# Patient Record
Sex: Male | Born: 1935
Health system: Southern US, Community
[De-identification: ages and names within clinical notes are randomized; demographics above are authoritative.]

## PROBLEM LIST (undated history)

## (undated) DIAGNOSIS — I350 Nonrheumatic aortic (valve) stenosis: Secondary | ICD-10-CM

## (undated) DIAGNOSIS — Z972 Presence of dental prosthetic device (complete) (partial): Secondary | ICD-10-CM

## (undated) DIAGNOSIS — I712 Thoracic aortic aneurysm, without rupture: Secondary | ICD-10-CM

## (undated) DIAGNOSIS — K08109 Complete loss of teeth, unspecified cause, unspecified class: Secondary | ICD-10-CM

## (undated) DIAGNOSIS — I359 Nonrheumatic aortic valve disorder, unspecified: Secondary | ICD-10-CM

## (undated) DIAGNOSIS — I38 Endocarditis, valve unspecified: Secondary | ICD-10-CM

## (undated) DIAGNOSIS — I10 Essential (primary) hypertension: Secondary | ICD-10-CM

## (undated) DIAGNOSIS — R001 Bradycardia, unspecified: Secondary | ICD-10-CM

## (undated) DIAGNOSIS — M1990 Unspecified osteoarthritis, unspecified site: Secondary | ICD-10-CM

## (undated) DIAGNOSIS — E871 Hypo-osmolality and hyponatremia: Secondary | ICD-10-CM

## (undated) DIAGNOSIS — Z8719 Personal history of other diseases of the digestive system: Secondary | ICD-10-CM

## (undated) DIAGNOSIS — D649 Anemia, unspecified: Secondary | ICD-10-CM

## (undated) DIAGNOSIS — Z862 Personal history of diseases of the blood and blood-forming organs and certain disorders involving the immune mechanism: Secondary | ICD-10-CM

## (undated) DIAGNOSIS — N280 Ischemia and infarction of kidney: Secondary | ICD-10-CM

## (undated) DIAGNOSIS — G4733 Obstructive sleep apnea (adult) (pediatric): Secondary | ICD-10-CM

## (undated) DIAGNOSIS — E785 Hyperlipidemia, unspecified: Secondary | ICD-10-CM

## (undated) DIAGNOSIS — R011 Cardiac murmur, unspecified: Secondary | ICD-10-CM

## (undated) DIAGNOSIS — T826XXA Infection and inflammatory reaction due to cardiac valve prosthesis, initial encounter: Secondary | ICD-10-CM

## (undated) HISTORY — DX: Infection and inflammatory reaction due to cardiac valve prosthesis, initial encounter: T82.6XXA

## (undated) HISTORY — DX: Thoracic aortic aneurysm, without rupture: I71.2

## (undated) HISTORY — DX: Personal history of diseases of the blood and blood-forming organs and certain disorders involving the immune mechanism: Z86.2

## (undated) HISTORY — DX: Endocarditis, valve unspecified: I38

## (undated) HISTORY — DX: Hypo-osmolality and hyponatremia: E87.1

## (undated) HISTORY — PX: JOINT REPLACEMENT: SHX530

## (undated) HISTORY — PX: TOTAL KNEE ARTHROPLASTY: SHX125

## (undated) HISTORY — PX: CARDIAC VALVE REPLACEMENT: SHX585

## (undated) HISTORY — DX: Hyperlipidemia, unspecified: E78.5

## (undated) HISTORY — PX: POSTERIOR FUSION CERVICAL SPINE: SUR628

## (undated) HISTORY — DX: Nonrheumatic aortic (valve) stenosis: I35.0

## (undated) HISTORY — DX: Unspecified osteoarthritis, unspecified site: M19.90

## (undated) HISTORY — PX: CHOLECYSTECTOMY OPEN: SUR202

## (undated) HISTORY — DX: Essential (primary) hypertension: I10

## (undated) HISTORY — DX: Personal history of other diseases of the digestive system: Z87.19

## (undated) HISTORY — DX: Bradycardia, unspecified: R00.1

## (undated) HISTORY — PX: APPENDECTOMY: SHX54

---

## 2001-12-23 ENCOUNTER — Encounter: Payer: Self-pay | Admitting: Orthopedic Surgery

## 2001-12-29 ENCOUNTER — Encounter: Payer: Self-pay | Admitting: Orthopedic Surgery

## 2001-12-29 ENCOUNTER — Inpatient Hospital Stay (HOSPITAL_COMMUNITY): Admission: RE | Admit: 2001-12-29 | Discharge: 2002-01-03 | Payer: Self-pay | Admitting: Orthopedic Surgery

## 2006-06-26 ENCOUNTER — Ambulatory Visit (HOSPITAL_COMMUNITY): Admission: RE | Admit: 2006-06-26 | Discharge: 2006-06-26 | Payer: Self-pay | Admitting: Orthopedic Surgery

## 2007-01-13 ENCOUNTER — Inpatient Hospital Stay (HOSPITAL_COMMUNITY): Admission: RE | Admit: 2007-01-13 | Discharge: 2007-01-19 | Payer: Self-pay | Admitting: Orthopedic Surgery

## 2010-08-15 ENCOUNTER — Encounter
Admission: RE | Admit: 2010-08-15 | Discharge: 2010-08-15 | Payer: Self-pay | Source: Home / Self Care | Attending: Orthopedic Surgery | Admitting: Orthopedic Surgery

## 2010-08-20 ENCOUNTER — Inpatient Hospital Stay (HOSPITAL_COMMUNITY)
Admission: RE | Admit: 2010-08-20 | Discharge: 2010-08-22 | Payer: Self-pay | Source: Home / Self Care | Attending: Orthopedic Surgery | Admitting: Orthopedic Surgery

## 2010-11-11 LAB — BASIC METABOLIC PANEL
CO2: 29 mEq/L (ref 19–32)
Calcium: 8.3 mg/dL — ABNORMAL LOW (ref 8.4–10.5)
GFR calc Af Amer: >60 mL/min (ref >60)
GFR calc non Af Amer: >60 mL/min (ref >60)
Sodium: 139 mEq/L (ref 135–145)

## 2010-11-11 LAB — CBC
Hemoglobin: 11.8 g/dL — ABNORMAL LOW (ref 13.0–17.0)
MCH: 34 pg (ref 26.0–34.0)
MCV: 92.7 fL (ref 78.0–100.0)
Platelets: 161 10*3/uL (ref 150–400)
RBC: 3.54 MIL/uL — ABNORMAL LOW (ref 4.22–5.81)
RDW: 12.6 % (ref 11.5–15.5)
WBC: 5.2 10*3/uL (ref 4.0–10.5)

## 2010-11-11 LAB — DIFFERENTIAL
Basophils Absolute: 0.1 10*3/uL (ref 0.0–0.1)
Lymphocytes Relative: 23 % (ref 12–46)
Lymphs Abs: 1.2 10*3/uL (ref 0.7–4.0)
Monocytes Absolute: 0.6 10*3/uL (ref 0.1–1.0)
Monocytes Relative: 11 % (ref 3–12)
Neutro Abs: 3 10*3/uL (ref 1.7–7.7)

## 2010-11-11 LAB — ABO/RH: ABO/RH(D): O POS

## 2010-11-11 LAB — URINALYSIS, ROUTINE W REFLEX MICROSCOPIC
Ketones, ur: NEGATIVE mg/dL
Leukocytes, UA: NEGATIVE
Nitrite: NEGATIVE
Specific Gravity, Urine: 1.015 (ref 1.005–1.030)
pH: 6.5 (ref 5.0–8.0)

## 2010-11-11 LAB — COMPREHENSIVE METABOLIC PANEL
Albumin: 3.8 g/dL (ref 3.5–5.2)
BUN: 16 mg/dL (ref 6–23)
Calcium: 9.5 mg/dL (ref 8.4–10.5)
Creatinine, Ser: 0.9 mg/dL (ref 0.4–1.5)
Total Protein: 6.8 g/dL (ref 6.0–8.3)

## 2010-11-11 LAB — URINE MICROSCOPIC-ADD ON

## 2010-11-11 LAB — SURGICAL PCR SCREEN: Staphylococcus aureus: POSITIVE — AB

## 2010-11-11 LAB — APTT: aPTT: 30 seconds (ref 24–37)

## 2011-01-17 NOTE — H&P (Signed)
NAME:  Nicholas Walls, Nicholas Walls             ACCOUNT NO.:  [id number]   MEDICAL RECORD NO.:  [id number]           PATIENT TYPE:   LOCATION:                                 FACILITY:   PHYSICIAN:  Georges Lynch. Gioffre, M.D.DATE OF BIRTH:  1936-08-19   DATE OF ADMISSION:  01/13/2007  DATE OF DISCHARGE:                              HISTORY & PHYSICAL   HISTORY OF PRESENT ILLNESS:  The patient is a 75 year old gentleman here  today for preadmission history and physical for his left knee.  The  patient has end-stage osteoarthritis.  He has a significant amount of  pain with range of motion.  He has difficulty with range of motion, he  cannot actively fully extend his knee, due to grinding and discomfort.  He is having quite a bit of pain.  He has failed cortisone injection and  conservative treatment, and would like to proceed with a left total knee  arthroplasty by Dr. Darrelyn Hillock.  X-rays reveal that he has calcific bodies  throughout the joint spaces and mostly in the suprapatellar region.  He  has bone-on-bone at the patellofemoral component with loss of medial and  lateral joint spacing.   ALLERGIES:  NO KNOWN DRUG ALLERGIES.   CURRENT MEDICATIONS:  1. Cozaar 50 mg a day.  2. Simvastatin 20 mg a day.  3. Tandem 1 tablet a day.  4. Aleve 2 tablets three times a day.  5. Buffered aspirin once a day.  6. Fish oil.   PAST MEDICAL HISTORY:  1. Hypertension.  2. Aortic valve stenosis.  3. History of hiatal hernia.  4. Reported chronic anemia.   REVIEW OF SYSTEMS:  Negative for any neurologic, pulmonary, GI, GU or  endocrine issues not noted above.   PHYSICIANS:  1. Primary care physician is Jethro Bastos, M.D.  2. Cardiologist is Rosine Abe, M.D.   FAMILY MEDICAL HISTORY:  Father is deceased from complications related  to lung cancer at 91 years of age.  Mother is deceased from  complications of throat cancer at 73 years of age.  The patient's  sisters have hypertension and  melanoma.   SOCIAL HISTORY:  The patient is married.  He is semi-retired.  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  He does not smoke.  He drinks 2-3 beers a day.  Has three grown children.  Lives with his  wife is going to take care of him afterwards, in a two-story house with  11 steps to the main entrance.   PHYSICAL EXAMINATION:  VITALS:  Height is 6 feet 3 inches, weight is  239, blood pressure is 150/72, pulse is 76 and regular, respirations are  12 and patient is afebrile.  GENERAL:  This is a healthy-appearing gentleman, conscious, alert and  appropriate, easily gets on and off the exam table.  He does walk with a  significant left-sided limp.  HEENT:  Head was normocephalic.  Pupils equal, round and reactive.  Extraocular movement is intact.  Oral buccal mucosa is pink and moist.  NECK:  Supple.  No palpable lymphadenopathy.  Good range of motion.  CHEST:  Lung sounds were clear and equal bilaterally.  No wheezes, rales  or rhonchi.  HEART:  Regular rate and rhythm.  He did have a systolic ejection  murmur.  ABDOMEN:  Soft.  Bowel sounds present.  UPPER EXTREMITIES:  Upper extremities were symmetrically sized and  shaped with good range of motion of the shoulders, elbows and wrists.  LOWER EXTREMITIES:  Right and left hip had full extension, flexion up to  130, 20 degrees internal-external rotation without any discomfort.  Right knee - he could easily fully extend and flex back to about 110  degrees.  He had no instability.  The calf was soft and nontender.  Left  knee - he had difficulty getting it up about 45 degrees short of full  extension, but passively extended to about loss of 5 degrees.  He could  flex it back to about 100 degrees.  He has significant crepitus in the  knee with range of motion.  He had no instability.  Calf was soft and  nontender.  Both ankles were symmetrical with good dorsi-plantar  flexion.  PERIPHERAL VASCULAR:  Carotid pulses  were 2+ with no bruits.  Radial  pulses were 2+.  Dorsalis pedis pulses were 2+.  He had no lower  extremity edema.  NEUROLOGIC:  The patient was conscious, alert and appropriate, a good  historian.  He had no gross neurologic defects noted.  BREAST, RECTAL AND GU:  Exams were deferred at this time.   IMPRESSION:  1. End-stage osteoarthritis, left knee.  2. Hypertension.  3. History of aortic valve stenosis.  4. History of hiatal hernia.  5. History of chronic anemia.   PLAN:  The patient has been seen by Dr. Reyes Ivan and has received cardiac  clearance; this information was forwarded to the hospital.  The patient  will undergo all other routine labs and tests prior to having a left  total knee arthroplasty by Dr. Darrelyn Hillock at [facility] on Jan 13, 2007.      Jamelle Rushing, P.A.    ______________________________  Georges Lynch Darrelyn Hillock, M.D.    RWK/MEDQ  D:  12/29/2006  T:  12/29/2006  Job:  [id number]

## 2011-01-17 NOTE — Op Note (Signed)
NAME:  Nicholas Walls, Nicholas Walls             ACCOUNT NO.:  [id number]   MEDICAL RECORD NO.:  [id number]          PATIENT TYPE:  AMB   LOCATION:  DAY                          FACILITY:  [facility]   PHYSICIAN:  Georges Lynch. Gioffre, M.D.DATE OF BIRTH:  01-Oct-1935   DATE OF PROCEDURE:  06/26/2006  DATE OF DISCHARGE:                                 OPERATIVE REPORT   SURGEON:  Georges Lynch. Gioffre, M.D.   ASSISTANT:  Nurse   PREOPERATIVE DIAGNOSIS:  1. Severe degenerative arthritis left knee.  2. Questionable loose bodies versus loose bodies affixed in the synovium      left knee.  3. Torn medial meniscus left knee.   POSTOPERATIVE DIAGNOSIS:  1. Severe degenerative arthritis left knee.  2. Loose bodies were not loose in the knee and they were affixed to the      synovium left knee.  3. Torn medial meniscus left knee.   PROCEDURE:  Under general anesthesia, routine orthopedic prepping and  draping of the left lower extremity was carried out.  She had 1 gram IV  Ancef.  At this time, a small punctate incision was made in the  suprapatellar pouch and an inflow cannula was inserted and the knee was  distended with saline.  At this time, a small punctate incision was made in  the anterolateral joint.  The arthroscope was entered from the lateral  approach and a complete diagnostic arthroscopy was carried out.  He had  severe synovitis of the suprapatellar pouch.  I introduced the reduced the  [employer] Bovie and literally up bladed the synovium.  I did look for loose  bodies, there really were no real loose bodies in the joint.  These actually  were in the synovium.  I then noted severe chondromalacia of the patella,  came down the lateral joint.  He had a torn lateral meniscus.  I had to do a  lateral meniscectomy.  I introduced a shaver suction device from the lateral  approach.  He had severe arthritic changes in the lateral joint.  I also did  a synovectomy.  The cruciates were intact.  I went over  to the medial joint.  The medial joint showed that he basically had a severe tear of the medial  meniscus posterior horn.  I introduced a shaver suction device and did a  medial meniscectomy.  I also did a synovectomy, as well.  I did an abrasion  chondroplasty of the medial femoral condyle.  I thoroughly irrigated out the  knee and closed all three punctate incisions with 3-0 nylon suture.  I  injected 30 mL 0.5% Marcaine and epinephrine in the knee joint.  A sterile  Neosporin dressing was applied.  Prior to surgery, he had 1 gram of IV  Ancef.  Postoperative, he will get another gram of IV Ancef before he is  discharged because of the total knee on the opposite side.  I will also put  him on aspirin 325 mg b.i.d. today and for two weeks as an anticoagulant.  He will be on crutches, partial weight bearing as tolerated, and I will  see  him in 10-12 days in the office.  He eventually is going to need a left  total knee.           ______________________________  [name], M.D.    RAG/MEDQ  D:  06/26/2006  T:  06/27/2006  Job:  [id number]

## 2011-01-17 NOTE — Discharge Summary (Signed)
NAME:  Nicholas Walls, Nicholas Walls             ACCOUNT NO.:  [id number]   MEDICAL RECORD NO.:  [id number]          PATIENT TYPE:  INP   LOCATION:  1617                         FACILITY:  [facility]   PHYSICIAN:  Georges Lynch. Gioffre, M.D.DATE OF BIRTH:  September 21, 1935   DATE OF ADMISSION:  01/13/2007  DATE OF DISCHARGE:  01/19/2007                               DISCHARGE SUMMARY   ADMISSION DIAGNOSES:  1. End-stage osteoarthritis left knee.  2. Hypertension.  3. History of aortic valve stenosis.  4. History of hiatal hernia.  5. History of chronic anemia.   DISCHARGE DIAGNOSES:  1. Left total knee arthroplasty.  2. Postoperative blood loss anemia allowed to self-correct with oral      supplements, no blood transfusions.  3. Mild hyponatremia, allowed to self-correct, asymptomatic.  4. Severe postsurgical hemarthrosis with superficial skin pressure      blisters, treated with intravenous antibiotics.  5. History of hypertension.  6. History of aortic valve stenosis.  7. History of hiatal hernia.  8. History of chronic anemia.   HISTORY OF PRESENT ILLNESS:  The patient is a 75 year old gentleman with  chronic issues related to his left knee due to severe arthritis.  The  patient has end-stage osteoarthritis on x-rays, near bone-on-bone  throughout.  He has painful range of motion.  The patient indicates he  would like to proceed with a total knee arthroplasty.   ALLERGIES:  No known drug allergies.   CURRENT MEDICATIONS:  1,  Cozaar 50 mg a day.  1. Simvastatin 20 mg a day.  2. Tandem one tablet a day.  3. Aleve two tablets three times a day p.r.n.  4. Buffered aspirin 81 mg a day.  5. Fish oil.   SURGICAL PROCEDURES:  On Jan 13, 2007, the patient was taken to the OR  by Dr. Worthy Rancher, assisted by Oneida Alar, PA-C.  Under general  anesthesia the patient underwent a left total knee arthroplasty.  There  were no complications.  The patient tolerated the procedure well.  He  had the following  components implanted:  A size 4 left femoral  component, a size 4 keeled tibial tray, a size 4 10-mm polyethylene  bearing, a size 41-mm three-peg patella.  All components were implanted  with polymethyl methacrylate and vancomycin impregnation.  The patient  was transferred to the recovery room for routine total knee protocol.   CONSULTS:  Routine consults requested:  Physical therapy, case  management, pharmacy for Coumadin dosing.   HOSPITAL COURSE:  On Jan 13, 2007, the patient was admitted to [facility] under the care of Dr. Worthy Rancher.  The patient was taken  to the OR where a left total knee arthroplasty was performed without any  complications.  The patient tolerated the procedure well, was  transferred to room and then to the orthopedic floor in good condition  to follow the total knee protocol with IV antibiotics, pain medications,  and Coumadin and heparin for DVT prophylaxis.  The patient spent about a  5-day postoperative course in which the patient did develop some mild  postoperative blood loss anemia  with a significant hemarthrosis and  pressure blisters about knee.  Due to this problem, the patient was  restarted on IV antibiotics and then placed on p.o. antibiotics to go  home to prevent any superficial cellulitis around a new total knee  arthroplasty.  The patient continued to work with physical therapy.  He  was allowed to have his hemoglobin correct with just p.o. supplements.  His vital signs remained stable.  He tolerated it well.  The patient  also developed some mild postoperative hyponatremia.  This was self-  corrected after discontinuing hydration with IV antibiotics.  The  patient was asymptomatic.  The patient's wound otherwise remained benign  for any signs of infection.  He did have a significant amount of  pressure blisters about the knee that were dressed on a daily basis to  prevent further infection, along with p.o. antibiotics.  The  patient  continued with physical therapy.  It was felt on postoperative day #6  that he was orthopedically and medically stable, ready for discharge  home.  He was discharged home in good condition with outpatient physical  therapy.   LABORATORIES:  CBC on admission found wbc's 4.6, hemoglobin 14.9,  hematocrit 42.7, platelets 176.  On Jan 15, 2007, wbc's were 7.8,  hemoglobin 9.9, hematocrit 28.1, platelets 112.  The patient was  tolerating it well with vital signs and activity level with p.o.  supplements to self-correct.  INR was 3.2 on May 20 and this was  corrected with adjustments of his Coumadin dosing.  This possibly could  have contributed to his significant hemarthrosis.  Routine chemistries  on May 16 found sodium 133, potassium of 3.8, glucose 115, BUN 10,  creatinine 0.89.  Estimated GFR was greater than 60 throughout his  hospitalization.  Urinalysis on admission was no signs of bacteria.  Chest x-ray on admission found stable cardiomegaly and COPD.   DISCHARGE INSTRUCTIONS:  1. Activity:  The patient can slowly increase activity with the use of      a walker, may weight-bear as tolerated.  2. Diet:  No restrictions.  3. Wound care:  The patient is to change his dressing daily with      Adaptic dressings as directed.   FOLLOWUP:  The patient needs a followup appointment Dr. Darrelyn Hillock in 2  weeks from date of discharge from hospital.  The patient is to call [phone] for this appointment.   MEDICATIONS:  1. Percocet 10/650 one tablet every 4-6 hours for pain if needed.  2. Robaxin 500 mg one tablet every 6 hours for muscle spasms if      needed.  3. Coumadin 5 mg one tablet a day as directed by the pharmacy.  4. Keflex 500 mg one tablet four times a day.  5. Cozaar 50 mg a day.  6. Simvastatin 20 mg once a day.  7. Tandem one tablet once a day.  8. Aspirin 325 mg to resume after completed Coumadin, which will be     for 30 days from surgery.  9. Aleve - he is not  to continue at this time.   CONDITION UPON DISCHARGE TO HOME:  Listed as improved and good.      Jamelle Rushing, P.A.    ______________________________  Georges Lynch Darrelyn Hillock, M.D.    RWK/MEDQ  D:  01/27/2007  T:  01/27/2007  Job:  [id number]

## 2011-01-17 NOTE — Discharge Summary (Signed)
[facility]  Patient:    DEMBA, NIGH Visit Number: [id number] MRN: [id number]          Service Type: SUR Location: 4W 0467 01 Attending Physician:  Skip Mayer Dictated by:   Alexzandrew L. Perkins, P.A.-C. Admit Date:  12/29/2001 Discharge Date: 01/03/2002                             Discharge Summary  ADMISSION DIAGNOSES: 1. Right knee osteoarthritis. 2. Mild sleep apnea.  DISCHARGE DIAGNOSES: 1. Degenerative arthritis, right knee, with genu varus deformity status post    right total knee replacement arthroplasty. 2. Right knee osteoarthritis. 3. Mild sleep apnea.  PROCEDURES:  The patient was taken to OR on December 29, 2001, and underwent a right total knee replacement arthroplasty.  The surgeon was Georges Lynch. Darrelyn Hillock, M.D., assistant was Javier Docker, M.D.  Components used were Osteonics. All were cemented.  Patella size 26, femur size 9 right, tibial tray size 9, tibial insert 10 mm thickness.  General anesthesia.  CONSULTATIONS:  None.  BRIEF HISTORY:  The patient is a 75 year old male who had progressive pain and deformity affecting his right knee for several years now.  The pain has been quite significant and progressed to the point where it has interfered with his daily activity.  He is quite active and wishes to continue with his active lifestyle.  It was felt he would benefit from a total knee replacement.  Risks and benefits have been discussed, and the patient has elected to proceed with surgery.  LABORATORY DATA:  CBC on admission: Hemoglobin 14.8, hematocrit 41.4, white blood cell count 4.2, red cell count 4.49, differential within normal limits with the exception of slight elevation in eosinophils of 8.  Postop hemoglobin 12.9, hematocrit 36.3; last noted hemoglobin 12.6, hematocrit 35.1.  PT/PTT on admission 30.7 and 31, respectively with INR 1.1.  Pro time will be followed according to Coumadin protocol.   Last noted PT and INR were therapeutic at 23.4 with an INR of 2.4.  Chemistry panel on admission all within normal limits.  Urinalysis was negative.  Blood group type O positive.  Preoperative chest x-ray showed COPD and cardiomegaly, enlargement of central pulmonary arteries noted, likely on the basis of underlying pulmonary hypertension.  No acute findings.  Right knee film shows severe osteoarthritis with near complete loss of medial joint space.  In standing position, there is also evidence of patella alta and patellofemoral disease.  Postop knee films: Good position right total knee prosthesis in AP and lateral projections.  No evidence of acute fracture.  Single large superolateral surgical drain.  EKG dated December 22, 2001: Normal sinus rhythm, left atrial enlargement, no old tracing to compare.  Confirmed by Daisey Must, M.D.  HOSPITAL COURSE:  The patient was admitted to [facility] and taken the OR where he underwent the above-stated procedure without complication. The patient tolerated the procedure well and was later transferred to the recovery room and to the floor for continued postoperative care.  Hemovac drain was placed at the time of surgery.  The patient was placed on heparin and Coumadin postop.  The patient was given 48 hours postop antibiotics, placed on weightbearing-as-tolerated status.  The Hemovac drain was pulled on postop day #1 without difficulty.  CPM was started postop, and patient underwent PT and OT evaluation for continued protocol.  The patient progressed fairly well with physical therapy, up ambulating  18 feet by postop day #1 and progressed to greater than 110 feet by postop day #4.  Dressing changes were initiated on postop day #2.  Wound was healing well.  The patient had some swelling in and about the distal incision with only some minimal erythema.  There were no signs of obvious infection. Staples were intact.  The patient  continued to progress very well up until Jan 03, 2002, postop day #5.  The patient was up ambulating well, had been weaned off PCA analgesics and over to p.o. analgesics.  IV was discontinued.  He was voiding well, and it was thought the patient could be discharged home.  DISCHARGE PLAN: 1. The patient was discharged home on Jan 03, 2002. 2. For Discharge Diagnoses, please see above.  DISCHARGE MEDICATIONS: 1. Colace 100 mg, #60, b.i.d. 2. Trinsicon, #60, b.i.d. 3. Coumadin as per pharmacy protocol. 4. Percocet 5 mg, #40, 1 or 2 every 4 to 6 hours p.r.n. pain. 5. Robaxin 500 mg 1 every 6 to 8 hours p.r.n. spasm. 6. Keflex 500 mg, #20, p.o. q.i.d. for 5 days.  DIET:   As tolerated.  ACTIVITY:  Weightbearing as tolerated to right lower extremity.  Genevieve Norlander for home health PT and home health nursing and total knee protocol.  WOUND CARE:  Daily dressing changes.  FOLLOWUP:  With Dr. Darrelyn Hillock in the office two weeks from surgery.  DISPOSITION:  Home.  CONDITION UPON DISCHARGE:  Improved. Dictated by:   Alexzandrew L. Perkins, P.A.-C. Attending Physician:  Skip Mayer DD:  01/03/02 TD:  01/05/02 Job: [id number]

## 2011-01-17 NOTE — Op Note (Signed)
[facility]  Patient:    Nicholas Walls, Nicholas Walls Visit Number: [id number] MRN: [id number]          Service Type: SUR Location: 1S X002 01 Attending Physician:  Skip Mayer Dictated by:   Georges Lynch Darrelyn Hillock, M.D. Proc. Date: 12/29/01 Admit Date:  12/29/2001                             Operative Report  SURGEON:  Windy Fast A. Darrelyn Hillock, M.D.  ASSISTANT:  Javier Docker, M.D.  PREOPERATIVE DIAGNOSIS:  Severe degenerative arthritis, right knee with a genu varus deformity.  POSTOPERATIVE DIAGNOSIS:  Severe degenerative arthritis, right knee with a genu varus deformity.  OPERATION:  Right total knee arthroplasty utilizing the Osteonics system.  I utilized the posterior cruciate-sacrificing prosthesis.  All three components were cemented.  The sizes used were as follows: 1. The patella was a size 26. 2. The femur was a size 9, right. 3. The tibial tray was a size 9. 4. The tibial insert was a size 10 mm thickness insert.  DESCRIPTION OF PROCEDURE:  Under general anesthesia, a routine orthopedic prep and draping of the right lower extremity was carried out.  The leg was exsanguinated with an Esmarch.  Tourniquet was elevated at 350 mmHg.  An incision was made over the anterior aspect of the right knee, bleeders identified and cauterized.  Two flaps were created and sutured down in place. I then carried out a right median parapatellar incision.  I reflected the patella laterally, flexed the knee, and did medial and medial and lateral meniscectomies, and removed the anterior and posterior cruciate ligaments. Following this, I did a synovectomy.  I flexed the knee, made my initial drill hole in the intercondylar notch and the guide rod was inserted and, at this time, 10 mm thickness was removed from the distal femur.  The size 9 jig was then utilized to make our anterior, posterior, and chamfering cuts for a size 9 right femur.  Once the femur was  prepared, we then prepared the tibial plateau.  We removed 6 mm thickness off the tibial plateau.  We did this my utilizing intramedullary guide rod.  Following this, we removed 26 mm thickness off the patella.  For the size 26 patella, we made a drill hole and removed approximately 10 mm thickness from the internal portion of the patella.  Three drill holes then were made.  After that, we flexed the knee, and we cut our keel cut.  Note, that prior to doing that, we went through our trials and felt that a size 9 right femur, a size 9 tibial tray, and 10 mm thickness was our most stable.  We then thoroughly irrigated out the knee, cut our keel cut.  We then dried the knee out and then inserted all three components in simultaneously.  We cemented all three components.  We went through trials again and felt that a 10 mm thickness trial was the most stable.  Following that, we then removed the trial, removed all loose pieces of cement, bone wax to raw bone ends.  Note, I did utilize Gelfoam down into the femur and up into the femoral canal and down in the tibial plateau region to prevent the cement from migrating.  We then thoroughly water-picked the knee out, and we inserted about 16 cc of 0.5% Marcaine with epinephrine into the knee joint, closed the wound in layers in the usual  fashion over a Hemovac drain.  Sterile dressings were applied.  He had 1 g of IV Ancef preop. Dictated by:   Georges Lynch Darrelyn Hillock, M.D. Attending Physician:  Skip Mayer DD:  12/29/01 TD:  12/29/01 Job: [id number]

## 2011-01-17 NOTE — H&P (Signed)
[facility]  Patient:    Nicholas Walls, Nicholas Walls Visit Number: [id number] MRN: [id number]          Service Type: Attending:  Georges Walls. Nicholas Walls, M.D. Dictated by:   Nicholas Walls, P.A.                           History and Physical  DATE OF BIRTH:  [birth year].  CHIEF COMPLAINT:  Right knee pain.  HISTORY OF PRESENT ILLNESS:  Nicholas Walls is a 75 year old male who has had progressive pain and deformity affecting his right knee for several years duration.  He is now having significant pain that affects his activities of daily living.  He is quite active and has considered a total knee and is actually ready for it at this time.  His insurance company recently changed; therefore, he has been referred down to our practice.  His daughter actually lives in this city.  At this time risks, benefits discussed with the patient at length for right total knee arthroplasty, and he wished to proceed.  PAST MEDICAL HISTORY:  He has a history of mild sleep apnea, not currently using the CPAP machine as he does not tolerate this.  PAST SURGICAL HISTORY:  Cholecystectomy.  SOCIAL HISTORY:  No tobacco, no alcohol use except for very remotely.  He lives in a one-level home.  Has a wife to assist postoperatively.  FAMILY HISTORY:  Significant for grandfather with diabetes, grandfather with heart disease, and father died in his sleep of suspected MI.  PRIMARY PHYSICIAN:  Primary physician is in [place].  REVIEW OF SYSTEMS:  GENERAL:  He denies any recent fevers, chills, night sweats.  He has had a recent cellulitis to his left ear, which has cleared. RESPIRATORY:  No shortness of breath, productive cough, or hemoptysis. CARDIOVASCULAR:  No chest pain, angina, or orthopnea.  GASTROINTESTINAL:  No nausea, vomiting, constipation, melena, or bloody stools.  GENITOURINARY:  No dysuria or hematuria.  MUSCULOSKELETAL:  As pertinent to present illness.  PHYSICAL  EXAMINATION:  VITAL SIGNS:  Pulse 72 and regular, respirations 12, and blood pressure is 164/90.  GENERAL:  A well-developed, well-nourished 75 year old male.  HEENT:  Normocephalic, atraumatic.  Extraocular movements intact.  NECK:  No lymphadenopathy, no carotid bruits appreciated on exam.  CHEST:  Clear to auscultation bilaterally.  No rales or rhonchi.  CARDIAC:  Regular rate and rhythm.  No murmurs, gallops, rubs, heaves, or thrills.  ABDOMEN:  Positive bowel sounds, soft, nontender.  EXTREMITIES:  Positive pulses.  He does have about 1+ pitting edema diffusely, and he has varicosities to his lower extremities.  IMPRESSION: 1. Right knee osteoarthritis. 2. Mild sleep apnea.  PLAN:  Right total knee arthroplasty. Dictated by:   Nicholas Walls, P.A. Attending:  Georges Walls. Nicholas Walls, M.D. DD:  12/23/01 TD:  12/24/01 Job: [id number]

## 2011-01-17 NOTE — Op Note (Signed)
NAME:  Nicholas Walls, Nicholas Walls             ACCOUNT NO.:  [id number]   MEDICAL RECORD NO.:  [id number]          PATIENT TYPE:  INP   LOCATION:  0001                         FACILITY:  [facility]   PHYSICIAN:  Georges Lynch. Gioffre, M.D.DATE OF BIRTH:  1935/12/24   DATE OF PROCEDURE:  01/13/2007  DATE OF DISCHARGE:                               OPERATIVE REPORT   PREOPERATIVE DIAGNOSIS:  Severe degenerative arthritis with multiple  loose bodies in the left knee.   POSTOPERATIVE DIAGNOSIS:  Severe degenerative arthritis with multiple  loose bodies in the left knee.   OPERATION:  1. Left total knee arthroplasty utilizing the DePuy rotating platform      system.  All three components were cemented and vancomycin was used      in the cement.  The sizes used was a size 4 left posterior cruciate      sacrificing type femoral component.  The tibial tray was a size 4.      The insert was a size 4 10 mm thickness.  The patella was a size 41      mm three pegged patella.   PROCEDURE:  Under general anesthesia, routine orthopedic prep and drape  of the left lower extremity carried out.  He had 2 grams of IV Ancef  preop.  At this time the leg was exsanguinated with the Esmarch, the  tourniquet was elevated 375 mmHg.  The knee was flexed and incision was  made over the anterior aspect of the left knee.  Bleeders identified and  cauterized.  Following that we created two flaps and then inserted self-  retaining retractors.  I then carried out a median parapatellar incision  reflecting patella laterally, flexed the knee and did medial and lateral  meniscectomies and excised the anterior and posterior cruciate  ligaments.  I did a nice release of the capsule posteriorly, also did a  extensive synovectomy.  He had very large loose bodies that literally  were adhered to the synovium with a severe chronic synovitis.  I had to  do an extensive synovectomy in the suprapatellar pouch to remove these.  Following that  we then made our initial drill hole in the intercondylar  notch, inserted a guide rod and 12 mm thickness was removed the distal  femur because of his severe flexion contracture that he had.  Following  that, #2 jig was inserted and we made the appropriate anterior posterior  and chamfer cuts for a size 4 femur, left femur.  After that we then  prepared the tibia in the usual fashion.  We made our initial drill hole  in the tibial plateau inserted our guide rod and removed 4 mm thickness  off of the affected medial side of the tibia.  Following that we cut our  keel cut out of the tibia in the usual fashion.  We then went back, cut  our notch cut out of the femur.  We then inserted our trial components  went through range of motion.  I first tried a 12.5 mm thickness insert  but the knee still would not extend, so we went  to a 10 mm thickness  insert.  We had good extension, good flexion, good stability in the  medial lateral planes.  Following that, the patella then was prepared  the usual fashion.  We did a resurfacing type procedure in the patella.  Patella measured out to be a 41-mm patella.  Three drill holes were made  in the articular surface of the patella.  We then inserted our trial  component fit anatomically.  Following that we removed all trial  components, thoroughly water picked out the knee.  We inserted some  Gelfoam up into the femoral canal and then down in the tibial canal as  cement plugs.  Following that we then cemented all three components in  simultaneously utilizing vancomycin in the cement.  We then removed all  loose pieces cement.  We checked for loose pieces cement.  All pieces  cement removed.  We thoroughly water picked out the knee as well.  We  finally selected a rotating platform size 4, 10 mm thickness, inserted  that and reduced the knee, took knee through motion, had excellent  stability.  Prior to inserting the permanent tibial insert.  We injected   20 mL of 0.25% Marcaine with Toradol in the soft tissue with epinephrine  and Toradol in the soft tissue structures for pain relief purposes as  well as in order to prevent any bleeding.  We then inserted FloSeal into  the wound site and then inserted Hemovac drain and closed the knee in  layers usual fashion.  Skin was closed metal staples.  Sterile Neosporin  dressing was applied.  The patient left the OR room in satisfactory  condition.   SURGEON:  Georges Lynch. Darrelyn Hillock, M.D.   ASSISTANT:  Arlyn Leak, PA.           ______________________________  Georges Lynch. Darrelyn Hillock, M.D.     RAG/MEDQ  D:  01/13/2007  T:  01/13/2007  Job:  [id number]

## 2011-04-18 ENCOUNTER — Other Ambulatory Visit: Payer: Self-pay | Admitting: *Deleted

## 2011-04-18 DIAGNOSIS — I350 Nonrheumatic aortic (valve) stenosis: Secondary | ICD-10-CM

## 2011-04-24 ENCOUNTER — Other Ambulatory Visit (HOSPITAL_COMMUNITY): Payer: Self-pay

## 2011-05-15 ENCOUNTER — Other Ambulatory Visit (HOSPITAL_COMMUNITY): Payer: Self-pay

## 2011-05-29 ENCOUNTER — Ambulatory Visit (HOSPITAL_COMMUNITY): Payer: Medicare Other | Attending: Cardiology

## 2011-05-29 DIAGNOSIS — I359 Nonrheumatic aortic valve disorder, unspecified: Secondary | ICD-10-CM

## 2011-05-29 DIAGNOSIS — I379 Nonrheumatic pulmonary valve disorder, unspecified: Secondary | ICD-10-CM | POA: Insufficient documentation

## 2011-05-29 DIAGNOSIS — I079 Rheumatic tricuspid valve disease, unspecified: Secondary | ICD-10-CM | POA: Insufficient documentation

## 2011-05-29 DIAGNOSIS — I350 Nonrheumatic aortic (valve) stenosis: Secondary | ICD-10-CM

## 2011-05-29 DIAGNOSIS — I080 Rheumatic disorders of both mitral and aortic valves: Secondary | ICD-10-CM | POA: Insufficient documentation

## 2011-06-02 ENCOUNTER — Telehealth: Payer: Self-pay | Admitting: *Deleted

## 2011-06-02 NOTE — Progress Notes (Signed)
Advised patient

## 2011-06-02 NOTE — Telephone Encounter (Signed)
Message copied by Burnell Blanks on Mon Jun 02, 2011  8:22 AM ------      Message from: Cassell Clement      Created: Fri May 30, 2011  2:46 PM       Please report.  The echocardiogram shows that the aortic stenosis has progressed in severity since last echo.  We want to see the patient soon for office visit and EKG to discuss further.

## 2011-06-02 NOTE — Telephone Encounter (Signed)
Advised patient and will set up an appointment

## 2011-06-25 ENCOUNTER — Encounter: Payer: Self-pay | Admitting: *Deleted

## 2011-06-25 DIAGNOSIS — I350 Nonrheumatic aortic (valve) stenosis: Secondary | ICD-10-CM | POA: Insufficient documentation

## 2011-06-25 DIAGNOSIS — M1990 Unspecified osteoarthritis, unspecified site: Secondary | ICD-10-CM | POA: Insufficient documentation

## 2011-07-01 ENCOUNTER — Encounter: Payer: Self-pay | Admitting: Cardiology

## 2011-07-01 ENCOUNTER — Ambulatory Visit (INDEPENDENT_AMBULATORY_CARE_PROVIDER_SITE_OTHER): Payer: Medicare Other | Admitting: Cardiology

## 2011-07-01 VITALS — BP 124/78 | HR 62 | Ht 74.0 in | Wt 218.0 lb

## 2011-07-01 DIAGNOSIS — G4730 Sleep apnea, unspecified: Secondary | ICD-10-CM

## 2011-07-01 DIAGNOSIS — I359 Nonrheumatic aortic valve disorder, unspecified: Secondary | ICD-10-CM

## 2011-07-01 DIAGNOSIS — I350 Nonrheumatic aortic (valve) stenosis: Secondary | ICD-10-CM | POA: Insufficient documentation

## 2011-07-01 NOTE — Assessment & Plan Note (Signed)
His sleep apnea is severe.  He snores and his wife states that he stops breathing at times.  He has been worked up at a sleep center, but refuses CPAP machine.  We talked about how the sleep apnea might provoke cardiac arrhythmias, which would possibly cause his aortic stenosis to deteriorate quickly.

## 2011-07-01 NOTE — Assessment & Plan Note (Signed)
The patient now has severe aortic stenosis.  His electrocardiogram today shows voltage for left ventricular hypertrophy, but he does not demonstrate any left ventricular strain pattern.  He is now having symptoms of exertional dyspnea.  The patient himself tends to minimize his symptoms, but his wife indicates that his exertional dyspnea is significant.  He has reached a point in the natural history of his aortic stenosis that I feel aortic valve replacement would be beneficial.  We will refer  for left heart catheterization in anticipation of valve replacement with a tissue valve.

## 2011-07-01 NOTE — Patient Instructions (Signed)
Will schedule appointment for pre cath visit with Dr Swaziland.  Will call you with appointment.  Will then proceed with arranging appointment with surgeon  Your physician recommends that you continue on your current medications as directed. Please refer to the Current Medication list given to you today.

## 2011-07-01 NOTE — Progress Notes (Signed)
Nicholas Walls Date of Birth:  [birth year] [facility] [address]   [address] [place], [place]  [address] [phone]           Fax   [phone]  History of Present Illness: This pleasant 75 year old gentleman is seen in followup office visit.  Has a history of aortic stenosis.  His heart murmur was first noticed in 2006 by his family doctor, who sent him to Dr. Reyes Ivan, who did an echocardiogram.  The echocardiogram in 2006 showed mild to moderate aortic stenosis with a peak gradient of 25 and a mean gradient of 15 and a calculated aortic valve area of 1.6 cm square.  He also had mild aortic insufficiency.  Subsequent echocardiograms have shown progression of his aortic stenosis.  On 11/06/09.  His peak gradient was 55 with a mean gradient of 33 and a calculated aortic valve area of 0.8.  5.  His most recent echocardiogram formed on 05/29/11 showed a peak gradient of 64 and a mean gradient of 38.  The patient does not have any history of known ischemic heart disease.  He had a normal treadmill Cardiolite stress test in 2007 as preop for knee surgery.  The patient has not been experiencing any exertional chest pain.  He does have exertional dyspnea.  If he has to carrying groceries from the car.  He is extremely short of breath.  He sleeps on one pillow and has not had orthopnea, or paroxysmal nocturnal dyspnea.  He is not having any peripheral edema.  He has not had any exertional dizziness, or syncope.  He's not had any history of palpitations.  He does have a history of sleep apnea, but does not use a CPAP machine, which he did not tolerate.  Current Outpatient Prescriptions  Medication Sig Dispense Refill  . aspirin 81 MG tablet Take 81 mg by mouth daily.        . ferrous fumarate-iron polysaccharide complex (TANDEM) 162-115.2 MG CAPS Take 1 capsule by mouth daily with breakfast.        . losartan (COZAAR) 50 MG tablet Take 50 mg by mouth daily.        . naproxen  sodium (ANAPROX) 220 MG tablet Take 220 mg by mouth 3 (three) times daily with meals. Takes 3 daily       . simvastatin (ZOCOR) 20 MG tablet Take 20 mg by mouth at bedtime.          No Known Allergies  Patient Active Problem List  Diagnoses  . Aortic stenosis, moderate  . Osteoarthritis    History  Smoking status  . Former Smoker  Smokeless tobacco  . Not on file    History  Alcohol Use     Family History  Problem Relation Age of Onset  . Cancer Mother     throat  . Heart failure Father     Review of Systems: Constitutional: no fever chills diaphoresis or fatigue or change in weight.  Head and neck: no hearing loss, no epistaxis, no photophobia or visual disturbance. Respiratory: No cough, shortness of breath or wheezing. Cardiovascular: No chest pain peripheral edema, palpitations. Gastrointestinal: No abdominal distention, no abdominal pain, no change in bowel habits hematochezia or melena. Genitourinary: No dysuria, no frequency, no urgency, no nocturia. Musculoskeletal:No arthralgias, no back pain, no gait disturbance or myalgias. Neurological: No dizziness, no headaches, no numbness, no seizures, no syncope, no weakness, no tremors. Hematologic: No lymphadenopathy, no easy bruising. Psychiatric: No  confusion, no hallucinations, no sleep disturbance.    Physical Exam: Filed Vitals:   07/01/11 1640  BP: 124/78  Pulse: 62   General appearance reveals a well-developed, well-nourished, gentleman in no distress.Pupils equal and reactive.   Extraocular Movements are full.  There is no scleral icterus.  The mouth and pharynx are normal.  The neck is supple.  He has a slow upstroke to his carotid pulse.  His heart murmur is audible in the carotids.  The jugular venous pressure is normal.  The thyroid is not enlarged.  There is no lymphadenopathy. The chest is clear to percussion and auscultation. There are no rales or rhonchi. Expansion of the chest is  symmetrical.  The heart reveals a grade 2/6 musical systolic ejection murmur audible at the base and apex.  I don't appreciate any diastolic murmur.  There is no gallop or rub.  He has a somewhat barrel chest and I don't feel any left ventricular heave. The abdomen is soft and nontender. Bowel sounds are normal. The liver and spleen are not enlarged. There Are no abdominal masses. There are no bruits.  The pedal pulses are good.  There is no phlebitis or edema.  There is no cyanosis or clubbing. Strength is normal and symmetrical in all extremities.  There is no lateralizing weakness.  There are no sensory deficits.  The skin is warm and dry.  There is no rash.  EKG shows normal sinus rhythm with left ventricular hypertrophy and poor R wave progression V1 through V3    Assessment / Plan:  We will refer the patient for left heart cardiac catheterization in preparation for anticipated aortic valve replacement

## 2011-09-02 HISTORY — PX: CARDIAC CATHETERIZATION: SHX172

## 2011-09-04 ENCOUNTER — Ambulatory Visit
Admission: RE | Admit: 2011-09-04 | Discharge: 2011-09-04 | Disposition: A | Discharge status: Home / Self Care | Payer: Medicare Other | Source: Ambulatory Visit | Attending: Cardiology | Admitting: Cardiology

## 2011-09-04 ENCOUNTER — Encounter: Payer: Self-pay | Admitting: Cardiology

## 2011-09-04 ENCOUNTER — Encounter: Payer: Medicare Other | Admitting: *Deleted

## 2011-09-04 ENCOUNTER — Ambulatory Visit (INDEPENDENT_AMBULATORY_CARE_PROVIDER_SITE_OTHER): Payer: Medicare Other | Admitting: Cardiology

## 2011-09-04 ENCOUNTER — Ambulatory Visit: Payer: Self-pay

## 2011-09-04 DIAGNOSIS — I714 Abdominal aortic aneurysm, without rupture: Secondary | ICD-10-CM

## 2011-09-04 DIAGNOSIS — I700 Atherosclerosis of aorta: Secondary | ICD-10-CM

## 2011-09-04 DIAGNOSIS — I359 Nonrheumatic aortic valve disorder, unspecified: Secondary | ICD-10-CM

## 2011-09-04 DIAGNOSIS — I712 Thoracic aortic aneurysm, without rupture: Secondary | ICD-10-CM

## 2011-09-04 DIAGNOSIS — R198 Other specified symptoms and signs involving the digestive system and abdomen: Secondary | ICD-10-CM

## 2011-09-04 DIAGNOSIS — R079 Chest pain, unspecified: Secondary | ICD-10-CM

## 2011-09-04 DIAGNOSIS — I350 Nonrheumatic aortic (valve) stenosis: Secondary | ICD-10-CM

## 2011-09-04 DIAGNOSIS — IMO0001 Reserved for inherently not codable concepts without codable children: Secondary | ICD-10-CM

## 2011-09-04 DIAGNOSIS — Z5181 Encounter for therapeutic drug level monitoring: Secondary | ICD-10-CM

## 2011-09-04 NOTE — Patient Instructions (Signed)
Your physician recommends that you return for lab work in: 09/16/11 can walk in between 8:30 am and 12:00  A chest x-ray takes a picture of the organs and structures inside the chest, including the heart, lungs, and blood vessels. This test can show several things, including, whether the heart is enlarges; whether fluid is building up in the lungs; and whether pacemaker / defibrillator leads are still in place.  Your physician has requested that you have an abdominal aorta duplex. During this test, an ultrasound is used to evaluate the aorta. Allow 30 minutes for this exam. Do not eat after midnight the day before and avoid carbonated beverages

## 2011-09-07 NOTE — Progress Notes (Signed)
Nicholas Walls Date of Birth:  02-04-1936 [facility] / [facility] [address]   [address] [place], [place]  [address] [phone]           Fax   [phone]  History of Present Illness: This pleasant 76 year old gentleman is seen at the request of Dr. Patty Sermons for evaluation for cardiac catheterization.  Has a history of aortic stenosis.  His heart murmur was first noticed in 2006 by his family doctor, who sent him to Dr. Reyes Ivan, who did an echocardiogram.  The echocardiogram in 2006 showed mild to moderate aortic stenosis with a peak gradient of 25 and a mean gradient of 15 and a calculated aortic valve area of 1.6 cm square.  He also had mild aortic insufficiency.  Subsequent echocardiograms have shown progression of his aortic stenosis.  On 11/06/09.  His peak gradient was 55 with a mean gradient of 33 and a calculated aortic valve area of 0.8.  5.  His most recent echocardiogram formed on 05/29/11 showed a peak gradient of 64 and a mean gradient of 38.  The patient does not have any history of known ischemic heart disease.  He had a normal treadmill Cardiolite stress test in 2007 as preop for knee surgery.  The patient has not been experiencing any exertional chest pain.  He does have exertional dyspnea.  If he has to carrying groceries from the car.  He is extremely short of breath.  He sleeps on one pillow and has not had orthopnea, or paroxysmal nocturnal dyspnea.  He is not having any peripheral edema.  He has not had any exertional dizziness, or syncope.  He's not had any history of palpitations.  He does have a history of sleep apnea, but does not use a CPAP machine, which he did not tolerate.  Current Outpatient Prescriptions  Medication Sig Dispense Refill  . aspirin 81 MG tablet Take 81 mg by mouth daily.        . ferrous fumarate-iron polysaccharide complex (TANDEM) 162-115.2 MG CAPS Take 1 capsule by mouth daily with breakfast.        . fish oil-omega-3 fatty acids  1000 MG capsule Take 2 g by mouth daily.        [name] losartan (COZAAR) 50 MG tablet Take 50 mg by mouth daily.        . naproxen sodium (ANAPROX) 220 MG tablet Take 220 mg by mouth 3 (three) times daily with meals. Takes 3 daily       . simvastatin (ZOCOR) 20 MG tablet Take 20 mg by mouth at bedtime.          No Known Allergies  Patient Active Problem List  Diagnoses  . Osteoarthritis  . Aortic stenosis, severe  . Sleep apnea    History  Smoking status  . Former Smoker  Smokeless tobacco  . Not on file    History  Alcohol Use     Family History  Problem Relation Age of Onset  . Cancer Mother     throat  . Heart failure Father   . Cancer Father     Lung Cancer  . Hypertension Sister     Review of Systems: Constitutional: no fever chills diaphoresis or fatigue or change in weight.  Head and neck: no hearing loss, no epistaxis, no photophobia or visual disturbance. Respiratory: No cough, shortness of breath or wheezing. Cardiovascular: No chest pain peripheral edema, palpitations. Gastrointestinal: No abdominal distention, no abdominal pain, no change in  bowel habits hematochezia or melena. Genitourinary: No dysuria, no frequency, no urgency, no nocturia. Musculoskeletal:No arthralgias, no back pain, no gait disturbance or myalgias. Neurological: No dizziness, no headaches, no numbness, no seizures, no syncope, no weakness, no tremors. Hematologic: No lymphadenopathy, no easy bruising. Psychiatric: No confusion, no hallucinations, no sleep disturbance.    Physical Exam: Filed Vitals:   09/04/11 1119  BP: 124/70  Pulse: 70   General appearance reveals a well-developed, well-nourished, gentleman in no distress.Pupils equal and reactive.   Extraocular Movements are full.  There is no scleral icterus.  The mouth and pharynx are normal.  The neck is supple.  He has a slow upstroke to his carotid pulse.  His heart murmur is audible in the carotids.  The jugular venous  pressure is normal.  The thyroid is not enlarged.  There is no lymphadenopathy. The chest is clear to percussion and auscultation. There are no rales or rhonchi. Expansion of the chest is symmetrical.  The heart reveals a grade 2/6 musical systolic ejection murmur audible at the base and apex.  I don't appreciate any diastolic murmur.  There is no gallop or rub.  He has a somewhat barrel chest and I don't feel any left ventricular heave. The abdomen is soft and nontender. Bowel sounds are normal. The liver and spleen are not enlarged. The abdominal aorta appears enlarged by exam at least 3 cm. There are no bruits.  The pedal pulses are good.  There is no phlebitis or edema.  There is no cyanosis or clubbing. Strength is normal and symmetrical in all extremities.  There is no lateralizing weakness.  There are no sensory deficits.  The skin is warm and dry.  There is no rash.  EKG shows normal sinus rhythm with left ventricular hypertrophy and poor R wave progression V1 through V3    Assessment / Plan:  Progressive aortic stenosis now symptomatic. I recommended a right and left heart catheterization with anticipation for need for aortic valve replacement. We will also schedule him for an abdominal aortic ultrasound to evaluate potential for aneurysm.

## 2011-09-07 NOTE — Assessment & Plan Note (Signed)
Patient has progressive aortic stenosis now severe. He has symptoms of dyspnea on exertion. We have recommended right and left heart catheterization with coronary angiography to evaluate further. Procedure and risk were explained in detail to the patient he is agreeable to proceed.

## 2011-09-11 ENCOUNTER — Other Ambulatory Visit: Payer: Self-pay

## 2011-09-11 DIAGNOSIS — R198 Other specified symptoms and signs involving the digestive system and abdomen: Secondary | ICD-10-CM

## 2011-09-16 ENCOUNTER — Other Ambulatory Visit: Payer: Medicare Other | Admitting: *Deleted

## 2011-09-17 ENCOUNTER — Other Ambulatory Visit (HOSPITAL_COMMUNITY): Payer: Medicare Other | Admitting: Radiology

## 2011-09-17 ENCOUNTER — Ambulatory Visit (INDEPENDENT_AMBULATORY_CARE_PROVIDER_SITE_OTHER): Payer: Medicare Other | Admitting: *Deleted

## 2011-09-17 ENCOUNTER — Other Ambulatory Visit: Payer: Self-pay | Admitting: *Deleted

## 2011-09-17 ENCOUNTER — Encounter (INDEPENDENT_AMBULATORY_CARE_PROVIDER_SITE_OTHER): Payer: Medicare Other | Admitting: Cardiology

## 2011-09-17 DIAGNOSIS — R198 Other specified symptoms and signs involving the digestive system and abdomen: Secondary | ICD-10-CM

## 2011-09-17 DIAGNOSIS — I119 Hypertensive heart disease without heart failure: Secondary | ICD-10-CM

## 2011-09-17 DIAGNOSIS — I719 Aortic aneurysm of unspecified site, without rupture: Secondary | ICD-10-CM

## 2011-09-17 DIAGNOSIS — I359 Nonrheumatic aortic valve disorder, unspecified: Secondary | ICD-10-CM

## 2011-09-17 DIAGNOSIS — Z01812 Encounter for preprocedural laboratory examination: Secondary | ICD-10-CM

## 2011-09-17 DIAGNOSIS — Z01818 Encounter for other preprocedural examination: Secondary | ICD-10-CM

## 2011-09-17 DIAGNOSIS — T148XXA Other injury of unspecified body region, initial encounter: Secondary | ICD-10-CM

## 2011-09-17 DIAGNOSIS — I350 Nonrheumatic aortic (valve) stenosis: Secondary | ICD-10-CM

## 2011-09-17 LAB — CBC WITH DIFFERENTIAL/PLATELET
Basophils Absolute: 0 10*3/uL (ref 0.0–0.1)
Eosinophils Absolute: 0.3 10*3/uL (ref 0.0–0.7)
Lymphocytes Relative: 24.8 % (ref 12.0–46.0)
MCHC: 34.8 g/dL (ref 30.0–36.0)
Monocytes Relative: 9.2 % (ref 3.0–12.0)
Neutrophils Relative %: 57.6 % (ref 43.0–77.0)
RBC: 3.96 Mil/uL — ABNORMAL LOW (ref 4.22–5.81)
RDW: 13.9 % (ref 11.5–14.6)

## 2011-09-17 LAB — PROTIME-INR: INR: 1 ratio (ref 0.8–1.0)

## 2011-09-17 LAB — BASIC METABOLIC PANEL
CO2: 27 mEq/L (ref 19–32)
Calcium: 8.6 mg/dL (ref 8.4–10.5)
Creatinine, Ser: 0.9 mg/dL (ref 0.4–1.5)
GFR: 87.75 mL/min (ref >60.00)
Glucose, Bld: 88 mg/dL (ref 70–99)
Sodium: 142 mEq/L (ref 135–145)

## 2011-09-19 ENCOUNTER — Telehealth: Payer: Self-pay | Admitting: *Deleted

## 2011-09-19 NOTE — Progress Notes (Signed)
Addended by: Judithe Modest D on: 09/19/2011 03:20 PM   Modules accepted: Orders

## 2011-09-19 NOTE — Telephone Encounter (Signed)
Message copied by Burnell Blanks on Fri Sep 19, 2011  5:31 PM ------      Message from: Cassell Clement      Created: Thu Sep 18, 2011  7:41 AM       Please report.  The labs are stable.  Continue same meds.  Continue careful diet.

## 2011-09-19 NOTE — Telephone Encounter (Signed)
Advised patient

## 2011-09-23 ENCOUNTER — Inpatient Hospital Stay (HOSPITAL_BASED_OUTPATIENT_CLINIC_OR_DEPARTMENT_OTHER)
Admission: RE | Admit: 2011-09-23 | Discharge: 2011-09-23 | Disposition: A | Discharge status: Home / Self Care | Payer: Medicare Other | Source: Ambulatory Visit | Attending: Cardiology | Admitting: Cardiology

## 2011-09-23 ENCOUNTER — Encounter (HOSPITAL_BASED_OUTPATIENT_CLINIC_OR_DEPARTMENT_OTHER)
Admission: RE | Disposition: A | Discharge status: Home / Self Care | Payer: Self-pay | Source: Ambulatory Visit | Attending: Cardiology

## 2011-09-23 DIAGNOSIS — G4730 Sleep apnea, unspecified: Secondary | ICD-10-CM | POA: Insufficient documentation

## 2011-09-23 DIAGNOSIS — I359 Nonrheumatic aortic valve disorder, unspecified: Secondary | ICD-10-CM

## 2011-09-23 DIAGNOSIS — M1990 Unspecified osteoarthritis, unspecified site: Secondary | ICD-10-CM | POA: Insufficient documentation

## 2011-09-23 DIAGNOSIS — I350 Nonrheumatic aortic (valve) stenosis: Secondary | ICD-10-CM

## 2011-09-23 LAB — POCT I-STAT 3, ART BLOOD GAS (G3+)
Bicarbonate: 26.1 mEq/L — ABNORMAL HIGH (ref 20.0–24.0)
pCO2 arterial: 44.5 mmHg (ref 35.0–45.0)
pO2, Arterial: 60 mmHg — ABNORMAL LOW (ref 80.0–100.0)

## 2011-09-23 LAB — POCT I-STAT 3, VENOUS BLOOD GAS (G3P V)
Acid-base deficit: 2 mmol/L (ref 0.0–2.0)
Bicarbonate: 24.6 mEq/L — ABNORMAL HIGH (ref 20.0–24.0)
O2 Saturation: 70 %
TCO2: 26 mmol/L (ref 0–100)
pCO2, Ven: 47 mmHg (ref 45.0–50.0)
pO2, Ven: 39 mmHg (ref 30.0–45.0)

## 2011-09-23 SURGERY — JV LEFT AND RIGHT HEART CATHETERIZATION WITH CORONARY ANGIOGRAM
Anesthesia: Moderate Sedation

## 2011-09-23 MED ORDER — ACETAMINOPHEN 325 MG PO TABS: 650.0000 mg | ORAL_TABLET | ORAL | Status: DC | PRN

## 2011-09-23 MED ORDER — ONDANSETRON HCL 4 MG/2ML IJ SOLN: 4.0000 mg | Freq: Four times a day (QID) | INTRAMUSCULAR | Status: DC | PRN

## 2011-09-23 MED ORDER — ASPIRIN 81 MG PO CHEW
324.0000 mg | CHEWABLE_TABLET | ORAL | Status: AC
  Administered 2011-09-23: 324 mg via ORAL

## 2011-09-23 MED ORDER — DIAZEPAM 5 MG PO TABS
5.0000 mg | ORAL_TABLET | ORAL | Status: AC
  Administered 2011-09-23: 5 mg via ORAL

## 2011-09-23 MED ORDER — SODIUM CHLORIDE 0.9 % IV SOLN
INTRAVENOUS | Status: DC
  Administered 2011-09-23: 10:00:00 via INTRAVENOUS

## 2011-09-23 MED ORDER — SODIUM CHLORIDE 0.9 % IV SOLN: 1.0000 mL/kg/h | INTRAVENOUS | Status: DC

## 2011-09-23 NOTE — Progress Notes (Signed)
Bedrest begins @ 1140.  3 hours of bedrest,  Dr. Swaziland in to discuss results with patient and family.

## 2011-09-23 NOTE — Interval H&P Note (Signed)
History and Physical Interval Note:  09/23/2011 10:37 AM  Nicholas Walls  has presented today for surgery, with the diagnosis of Aortic Stenosis  The various methods of treatment have been discussed with the patient and family. After consideration of risks, benefits and other options for treatment, the patient has consented to  Procedure(s): JV LEFT AND RIGHT HEART CATHETERIZATION WITH CORONARY ANGIOGRAM as a surgical intervention .  The patients' history has been reviewed, patient examined, no change in status, stable for surgery.  I have reviewed the patients' chart and labs.  Questions were answered to the patient's satisfaction.     Theron Arista [facility]

## 2011-09-23 NOTE — Op Note (Signed)
Cardiac Catheterization Procedure Note  Name: Nicholas Walls MRN: [id number] DOB: 1936-01-04  Procedure: Right Heart Cath, Left Heart Cath, Selective Coronary Angiography, LV angiography  Indication: 76 yo male with progressive severe aortic stenosis, now symptomatic.   Procedural Details: The right groin was prepped, draped, and anesthetized with 1% lidocaine. Using the modified Seldinger technique a 5 French sheath was placed in the right femoral artery and a 7 French sheath was placed in the right femoral vein. A Swan-Ganz catheter was used for the right heart catheterization. Standard protocol was followed for recording of right heart pressures and sampling of oxygen saturations. Fick cardiac output was calculated. Standard Judkins catheters were used for selective coronary angiography and left ventriculography. There were no immediate procedural complications. The patient was transferred to the post catheterization recovery area for further monitoring.  Procedural Findings: Hemodynamics RA 5/3 mean 2 mmHg RV 31/6 mmHg PA 24/10 mean 17 mmHg PCWP 14/14 mean 10 mmHg LV 179/15 mmHg AO 142/72 mean 102 mmHg  Oxygen saturations: PA 70% AO 90%  Cardiac Output (Fick) 7.8 L/min  Cardiac Index (Fick) 3.4 L/min/m2  Aortic valve mean gradient 40 mm Hg Aortic valve area 1.1 cm2, index .5   Coronary angiography: Coronary dominance: right  Left mainstem: Normal  Left anterior descending (LAD): normal  Left circumflex (LCx): normal  Right coronary artery (RCA): normal  Left ventriculography: Left ventricular systolic function is normal, LVEF is estimated at 60-65%, there is no significant mitral regurgitation   Final Conclusions:   1. Normal coronary anatomy 2. Normal LV function 3. Severe aortic stenosis 4. Normal right heart pressures.  Recommendations: Refer for AV replacement.   Thedora Hinders, [facility] 09/23/2011, 11:22 AM

## 2011-09-23 NOTE — OR Nursing (Signed)
Discharge instructions reviewed and signed, pt stated understanding, ambulated in hall without difficulty, site level 0, transported to wife's car via wheelchair 

## 2011-09-23 NOTE — H&P (View-Only) (Signed)
Nicholas Walls Date of Birth:  12/08/1935 [facility] [address]   [address] , [place]  [address] [phone]           Fax   [phone]  History of Present Illness: This pleasant 75-year-old gentleman is seen at the request of Dr. Brackbill for evaluation for cardiac catheterization.  Has a history of aortic stenosis.  His heart murmur was first noticed in 2006 by his family doctor, who sent him to Dr. Kersey, who did an echocardiogram.  The echocardiogram in 2006 showed mild to moderate aortic stenosis with a peak gradient of 25 and a mean gradient of 15 and a calculated aortic valve area of 1.6 cm square.  He also had mild aortic insufficiency.  Subsequent echocardiograms have shown progression of his aortic stenosis.  On 11/06/09.  His peak gradient was 55 with a mean gradient of 33 and a calculated aortic valve area of 0.8.  5.  His most recent echocardiogram formed on 05/29/11 showed a peak gradient of 64 and a mean gradient of 38.  The patient does not have any history of known ischemic heart disease.  He had a normal treadmill Cardiolite stress test in 2007 as preop for knee surgery.  The patient has not been experiencing any exertional chest pain.  He does have exertional dyspnea.  If he has to carrying groceries from the car.  He is extremely short of breath.  He sleeps on one pillow and has not had orthopnea, or paroxysmal nocturnal dyspnea.  He is not having any peripheral edema.  He has not had any exertional dizziness, or syncope.  He's not had any history of palpitations.  He does have a history of sleep apnea, but does not use a CPAP machine, which he did not tolerate.  Current Outpatient Prescriptions  Medication Sig Dispense Refill  . aspirin 81 MG tablet Take 81 mg by mouth daily.        . ferrous fumarate-iron polysaccharide complex (TANDEM) 162-115.2 MG CAPS Take 1 capsule by mouth daily with breakfast.        . fish oil-omega-3 fatty acids  1000 MG capsule Take 2 g by mouth daily.        . losartan (COZAAR) 50 MG tablet Take 50 mg by mouth daily.        . naproxen sodium (ANAPROX) 220 MG tablet Take 220 mg by mouth 3 (three) times daily with meals. Takes 3 daily       . simvastatin (ZOCOR) 20 MG tablet Take 20 mg by mouth at bedtime.          No Known Allergies  Patient Active Problem List  Diagnoses  . Osteoarthritis  . Aortic stenosis, severe  . Sleep apnea    History  Smoking status  . Former Smoker  Smokeless tobacco  . Not on file    History  Alcohol Use     Family History  Problem Relation Age of Onset  . Cancer Mother     throat  . Heart failure Father   . Cancer Father     Lung Cancer  . Hypertension Sister     Review of Systems: Constitutional: no fever chills diaphoresis or fatigue or change in weight.  Head and neck: no hearing loss, no epistaxis, no photophobia or visual disturbance. Respiratory: No cough, shortness of breath or wheezing. Cardiovascular: No chest pain peripheral edema, palpitations. Gastrointestinal: No abdominal distention, no abdominal pain, no change in   bowel habits hematochezia or melena. Genitourinary: No dysuria, no frequency, no urgency, no nocturia. Musculoskeletal:No arthralgias, no back pain, no gait disturbance or myalgias. Neurological: No dizziness, no headaches, no numbness, no seizures, no syncope, no weakness, no tremors. Hematologic: No lymphadenopathy, no easy bruising. Psychiatric: No confusion, no hallucinations, no sleep disturbance.    Physical Exam: Filed Vitals:   09/04/11 1119  BP: 124/70  Pulse: 70   General appearance reveals a well-developed, well-nourished, gentleman in no distress.Pupils equal and reactive.   Extraocular Movements are full.  There is no scleral icterus.  The mouth and pharynx are normal.  The neck is supple.  He has a slow upstroke to his carotid pulse.  His heart murmur is audible in the carotids.  The jugular venous  pressure is normal.  The thyroid is not enlarged.  There is no lymphadenopathy. The chest is clear to percussion and auscultation. There are no rales or rhonchi. Expansion of the chest is symmetrical.  The heart reveals a grade 2/6 musical systolic ejection murmur audible at the base and apex.  I don't appreciate any diastolic murmur.  There is no gallop or rub.  He has a somewhat barrel chest and I don't feel any left ventricular heave. The abdomen is soft and nontender. Bowel sounds are normal. The liver and spleen are not enlarged. The abdominal aorta appears enlarged by exam at least 3 cm. There are no bruits.  The pedal pulses are good.  There is no phlebitis or edema.  There is no cyanosis or clubbing. Strength is normal and symmetrical in all extremities.  There is no lateralizing weakness.  There are no sensory deficits.  The skin is warm and dry.  There is no rash.  EKG shows normal sinus rhythm with left ventricular hypertrophy and poor R wave progression V1 through V3    Assessment / Plan:  Progressive aortic stenosis now symptomatic. I recommended a right and left heart catheterization with anticipation for need for aortic valve replacement. We will also schedule him for an abdominal aortic ultrasound to evaluate potential for aneurysm.   

## 2011-10-02 ENCOUNTER — Encounter: Payer: Self-pay | Admitting: Cardiothoracic Surgery

## 2011-10-02 ENCOUNTER — Institutional Professional Consult (permissible substitution) (INDEPENDENT_AMBULATORY_CARE_PROVIDER_SITE_OTHER): Payer: Medicare Other | Admitting: Cardiothoracic Surgery

## 2011-10-02 ENCOUNTER — Other Ambulatory Visit: Payer: Self-pay | Admitting: Cardiothoracic Surgery

## 2011-10-02 VITALS — BP 140/73 | HR 83 | Resp 20 | Ht 74.5 in | Wt 226.0 lb

## 2011-10-02 DIAGNOSIS — I059 Rheumatic mitral valve disease, unspecified: Secondary | ICD-10-CM

## 2011-10-02 DIAGNOSIS — I350 Nonrheumatic aortic (valve) stenosis: Secondary | ICD-10-CM

## 2011-10-02 DIAGNOSIS — I359 Nonrheumatic aortic valve disorder, unspecified: Secondary | ICD-10-CM

## 2011-10-02 NOTE — Patient Instructions (Addendum)
Stop naproxen Stop fish oil  Aortic Valve Replacement You have a disease of one of the valves of your heart. In you or your child's case, it is the aortic valve which needs replacing. Aortic valve replacement is open heart surgery done by a heart surgeon. This operation treats problems with the aortic valve. The aortic valve is the "outflow valve" for the left side of the heart. The left side of your heart (left ventricle) is the large muscular part of the heart that pumps blood to the rest of the body. It separates the left ventricle from the aorta. When the heart squeezes down (contracts), the aortic valve is what keeps the blood from flowing back into the ventricle from the aorta. This allows the blood to keep moving through the body.  Surgery may be necessary when the valve does not open or close completely. A stenotic (narrow) valve does not let the blood leave the heart normally. This causes blood to back up in the left ventricle. This makes it hard for the heart to increase the amount of blood that it pumps. The heart has to work harder. This may produce shortness of breath and fatigue. Problems are worse with activity.  If the valve leaflets do not meet correctly when closing, blood may leak backward into the ventricle each time the heart pumps. This is called aortic insufficiency. When some of the blood leaks backwards, the heart has to work even harder. The heart can allow for this over-work for a long time if the leakage came on slowly. Eventually, the heart fails.  Aortic valve problems may be caused by a birth defect. This is called congenital. Wear and tear can cause valves to fail. More commonly, rheumatic fever may damage the aortic valve. Occasionally, the valve may be damaged by infection. This also causes the aortic valve to leak.  DESCRIPTION OF SURGERY Aortic valves can be repaired. When the valve is too damaged to repair, the valve must be replaced. A prosthetic (artificial) valve is  used to do this. Valves damaged by rheumatic disease often must be replaced.  Two types of artificial valves are available:  Mechanical valves made entirely from man-made materials.   Biological valves which are made from animal tissues or taken from a cadaver.  Each has advantages and disadvantages. The choice of which type to use should be made by you and your surgeon. Your risks, age, lifestyle, other medical problems including the decision on whether to be on blood thinners the rest of your life all will help you decide on which type of valve to use. There are a number of good MECHANICAL PROSTHESES available. All work well. The main advantage of mechanical valves is that they do not wear out. Their main disadvantage is that blood clots easier on mechanical valves. If this happens the valve will not work normally. Because of this, patients with mechanical valves must take anticoagulants (blood thinners) for life. There is also a small but definite risk of blood clots causing stroke, even when taking anticoagulants.  There are a number of BIOLOGICAL CHOICES for aortic valve replacement. Most are made from pig aortic valves. Some are taken from cadavers. The main advantage is that they have a reduced risk of blood clots forming on the valve. This lessens the chance of the valve not working or causing a stroke. A large disadvantage of biological or tissue valves is that they wear out sooner than mechanical valves. The rate at which they wear out depends  on the patient's age. A young boy might wear out such a valve in only a few years. The same valve might last 10 years in a middle aged person, and even longer in a patient over the age of 82. A tissue valve used in a person over 39 years old may never need replacement. RISKS AND COMPLICATIONS Your cardiologist and cardiothoracic surgeon can best determine your individual risk. It will depend on your age, general condition, medical conditions, and your heart  function. In general, the risks include:  Problems from the operation itself are low risk. Some common risks are:   Risks from the anesthesia.   Bleeding and infection.   Lifelong treatment with medications to prevent blood clots is needed for mechanical valve replacements.   Infection is more common with valve replacement than with valve repair.   Valve failure is more common with valve replacement than with valve repair. Pig valves tend to fail after about 8 to 10 years.  PROCEDURE  Valve repair or replacement is open-heart surgery. You are given general anesthesia (medications to help you sleep). You are then placed on a heart-lung machine. This machine provides oxygen to your blood while the heart is not working. The surgery generally lasts from 3 to 5 hours. During surgery, the surgeon makes a large incision (cut) in the chest. Sometimes the heart is cooled to slow or stop the heartbeat. The damaged aortic valve is either repaired or removed and replaced with an artificial heart valve. AFTER THE PROCEDURE  Recovery from heart valve surgery usually involves a few days in an intensive care unit (ICU) of a hospital. Full recovery from heart valve surgery can take several months.   Anticoagulation (blood thinning) treatment with warfarin is often prescribed for 6 weeks to 3 months after surgery for those with biological valves. It is prescribed for life for those with mechanical valves.   Recovery includes healing of the surgical incision. There is a gradual building of stamina and exercise abilities. An exercise program under the direction of a physical therapist may be recommended.   Once you have an artificial valve, your heart function and your life will return to normal. You usually feel better after surgery. Shortness of breath and fatigue should lessen. If your heart was already severely damaged before your surgery, you may continue to have problems.   You can usually resume most of  your normal activities. You will have to continue to monitor your condition. You need to watch out for blood clots and infections.   Artificial valves need to be replaced after a period of time. It is important that you see your caregiver regularly.   Some individuals with an aortic valve replacement need to take antibiotics before having dental work or other surgical procedures. This is called prophylactic antibiotic treatment. These drugs help to prevent infective endocarditis. Antibiotics are only recommended for individuals with the highest risk for developing infective endocarditis. Let your dentist and your caregiver know if you have a history of any of the following so that the necessary precautions can be taken:   A VSD.   A repaired VSD.   Endocarditis in the past.   An artificial (prosthetic) heart valve.  HOME CARE INSTRUCTIONS   Use all medications as prescribed.   Take your temperature every morning for the first week after surgery. Record these.   Weigh yourself every morning for at least the first week after surgery and record.   Do not lift more than  10 pounds (4.5 kg) until your sternum (breastbone) has healed. Avoid all activities which would place strain on your incision.   You may shower but do not take baths until instructed by your caregivers.   Avoid driving for 4 to 6 weeks following surgery or as instructed.   Use your elastic stockings during the day. You should wear the stockings for at least 2 weeks after discharge or longer if your ankles are swollen. The stockings help blood flow and help reduce swelling in the legs. It is easiest to put the stockings on before you get out of bed in the morning. They should fit snugly.  SEEK IMMEDIATE MEDICAL CARE IF:  You develop chest pain which is not coming from your incision (surgical cut) .   You develop shortness of breath.   You develop a temperature over 101 F (38.3 C).   You have a sudden weight gain. Let  your caregiver know what the weight gain is.  Document Released: 01/07/2005 Document Revised: 04/30/2011 Document Reviewed: 08/14/2008 [facility] Patient Information 2012 [place], [place].Aortic Stenosis Aortic stenosis, or aortic valve stenosis, is a narrowing of the aortic valve. When the aortic valve is narrowed, the valve does not open and close very well. This restricts blood flow between the left side of the heart and the aorta (the large artery which takes blood to the rest of the body). This restriction makes it hard for your heart to pump blood. This extra work can weaken your heart and can lead to heart failure. CAUSES  Causes of aortic valve stenosis can vary. Some of these can include:  Calcium deposits on the aortic valve. Calcium can buildup on the aortic valve and make it stiff. This cause of aortic stenosis is most common in people over the age of 37.   Congenital heart defect. This can occur during the development of the fetus and can result in an aortic valve defect.   Rheumatic fever. Rheumatic fever is a bacterial infection that can develop from a strep throat infection. The bacteria from rheumatic fever can attach themselves to the valve. This can cause scarring on the aortic valve, causing it to become narrow.  SYMPTOMS  Symptoms of aortic valve stenosis develop when the valve disease is severe. Symptoms can include:  Shortness of breath, especially with physical activity.   Feeling tired (fatigue).   Chest pain (angina) or tightness.   Feeing your heart race or beat funny (heart palpitations).   Dizziness or fainting.  DIAGNOSIS  Aortic stenosis is diagnosed through:  A physical exam and symptoms.   A heart murmur.   Echocardiography. This test uses sound waves to produce images of your heart.  TREATMENT   Surgery is the treatment for aortic valve stenosis.   Surgery may not be needed right away. Surgery is necessary when narrowing of the aortic valve becomes  severe, and symptoms develop or become worse.   Medications cannot reverse aortic valve stenosis.  HOME CARE INSTRUCTIONS   If you have aortic stenosis, you many need to avoid strenuous physical activity. Talk with your caregiver about what types of activities you should avoid.   If you are a woman with aortic valve stenosis and are of child-bearing age, talk to your caregiver before you become pregnant.   If you become pregnant, you will need to be monitored by your obstetrician and cardiologist throughout your pregnancy, labor and delivery, and after delivery.  SEEK IMMEDIATE MEDICAL CARE IF:  You develop chest pain or  tightness.   You develop shortness of breath or difficulty breathing.   You develop lightheadedness or fainting.   You have heart palpitations or skipped heartbeats.  Document Released: 05/17/2003 Document Revised: 04/30/2011 Document Reviewed: 12/25/2009 [facility] Patient Information 2012 [place], [place].

## 2011-10-02 NOTE — Progress Notes (Signed)
[address]            [place] [address]          [phone]      Nicholas Walls [facility] Medical Record #[id number] Date of Birth: 1936-03-14  Referring: Dr Patty Sermons,  MD Primary Care: Lorelei Pont, DO, DO  Chief Complaint:    Chief Complaint  Patient presents with  . Aortic Stenosis    Referral from Dr Swazil[place] for surgical eval on Aortic Stenosis, Cardiac Cath 09/23/11    History of Present Illness:     Patient is a 76 year old male who said known aortic stenosis. He first had a heart murmur noticed in 2006. At that time an echocardiogram showed mild to moderate aortic stenosis with peak gradient of 25 and a mean gradient of 15 with a calculated aortic valve area of 1.6 cm. He also had mild aortic insufficiency. Serial echocardiograms have confirmed progressive aortic stenosis.   The patient denies angina syncope, pedal edema, nocturnal dyspnea. he does have 1 per low orthopnea. He does note fatigue especially while working. He does have exertional dyspnea which seems to be getting worse. His wife notes that she has a diagnosis of COPD. In the past she would become short of breath long before he did while walking. Now he becomes more dyspneic before she does. He does have a history of sleep apnea.  Because of worsening aortic valvular stenosis and increasing symptoms of dyspnea on exertion the patient is referred for consideration of aortic valve replacement  Current Activity/ Functional Status: Patient is independent with mobility/ambulation, transfers, ADL's, IADL's.   Past Medical History  Diagnosis Date  . Hypertension   . Hyperlipidemia   . Aortic stenosis, severe   . Osteoarthritis      End-stage osteoarthritis--left knee  . History of hiatal hernia   . History of anemia of chronic disease   . Hyponatremia     Mild hyponatremia, allowed to self-correct, asymptomatic  . Sleep apnea     Mild sleep apnea    Past Surgical  History  Procedure Date  . Cholecystectomy   . Total knee arthroplasty     bilateral    Family History  Problem Relation Age of Onset  . Cancer Mother  age 8    throat  . Heart failure Father   . Cancer Father Age 44    Lung Cancer  . Hypertension Sister     History   Social History  . Marital Status: Married    Spouse Name: N/A    Number of Children: N/A  . Years of Education: N/A   Occupational History  . Not on file.   Social History Main Topics  . Smoking status: Former Smoker    Types: Cigarettes    Quit date: 09/01/1981  . Smokeless tobacco: Never Used  . Alcohol Use: Yes  . Drug Use: No  .                   History  Smoking status  . Former Smoker  . Types: Cigarettes  . Quit date: 09/01/1981  Smokeless tobacco  . Never Used    History  Alcohol Use  . Yes     No Known Allergies  Current Outpatient Prescriptions  Medication Sig Dispense Refill  . aspirin 81 MG tablet Take 81 mg by mouth daily.        [name]  ferrous fumarate-iron polysaccharide complex (TANDEM) 162-115.2 MG CAPS Take 1 capsule by mouth daily with breakfast.        . fish oil-omega-3 fatty acids 1000 MG capsule Take 2 g by mouth daily.        [name] losartan (COZAAR) 50 MG tablet Take 50 mg by mouth daily.        . naproxen sodium (ANAPROX) 220 MG tablet Take 220 mg by mouth 3 (three) times daily with meals. Takes 3 daily       . simvastatin (ZOCOR) 20 MG tablet Take 20 mg by mouth at bedtime.             Review of Systems:     Cardiac Review of Systems: Y or N  Chest Pain [  n  ]  Resting SOB [ n  ] Exertional SOB  [ y ]  Orthopnea [ n ]   Pedal Edema [  n ]    Palpitations [ n ] Syncope  [ n ]   Presyncope [n   ]  General Review of Systems: [Y] = yes [  ]=no Constitional: recent weight change [ n ]; anorexia [ n]; fatigue [place]  ]; nausea [ n ]; night sweats [n  ]; fever [ n ]; or chills [ n ];                                                                                                                                           Dental: poor dentition[  ]; Last Dentist visit: no teeth  Eye : blurred vision [  ]; diplopia [   ]; vision changes [  ];  Amaurosis fugax[  ]; Resp: cough [ n ];  wheezing[n  ];  hemoptysis[  ]; shortness of breath[  ]; paroxysmal nocturnal dyspnea[  ]; dyspnea on exertion[  ]; or orthopnea[  ];  GI:  gallstones[  ], vomiting[  ];  dysphagia[  ]; melena[  ];  hematochezia [  ]; heartburn[  ];   Hx of  Colonoscopy[ y ]; GU: kidney stones [  ]; hematuria[n  ];   dysuria [ n ];  nocturia[  ];  history of     obstruction [  ];             Skin: rash, swelling[  ];, hair loss[n  ];  peripheral edema[  ];  or itching[  ]; Musculosketetal: myalgias[ y ];  joint swelling[y  ];  joint erythema[y  ];  joint pain[ y ];  back pain[ y ];  Heme/Lymph: bruising[ n ];  bleeding[ n ];  anemia[y  ];  Neuro: TIA[  ];  headaches[  ];  stroke[  ];  vertigo[ y ];  seizures[ n ];   paresthesias[n  ];  difficulty walking[ n ];  Psych:depression[  ]; anxiety[  ];  Endocrine: diabetes[  ];  thyroid dysfunction[  ];  Immunizations: Flu [ y ]; Pneumococcal[ y ];  Other:  Physical Exam: BP 140/73  Pulse 83  Resp 20  Ht 6' 2.5" (1.892 m)  Wt 226 lb (102.513 kg)  BMI 28.63 kg/m2  SpO2 97%  General appearance: alert, cooperative, appears stated age and no distress Neurologic: intact Heart: regular rate and rhythm and systolic murmur: holosystolic 4/6, blowing throughout the precordium Lungs: clear to auscultation bilaterally Abdomen: soft, non-tender; bowel sounds normal; no masses,  no organomegaly Extremities: extremities normal, atraumatic, no cyanosis or edema, Homans sign is negative, no sign of DVT and no edema, redness or tenderness in the calves or thighs   Diagnostic Studies & Laboratory data:     Recent Radiology Findings:  Dg Chest 2 View  09/04/2011  *RADIOLOGY REPORT*  Clinical Data: Chest pain, preop for cardiac catheterization  CHEST - 2 VIEW   Comparison: Chest x-ray of 08/20/2010  Findings: The lungs are clear but hyperaerated consistent with COPD.  There is peribronchial thickening indicative of bronchitis. Mediastinal contours are stable.  The heart is mildly enlarged and stable.  No acute bony abnormality is seen.  IMPRESSION: Hyperaeration consistent with COPD.  No active lung disease.  Original Report Authenticated By: Juline Patch, M.D.   US Abdomen Complete: done to evauate   ECHO 05/2011 Study Conclusions  - Left ventricle: The cavity size was normal. Wall thickness was increased in a pattern of mild LVH. Systolic function was normal. The estimated ejection fraction was in the range of 55% to 65%. Wall motion was normal; there were no regional wall motion abnormalities. Features are consistent with a pseudonormal left ventricular filling pattern, with concomitant abnormal relaxation and increased filling pressure (grade 2 diastolic dysfunction). - Aortic valve: There was severe stenosis. Mild regurgitation. - Mitral valve: Calcified annulus. Mildly thickened leaflets . Mild regurgitation. - Left atrium: The atrium was mildly dilated. - Right ventricle: The cavity size was mildly dilated. - Atrial septum: No defect or patent foramen ovale was identified. Transthoracic echocardiography. M-mode, complete 2D, spectral Doppler, and color Doppler. Height: Height: 188cm. Height: 74in. Weight: Weight: 95.3kg. Weight: 209.6lb. Body mass index: BMI: 27kg/m^2. Body surface area: BSA: 2.37m^2. Blood pressure: 130/70. Patient status: Outpatient. Location: [facility]   aortic vale velocity 405 cm/sec Peak grad by echo 64 mean 38 AVA o.8   Recent Lab Findings: Lab Results  Component Value Date   WBC 3.4* 09/17/2011   HGB 13.6 09/17/2011   HCT 39.1 09/17/2011   PLT 144.0* 09/17/2011   GLUCOSE 88 09/17/2011   ALT 26 08/20/2010   AST 26 08/20/2010   NA 142 09/17/2011   K 4.6 09/17/2011   CL 106 09/17/2011    CREATININE 0.9 09/17/2011   BUN 18 09/17/2011   CO2 27 09/17/2011   INR 1.0 09/17/2011   Procedural Findings:  Hemodynamics  RA 5/3 mean 2 mmHg  RV 31/6 mmHg  PA 24/10 mean 17 mmHg  PCWP 14/14 mean 10 mmHg  LV 179/15 mmHg  AO 142/72 mean 102 mmHg  Oxygen saturations:  PA 70%  AO 90%  Cardiac Output (Fick) 7.8 L/min  Cardiac Index (Fick) 3.4 L/min/m2  Aortic valve mean gradient 40 mm Hg  Aortic valve area 1.1 cm2, index .5  Coronary angiography:  Coronary dominance: right  Left mainstem: Normal  Left anterior descending (LAD): normal  Left circumflex (LCx): normal  Right coronary artery (RCA): normal  Left ventriculography: Left ventricular systolic function is normal, LVEF  is estimated at 60-65%, there is no significant mitral regurgitation  Final Conclusions:  1. Normal coronary anatomy  2. Normal LV function  3. Severe aortic stenosis  4. Normal right heart pressures.  Recommendations: Refer for AV replacement.  Thedora Hinders, [facility]  09/23/2011, 11:22 AM     Assessment / Plan:     Symptomatic aortic stenosis in 76 year old male without significant coronary artery disease Agree with Dr. Sw[place] to proceed with aortic valve replacement. The risks and options and the nature of the procedure were discussed with the patient and his wife in detail. The options of mechanical versus tissue valve were also discussed. With the patient's age tissue valve seems most appropriate and the patient is agreeable with this.  The goals risks and alternatives of the planned surgical procedure AVR have been discussed with the patient in detail. The risks of the procedure including death, infection, stroke, myocardial infarction,heart block,  bleeding, blood transfusion have all been discussed specifically.  I have quoted Nicholas Walls a 4 % of perioperative mortality and a complication rate as high as 20%. The patient's questions have been answered.Nicholas Walls is willing  to proceed with  the planned procedure.  The planned surgery for February 11  [name] MD  Beeper [phone] Office [phone] 10/02/2011 5:29 PM

## 2011-10-03 ENCOUNTER — Other Ambulatory Visit: Payer: Self-pay

## 2011-10-03 DIAGNOSIS — I359 Nonrheumatic aortic valve disorder, unspecified: Secondary | ICD-10-CM

## 2011-10-06 ENCOUNTER — Encounter (HOSPITAL_COMMUNITY): Payer: Self-pay | Admitting: Pharmacy Technician

## 2011-10-09 ENCOUNTER — Encounter (HOSPITAL_COMMUNITY)
Admission: RE | Admit: 2011-10-09 | Discharge: 2011-10-09 | Disposition: A | Discharge status: Home / Self Care | Payer: Medicare Other | Source: Ambulatory Visit | Attending: Cardiothoracic Surgery | Admitting: Cardiothoracic Surgery

## 2011-10-09 ENCOUNTER — Inpatient Hospital Stay (HOSPITAL_COMMUNITY)
Admission: RE | Admit: 2011-10-09 | Discharge: 2011-10-09 | Disposition: A | Discharge status: Home / Self Care | Payer: Medicare Other | Source: Ambulatory Visit | Attending: Cardiothoracic Surgery | Admitting: Cardiothoracic Surgery

## 2011-10-09 ENCOUNTER — Encounter (HOSPITAL_COMMUNITY): Payer: Self-pay

## 2011-10-09 ENCOUNTER — Ambulatory Visit (HOSPITAL_COMMUNITY)
Admission: RE | Admit: 2011-10-09 | Discharge: 2011-10-09 | Disposition: A | Discharge status: Home / Self Care | Payer: Medicare Other | Source: Ambulatory Visit | Attending: Cardiothoracic Surgery | Admitting: Cardiothoracic Surgery

## 2011-10-09 ENCOUNTER — Other Ambulatory Visit: Payer: Self-pay

## 2011-10-09 DIAGNOSIS — I359 Nonrheumatic aortic valve disorder, unspecified: Secondary | ICD-10-CM

## 2011-10-09 DIAGNOSIS — Z01810 Encounter for preprocedural cardiovascular examination: Secondary | ICD-10-CM

## 2011-10-09 DIAGNOSIS — Z01811 Encounter for preprocedural respiratory examination: Secondary | ICD-10-CM | POA: Insufficient documentation

## 2011-10-09 DIAGNOSIS — Z01812 Encounter for preprocedural laboratory examination: Secondary | ICD-10-CM | POA: Insufficient documentation

## 2011-10-09 HISTORY — DX: Nonrheumatic aortic valve disorder, unspecified: I35.9

## 2011-10-09 LAB — URINALYSIS, ROUTINE W REFLEX MICROSCOPIC
Bilirubin Urine: NEGATIVE
Glucose, UA: NEGATIVE mg/dL
Ketones, ur: NEGATIVE mg/dL
Leukocytes, UA: NEGATIVE
Nitrite: NEGATIVE
Protein, ur: NEGATIVE mg/dL
Specific Gravity, Urine: 1.011 (ref 1.005–1.030)
Urobilinogen, UA: 2 mg/dL — ABNORMAL HIGH (ref 0.0–1.0)
pH: 7 (ref 5.0–8.0)

## 2011-10-09 LAB — BLOOD GAS, ARTERIAL
Acid-Base Excess: 2.3 mmol/L — ABNORMAL HIGH (ref 0.0–2.0)
Bicarbonate: 26 mEq/L — ABNORMAL HIGH (ref 20.0–24.0)
Drawn by: 181601
FIO2: 0.21 %
O2 Saturation: 97.6 %
Patient temperature: 98.6
TCO2: 27.1 mmol/L (ref 0–100)
pCO2 arterial: 37.8 mmHg (ref 35.0–45.0)
pH, Arterial: 7.451 — ABNORMAL HIGH (ref 7.350–7.450)
pO2, Arterial: 86.5 mmHg (ref 80.0–100.0)

## 2011-10-09 LAB — COMPREHENSIVE METABOLIC PANEL
ALT: 20 U/L (ref 0–53)
AST: 20 U/L (ref 0–37)
Albumin: 3.8 g/dL (ref 3.5–5.2)
Alkaline Phosphatase: 68 U/L (ref 39–117)
BUN: 15 mg/dL (ref 6–23)
CO2: 24 mEq/L (ref 19–32)
Calcium: 9.1 mg/dL (ref 8.4–10.5)
Chloride: 103 mEq/L (ref 96–112)
Creatinine, Ser: 0.82 mg/dL (ref 0.50–1.35)
GFR calc Af Amer: >90 mL/min (ref >90)
GFR calc non Af Amer: 86 mL/min — ABNORMAL LOW (ref >90)
Glucose, Bld: 98 mg/dL (ref 70–99)
Potassium: 4.4 mEq/L (ref 3.5–5.1)
Sodium: 138 mEq/L (ref 135–145)
Total Bilirubin: 0.7 mg/dL (ref 0.3–1.2)
Total Protein: 6.9 g/dL (ref 6.0–8.3)

## 2011-10-09 LAB — CBC
HCT: 40 % (ref 39.0–52.0)
Hemoglobin: 14.3 g/dL (ref 13.0–17.0)
MCH: 32.7 pg (ref 26.0–34.0)
MCHC: 35.8 g/dL (ref 30.0–36.0)
MCV: 91.5 fL (ref 78.0–100.0)
Platelets: 155 10*3/uL (ref 150–400)
RBC: 4.37 MIL/uL (ref 4.22–5.81)
RDW: 12.9 % (ref 11.5–15.5)
WBC: 4.7 10*3/uL (ref 4.0–10.5)

## 2011-10-09 LAB — APTT: aPTT: 32 seconds (ref 24–37)

## 2011-10-09 LAB — URINE MICROSCOPIC-ADD ON

## 2011-10-09 LAB — HEMOGLOBIN A1C
Hgb A1c MFr Bld: 4.9 % (ref <5.7)
Mean Plasma Glucose: 94 mg/dL (ref <117)

## 2011-10-09 LAB — SURGICAL PCR SCREEN
MRSA, PCR: NEGATIVE
Staphylococcus aureus: NEGATIVE

## 2011-10-09 LAB — TYPE AND SCREEN
ABO/RH(D): O POS
Antibody Screen: NEGATIVE

## 2011-10-09 LAB — PROTIME-INR
INR: 1.04 (ref 0.00–1.49)
Prothrombin Time: 13.8 seconds (ref 11.6–15.2)

## 2011-10-09 MED ORDER — CHLORHEXIDINE GLUCONATE 4 % EX LIQD: 30.0000 mL | CUTANEOUS | Status: DC

## 2011-10-09 NOTE — Progress Notes (Signed)
Cath in epic Dopplers and PFT s done before pat visit. Office notes in epic

## 2011-10-09 NOTE — Pre-Procedure Instructions (Signed)
20 Nicholas Walls  10/09/2011   Your procedure is scheduled on:  10/13/11  Report to [facility] at 530 AM.  Call this number if you have problems the morning of surgery: [phone]   Remember:   Do not eat food:After Midnight.  May have clear liquids: up to 4 Hours before arrival.  Clear liquids include soda, tea, black coffee, apple or grape juice, broth.  Take these medicines the morning of surgery with A SIP OF WATER: *none**   Do not wear jewelry, make-up or nail polish.  Do not wear lotions, powders, or perfumes. You may wear deodorant.  Do not shave 48 hours prior to surgery.  Do not bring valuables to the hospital.  Contacts, dentures or bridgework may not be worn into surgery.  Leave suitcase in the car. After surgery it may be brought to your room.  For patients admitted to the hospital, checkout time is 11:00 AM the day of discharge.   Patients discharged the day of surgery will not be allowed to drive home.  Name and phone number of your driver: family  Special Instructions: CHG Shower Use Special Wash: 1/2 bottle night before surgery and 1/2 bottle morning of surgery.   Please read over the following fact sheets that you were given: Pain Booklet, Coughing and Deep Breathing, Blood Transfusion Information, Open Heart Packet, Total Joint Packet, MRSA Information and Surgical Site Infection Prevention

## 2011-10-10 NOTE — Progress Notes (Signed)
Called for Doppler studies ,vascular lab states they have not been read yet.  Will fax a preliminary report.

## 2011-10-12 MED ORDER — EPINEPHRINE HCL 1 MG/ML IJ SOLN
0.5000 ug/min | INTRAVENOUS | Status: DC
  Filled 2011-10-12: qty 4

## 2011-10-12 MED ORDER — NITROGLYCERIN IN D5W 200-5 MCG/ML-% IV SOLN
2.0000 ug/min | INTRAVENOUS | Status: DC
  Filled 2011-10-12: qty 250

## 2011-10-12 MED ORDER — MAGNESIUM SULFATE 50 % IJ SOLN
40.0000 meq | INTRAMUSCULAR | Status: DC
Start: 2011-10-13 — End: 2011-10-13
  Filled 2011-10-12: qty 10

## 2011-10-12 MED ORDER — VANCOMYCIN HCL 1000 MG IV SOLR
1500.0000 mg | INTRAVENOUS | Status: AC
  Administered 2011-10-13: 1500 mg via INTRAVENOUS
  Filled 2011-10-12: qty 1500

## 2011-10-12 MED ORDER — SODIUM CHLORIDE 0.9 % IV SOLN
INTRAVENOUS | Status: DC
  Filled 2011-10-12: qty 1

## 2011-10-12 MED ORDER — DOPAMINE-DEXTROSE 3.2-5 MG/ML-% IV SOLN
2.0000 ug/kg/min | INTRAVENOUS | Status: DC
  Filled 2011-10-12: qty 250

## 2011-10-12 MED ORDER — PHENYLEPHRINE HCL 10 MG/ML IJ SOLN
30.0000 ug/min | INTRAVENOUS | Status: DC
  Filled 2011-10-12: qty 2

## 2011-10-12 MED ORDER — AMINOCAPROIC ACID 250 MG/ML IV SOLN
INTRAVENOUS | Status: DC
  Filled 2011-10-12: qty 40

## 2011-10-12 MED ORDER — SODIUM CHLORIDE 0.9 % IV SOLN
0.1000 ug/kg/h | INTRAVENOUS | Status: DC
  Filled 2011-10-12: qty 4

## 2011-10-12 MED ORDER — DEXTROSE 5 % IV SOLN
1.5000 g | INTRAVENOUS | Status: AC
  Administered 2011-10-13: 1.5 g via INTRAVENOUS
  Administered 2011-10-13: .75 g via INTRAVENOUS
  Filled 2011-10-12: qty 1.5

## 2011-10-12 MED ORDER — VERAPAMIL HCL 2.5 MG/ML IV SOLN
INTRAVENOUS | Status: AC
  Administered 2011-10-13: 10:00:00
  Filled 2011-10-12: qty 2.5

## 2011-10-12 MED ORDER — DEXTROSE 5 % IV SOLN
750.0000 mg | INTRAVENOUS | Status: DC
  Filled 2011-10-12: qty 750

## 2011-10-12 MED ORDER — POTASSIUM CHLORIDE 2 MEQ/ML IV SOLN
80.0000 meq | INTRAVENOUS | Status: DC
  Filled 2011-10-12: qty 40

## 2011-10-12 MED ORDER — METOPROLOL TARTRATE 12.5 MG HALF TABLET
12.5000 mg | ORAL_TABLET | Freq: Once | ORAL | Status: AC
  Administered 2011-10-13: 12.5 mg via ORAL
  Filled 2011-10-12: qty 1

## 2011-10-13 ENCOUNTER — Other Ambulatory Visit: Payer: Self-pay | Admitting: Cardiothoracic Surgery

## 2011-10-13 ENCOUNTER — Encounter (HOSPITAL_COMMUNITY)
Admission: RE | Disposition: A | Discharge status: Home Health | Payer: Self-pay | Source: Ambulatory Visit | Attending: Cardiothoracic Surgery

## 2011-10-13 ENCOUNTER — Inpatient Hospital Stay (HOSPITAL_COMMUNITY): Payer: Medicare Other

## 2011-10-13 ENCOUNTER — Encounter (HOSPITAL_COMMUNITY): Payer: Self-pay | Admitting: *Deleted

## 2011-10-13 ENCOUNTER — Encounter (HOSPITAL_COMMUNITY): Payer: Self-pay | Admitting: Anesthesiology

## 2011-10-13 ENCOUNTER — Other Ambulatory Visit: Payer: Self-pay

## 2011-10-13 ENCOUNTER — Inpatient Hospital Stay (HOSPITAL_COMMUNITY)
Admission: RE | Admit: 2011-10-13 | Discharge: 2011-10-18 | DRG: 220 | Disposition: A | Discharge status: Home Health | Payer: Medicare Other | Source: Ambulatory Visit | Attending: Cardiothoracic Surgery | Admitting: Cardiothoracic Surgery

## 2011-10-13 ENCOUNTER — Ambulatory Visit (HOSPITAL_COMMUNITY): Payer: Medicare Other | Admitting: Anesthesiology

## 2011-10-13 DIAGNOSIS — I359 Nonrheumatic aortic valve disorder, unspecified: Principal | ICD-10-CM | POA: Diagnosis present

## 2011-10-13 DIAGNOSIS — Z79899 Other long term (current) drug therapy: Secondary | ICD-10-CM

## 2011-10-13 DIAGNOSIS — Z7982 Long term (current) use of aspirin: Secondary | ICD-10-CM

## 2011-10-13 DIAGNOSIS — D696 Thrombocytopenia, unspecified: Secondary | ICD-10-CM | POA: Diagnosis not present

## 2011-10-13 DIAGNOSIS — G4730 Sleep apnea, unspecified: Secondary | ICD-10-CM | POA: Diagnosis present

## 2011-10-13 DIAGNOSIS — Z6828 Body mass index (BMI) 28.0-28.9, adult: Secondary | ICD-10-CM

## 2011-10-13 DIAGNOSIS — Z87891 Personal history of nicotine dependence: Secondary | ICD-10-CM

## 2011-10-13 DIAGNOSIS — M1990 Unspecified osteoarthritis, unspecified site: Secondary | ICD-10-CM | POA: Diagnosis present

## 2011-10-13 DIAGNOSIS — D62 Acute posthemorrhagic anemia: Secondary | ICD-10-CM | POA: Diagnosis not present

## 2011-10-13 DIAGNOSIS — I4891 Unspecified atrial fibrillation: Secondary | ICD-10-CM | POA: Diagnosis not present

## 2011-10-13 DIAGNOSIS — I10 Essential (primary) hypertension: Secondary | ICD-10-CM | POA: Diagnosis present

## 2011-10-13 DIAGNOSIS — E785 Hyperlipidemia, unspecified: Secondary | ICD-10-CM | POA: Diagnosis present

## 2011-10-13 DIAGNOSIS — I350 Nonrheumatic aortic (valve) stenosis: Secondary | ICD-10-CM | POA: Diagnosis present

## 2011-10-13 DIAGNOSIS — Z96659 Presence of unspecified artificial knee joint: Secondary | ICD-10-CM

## 2011-10-13 DIAGNOSIS — E871 Hypo-osmolality and hyponatremia: Secondary | ICD-10-CM | POA: Diagnosis not present

## 2011-10-13 DIAGNOSIS — E8779 Other fluid overload: Secondary | ICD-10-CM | POA: Diagnosis not present

## 2011-10-13 HISTORY — PX: AORTIC VALVE REPLACEMENT: SHX41

## 2011-10-13 LAB — POCT I-STAT 4, (NA,K, GLUC, HGB,HCT)
Glucose, Bld: 101 mg/dL — ABNORMAL HIGH (ref 70–99)
Glucose, Bld: 105 mg/dL — ABNORMAL HIGH (ref 70–99)
Glucose, Bld: 112 mg/dL — ABNORMAL HIGH (ref 70–99)
Glucose, Bld: 148 mg/dL — ABNORMAL HIGH (ref 70–99)
Glucose, Bld: 141 mg/dL — ABNORMAL HIGH (ref 70–99)
Glucose, Bld: 151 mg/dL — ABNORMAL HIGH (ref 70–99)
Glucose, Bld: 105 mg/dL — ABNORMAL HIGH (ref 70–99)
HCT: 35 % — ABNORMAL LOW (ref 39.0–52.0)
HCT: 25 % — ABNORMAL LOW (ref 39.0–52.0)
HCT: 31 % — ABNORMAL LOW (ref 39.0–52.0)
HCT: 27 % — ABNORMAL LOW (ref 39.0–52.0)
HCT: 26 % — ABNORMAL LOW (ref 39.0–52.0)
HCT: 27 % — ABNORMAL LOW (ref 39.0–52.0)
HCT: 29 % — ABNORMAL LOW (ref 39.0–52.0)
Hemoglobin: 11.9 g/dL — ABNORMAL LOW (ref 13.0–17.0)
Hemoglobin: 10.5 g/dL — ABNORMAL LOW (ref 13.0–17.0)
Hemoglobin: 8.5 g/dL — ABNORMAL LOW (ref 13.0–17.0)
Hemoglobin: 9.2 g/dL — ABNORMAL LOW (ref 13.0–17.0)
Hemoglobin: 8.8 g/dL — ABNORMAL LOW (ref 13.0–17.0)
Hemoglobin: 9.2 g/dL — ABNORMAL LOW (ref 13.0–17.0)
Hemoglobin: 9.9 g/dL — ABNORMAL LOW (ref 13.0–17.0)
Potassium: 3.9 mEq/L (ref 3.5–5.1)
Potassium: 4 mEq/L (ref 3.5–5.1)
Potassium: 4.1 mEq/L (ref 3.5–5.1)
Potassium: 4.6 mEq/L (ref 3.5–5.1)
Potassium: 4 mEq/L (ref 3.5–5.1)
Potassium: 4.6 mEq/L (ref 3.5–5.1)
Potassium: 3.9 mEq/L (ref 3.5–5.1)
Sodium: 139 mEq/L (ref 135–145)
Sodium: 134 mEq/L — ABNORMAL LOW (ref 135–145)
Sodium: 139 mEq/L (ref 135–145)
Sodium: 136 mEq/L (ref 135–145)
Sodium: 138 mEq/L (ref 135–145)
Sodium: 139 mEq/L (ref 135–145)
Sodium: 139 mEq/L (ref 135–145)

## 2011-10-13 LAB — POCT I-STAT 3, ART BLOOD GAS (G3+)
Acid-Base Excess: 2 mmol/L (ref 0.0–2.0)
Acid-base deficit: 1 mmol/L (ref 0.0–2.0)
Acid-base deficit: 3 mmol/L — ABNORMAL HIGH (ref 0.0–2.0)
Acid-base deficit: 2 mmol/L (ref 0.0–2.0)
Bicarbonate: 27 mEq/L — ABNORMAL HIGH (ref 20.0–24.0)
Bicarbonate: 23 mEq/L (ref 20.0–24.0)
Bicarbonate: 22.5 mEq/L (ref 20.0–24.0)
Bicarbonate: 21.9 mEq/L (ref 20.0–24.0)
O2 Saturation: 100 %
O2 Saturation: 97 %
O2 Saturation: 98 %
O2 Saturation: 99 %
Patient temperature: 35.9
Patient temperature: 37.5
Patient temperature: 37.9
TCO2: 28 mmol/L (ref 0–100)
TCO2: 24 mmol/L (ref 0–100)
TCO2: 24 mmol/L (ref 0–100)
TCO2: 23 mmol/L (ref 0–100)
pCO2 arterial: 41.8 mmHg (ref 35.0–45.0)
pCO2 arterial: 34.9 mmHg — ABNORMAL LOW (ref 35.0–45.0)
pCO2 arterial: 40.5 mmHg (ref 35.0–45.0)
pCO2 arterial: 35.1 mmHg (ref 35.0–45.0)
pH, Arterial: 7.419 (ref 7.350–7.450)
pH, Arterial: 7.422 (ref 7.350–7.450)
pH, Arterial: 7.355 (ref 7.350–7.450)
pH, Arterial: 7.406 (ref 7.350–7.450)
pO2, Arterial: 374 mmHg — ABNORMAL HIGH (ref 80.0–100.0)
pO2, Arterial: 82 mmHg (ref 80.0–100.0)
pO2, Arterial: 106 mmHg — ABNORMAL HIGH (ref 80.0–100.0)
pO2, Arterial: 120 mmHg — ABNORMAL HIGH (ref 80.0–100.0)

## 2011-10-13 LAB — CBC
HCT: 31 % — ABNORMAL LOW (ref 39.0–52.0)
HCT: 32 % — ABNORMAL LOW (ref 39.0–52.0)
Hemoglobin: 11.2 g/dL — ABNORMAL LOW (ref 13.0–17.0)
Hemoglobin: 11.5 g/dL — ABNORMAL LOW (ref 13.0–17.0)
MCH: 32.9 pg (ref 26.0–34.0)
MCHC: 35.9 g/dL (ref 30.0–36.0)
MCV: 91.7 fL (ref 78.0–100.0)
MCV: 91.4 fL (ref 78.0–100.0)
Platelets: 122 10*3/uL — ABNORMAL LOW (ref 150–400)
RBC: 3.38 MIL/uL — ABNORMAL LOW (ref 4.22–5.81)
RBC: 3.5 MIL/uL — ABNORMAL LOW (ref 4.22–5.81)
RDW: 12.9 % (ref 11.5–15.5)
RDW: 12.9 % (ref 11.5–15.5)
WBC: 8.1 10*3/uL (ref 4.0–10.5)
WBC: 12.2 10*3/uL — ABNORMAL HIGH (ref 4.0–10.5)

## 2011-10-13 LAB — PROTIME-INR: INR: 1.64 — ABNORMAL HIGH (ref 0.00–1.49)

## 2011-10-13 LAB — CREATININE, SERUM
Creatinine, Ser: 0.78 mg/dL (ref 0.50–1.35)
GFR calc Af Amer: >90 mL/min (ref >90)
GFR calc non Af Amer: 87 mL/min — ABNORMAL LOW (ref >90)

## 2011-10-13 LAB — POCT I-STAT, CHEM 8
BUN: 15 mg/dL (ref 6–23)
Calcium, Ion: 1.16 mmol/L (ref 1.12–1.32)
Chloride: 106 mEq/L (ref 96–112)
Creatinine, Ser: 0.9 mg/dL (ref 0.50–1.35)
Glucose, Bld: 119 mg/dL — ABNORMAL HIGH (ref 70–99)
HCT: 30 % — ABNORMAL LOW (ref 39.0–52.0)
Hemoglobin: 10.2 g/dL — ABNORMAL LOW (ref 13.0–17.0)
Potassium: 4.4 mEq/L (ref 3.5–5.1)
Sodium: 138 mEq/L (ref 135–145)
TCO2: 24 mmol/L (ref 0–100)

## 2011-10-13 LAB — GLUCOSE, CAPILLARY
Glucose-Capillary: 91 mg/dL (ref 70–99)
Glucose-Capillary: 96 mg/dL (ref 70–99)
Glucose-Capillary: 109 mg/dL — ABNORMAL HIGH (ref 70–99)
Glucose-Capillary: 104 mg/dL — ABNORMAL HIGH (ref 70–99)

## 2011-10-13 LAB — PLATELET COUNT: Platelets: 129 10*3/uL — ABNORMAL LOW (ref 150–400)

## 2011-10-13 LAB — APTT: aPTT: 35 seconds (ref 24–37)

## 2011-10-13 LAB — HEMOGLOBIN AND HEMATOCRIT, BLOOD
HCT: 26.8 % — ABNORMAL LOW (ref 39.0–52.0)
Hemoglobin: 9.7 g/dL — ABNORMAL LOW (ref 13.0–17.0)

## 2011-10-13 LAB — MAGNESIUM: Magnesium: 2.9 mg/dL — ABNORMAL HIGH (ref 1.5–2.5)

## 2011-10-13 SURGERY — REPLACEMENT, AORTIC VALVE, OPEN
Anesthesia: General | Site: Chest | Wound class: Clean

## 2011-10-13 MED ORDER — LIDOCAINE HCL (CARDIAC) 20 MG/ML IV SOLN
INTRAVENOUS | Status: DC | PRN
  Administered 2011-10-13: 80 mg via INTRAVENOUS

## 2011-10-13 MED ORDER — DEXTROSE 5 % IV SOLN
1.5000 g | Freq: Two times a day (BID) | INTRAVENOUS | Status: AC
  Administered 2011-10-13 – 2011-10-15 (×4): 1.5 g via INTRAVENOUS
  Filled 2011-10-13 (×5): qty 1.5

## 2011-10-13 MED ORDER — ACETAMINOPHEN 160 MG/5ML PO SOLN: 975.0000 mg | Freq: Four times a day (QID) | ORAL | Status: DC

## 2011-10-13 MED ORDER — PANTOPRAZOLE SODIUM 40 MG PO TBEC: 40.0000 mg | DELAYED_RELEASE_TABLET | Freq: Every day | ORAL | Status: DC

## 2011-10-13 MED ORDER — SODIUM CHLORIDE 0.9 % IV SOLN
200.0000 ug | INTRAVENOUS | Status: DC | PRN
  Administered 2011-10-13: 0.2 ug/kg/h via INTRAVENOUS

## 2011-10-13 MED ORDER — FENTANYL CITRATE 0.05 MG/ML IJ SOLN
INTRAMUSCULAR | Status: DC | PRN
  Administered 2011-10-13 (×2): 100 ug via INTRAVENOUS
  Administered 2011-10-13: 150 ug via INTRAVENOUS
  Administered 2011-10-13: 100 ug via INTRAVENOUS
  Administered 2011-10-13: 200 ug via INTRAVENOUS
  Administered 2011-10-13: 100 ug via INTRAVENOUS
  Administered 2011-10-13: 350 ug via INTRAVENOUS

## 2011-10-13 MED ORDER — METOPROLOL TARTRATE 12.5 MG HALF TABLET
12.5000 mg | ORAL_TABLET | Freq: Two times a day (BID) | ORAL | Status: DC
  Filled 2011-10-13 (×5): qty 1

## 2011-10-13 MED ORDER — ALBUMIN HUMAN 5 % IV SOLN
INTRAVENOUS | Status: DC | PRN
  Administered 2011-10-13: 12:00:00 via INTRAVENOUS

## 2011-10-13 MED ORDER — SODIUM CHLORIDE 0.45 % IV SOLN
INTRAVENOUS | Status: DC
  Administered 2011-10-13: 20 mL/h via INTRAVENOUS

## 2011-10-13 MED ORDER — ACETAMINOPHEN 500 MG PO TABS: 1000.0000 mg | ORAL_TABLET | Freq: Four times a day (QID) | ORAL | Status: DC

## 2011-10-13 MED ORDER — SODIUM CHLORIDE 0.9 % IV SOLN
100.0000 [IU] | INTRAVENOUS | Status: DC | PRN
  Administered 2011-10-13: 1 [IU]/h via INTRAVENOUS

## 2011-10-13 MED ORDER — MIDAZOLAM HCL 5 MG/5ML IJ SOLN
INTRAMUSCULAR | Status: DC | PRN
  Administered 2011-10-13: 2 mg via INTRAVENOUS
  Administered 2011-10-13: 4 mg via INTRAVENOUS
  Administered 2011-10-13: 2 mg via INTRAVENOUS
  Administered 2011-10-13 (×2): 1 mg via INTRAVENOUS

## 2011-10-13 MED ORDER — PHENYLEPHRINE HCL 10 MG/ML IJ SOLN
10.0000 mg | INTRAVENOUS | Status: DC | PRN
  Administered 2011-10-13: 1 ug/min via INTRAVENOUS

## 2011-10-13 MED ORDER — NITROGLYCERIN IN D5W 200-5 MCG/ML-% IV SOLN: 0.0000 ug/min | INTRAVENOUS | Status: DC

## 2011-10-13 MED ORDER — ASPIRIN EC 325 MG PO TBEC
325.0000 mg | DELAYED_RELEASE_TABLET | Freq: Every day | ORAL | Status: DC
  Administered 2011-10-14 – 2011-10-15 (×2): 325 mg via ORAL
  Filled 2011-10-13 (×2): qty 1

## 2011-10-13 MED ORDER — LACTATED RINGERS IV SOLN
INTRAVENOUS | Status: DC
  Administered 2011-10-13: 20 mL/h via INTRAVENOUS
  Administered 2011-10-13 (×2): via INTRAVENOUS

## 2011-10-13 MED ORDER — DOCUSATE SODIUM 100 MG PO CAPS
200.0000 mg | ORAL_CAPSULE | Freq: Every day | ORAL | Status: DC
  Administered 2011-10-14 – 2011-10-15 (×2): 200 mg via ORAL
  Filled 2011-10-13 (×2): qty 2

## 2011-10-13 MED ORDER — BISACODYL 10 MG RE SUPP: 10.0000 mg | Freq: Every day | RECTAL | Status: DC

## 2011-10-13 MED ORDER — MORPHINE SULFATE 2 MG/ML IJ SOLN
1.0000 mg | INTRAMUSCULAR | Status: AC | PRN
  Administered 2011-10-13: 2 mg via INTRAVENOUS
  Filled 2011-10-13: qty 1

## 2011-10-13 MED ORDER — SIMVASTATIN 20 MG PO TABS
20.0000 mg | ORAL_TABLET | Freq: Every day | ORAL | Status: DC
  Administered 2011-10-13 – 2011-10-17 (×5): 20 mg via ORAL
  Filled 2011-10-13 (×6): qty 1

## 2011-10-13 MED ORDER — PROPOFOL 10 MG/ML IV EMUL
INTRAVENOUS | Status: DC | PRN
  Administered 2011-10-13: 70 mg via INTRAVENOUS

## 2011-10-13 MED ORDER — SODIUM CHLORIDE 0.9 % IV SOLN: 250.0000 mL | INTRAVENOUS | Status: DC

## 2011-10-13 MED ORDER — SODIUM CHLORIDE 0.9 % IV SOLN
INTRAVENOUS | Status: DC
  Administered 2011-10-13: 10 mL/h via INTRAVENOUS

## 2011-10-13 MED ORDER — BISACODYL 5 MG PO TBEC
10.0000 mg | DELAYED_RELEASE_TABLET | Freq: Every day | ORAL | Status: DC
  Administered 2011-10-14 – 2011-10-15 (×2): 10 mg via ORAL
  Filled 2011-10-13 (×2): qty 2

## 2011-10-13 MED ORDER — FLUOCINONIDE 0.05 % EX CREA: 1.0000 "application " | TOPICAL_CREAM | Freq: Two times a day (BID) | CUTANEOUS | Status: DC | PRN

## 2011-10-13 MED ORDER — ONDANSETRON HCL 4 MG/2ML IJ SOLN: 4.0000 mg | Freq: Four times a day (QID) | INTRAMUSCULAR | Status: DC | PRN

## 2011-10-13 MED ORDER — ALBUMIN HUMAN 5 % IV SOLN
250.0000 mL | INTRAVENOUS | Status: AC | PRN
  Administered 2011-10-13 (×3): 250 mL via INTRAVENOUS
  Filled 2011-10-13: qty 250

## 2011-10-13 MED ORDER — OMEGA-3-ACID ETHYL ESTERS 1 G PO CAPS
3.0000 g | ORAL_CAPSULE | Freq: Every day | ORAL | Status: DC
  Administered 2011-10-14 – 2011-10-15 (×2): 3 g via ORAL
  Filled 2011-10-13 (×3): qty 3

## 2011-10-13 MED ORDER — ACETAMINOPHEN 650 MG RE SUPP
650.0000 mg | RECTAL | Status: AC
  Administered 2011-10-13: 650 mg via RECTAL

## 2011-10-13 MED ORDER — FAMOTIDINE IN NACL 20-0.9 MG/50ML-% IV SOLN
20.0000 mg | Freq: Two times a day (BID) | INTRAVENOUS | Status: AC
  Administered 2011-10-13 (×2): 20 mg via INTRAVENOUS
  Filled 2011-10-13: qty 50

## 2011-10-13 MED ORDER — DEXMEDETOMIDINE HCL 100 MCG/ML IV SOLN
0.1000 ug/kg/h | INTRAVENOUS | Status: DC
  Administered 2011-10-13: 0.7 ug/kg/h via INTRAVENOUS
  Filled 2011-10-13: qty 2

## 2011-10-13 MED ORDER — ASPIRIN 81 MG PO CHEW: 324.0000 mg | CHEWABLE_TABLET | Freq: Every day | ORAL | Status: DC

## 2011-10-13 MED ORDER — OXYCODONE HCL 5 MG PO TABS
5.0000 mg | ORAL_TABLET | ORAL | Status: DC | PRN
  Administered 2011-10-13 – 2011-10-15 (×10): 10 mg via ORAL
  Filled 2011-10-13 (×10): qty 2

## 2011-10-13 MED ORDER — INSULIN ASPART 100 UNIT/ML ~~LOC~~ SOLN
0.0000 [IU] | SUBCUTANEOUS | Status: AC
  Administered 2011-10-13 – 2011-10-14 (×2): 2 [IU] via SUBCUTANEOUS
  Filled 2011-10-13: qty 3

## 2011-10-13 MED ORDER — ACETAMINOPHEN 160 MG/5ML PO SOLN
975.0000 mg | Freq: Four times a day (QID) | ORAL | Status: DC
  Filled 2011-10-13: qty 40.6

## 2011-10-13 MED ORDER — LACTATED RINGERS IV SOLN: 500.0000 mL | Freq: Once | INTRAVENOUS | Status: AC | PRN

## 2011-10-13 MED ORDER — SODIUM CHLORIDE 0.9 % IJ SOLN: 3.0000 mL | INTRAMUSCULAR | Status: DC | PRN

## 2011-10-13 MED ORDER — LACTATED RINGERS IV SOLN
INTRAVENOUS | Status: DC | PRN
  Administered 2011-10-13 (×4): via INTRAVENOUS

## 2011-10-13 MED ORDER — HEMOSTATIC AGENTS (NO CHARGE) OPTIME
TOPICAL | Status: DC | PRN
  Administered 2011-10-13: 1 via TOPICAL

## 2011-10-13 MED ORDER — METOPROLOL TARTRATE 1 MG/ML IV SOLN: 2.5000 mg | INTRAVENOUS | Status: DC | PRN

## 2011-10-13 MED ORDER — FERROUS FUM-IRON POLYSACCH 162-115.2 MG PO CAPS: 1.0000 | ORAL_CAPSULE | Freq: Every day | ORAL | Status: DC

## 2011-10-13 MED ORDER — MAGNESIUM SULFATE 40 MG/ML IJ SOLN
4.0000 g | Freq: Once | INTRAMUSCULAR | Status: AC
  Administered 2011-10-13: 4 g via INTRAVENOUS
  Filled 2011-10-13: qty 100

## 2011-10-13 MED ORDER — ACETAMINOPHEN 500 MG PO TABS
1000.0000 mg | ORAL_TABLET | Freq: Four times a day (QID) | ORAL | Status: DC
  Administered 2011-10-13 – 2011-10-14 (×4): 1000 mg via ORAL
  Filled 2011-10-13 (×10): qty 2

## 2011-10-13 MED ORDER — SODIUM CHLORIDE 0.9 % IJ SOLN
OROMUCOSAL | Status: DC | PRN
  Administered 2011-10-13: 10:00:00 via TOPICAL

## 2011-10-13 MED ORDER — SODIUM CHLORIDE 0.9 % IV SOLN
INTRAVENOUS | Status: DC
  Filled 2011-10-13: qty 1

## 2011-10-13 MED ORDER — FAMOTIDINE IN NACL 20-0.9 MG/50ML-% IV SOLN: 20.0000 mg | Freq: Two times a day (BID) | INTRAVENOUS | Status: DC

## 2011-10-13 MED ORDER — INSULIN REGULAR BOLUS VIA INFUSION
0.0000 [IU] | Freq: Three times a day (TID) | INTRAVENOUS | Status: DC
  Filled 2011-10-13: qty 10

## 2011-10-13 MED ORDER — SODIUM CHLORIDE 0.9 % IV SOLN
10.0000 g | INTRAVENOUS | Status: DC | PRN
  Administered 2011-10-13: 5 g/h via INTRAVENOUS

## 2011-10-13 MED ORDER — SODIUM CHLORIDE 0.9 % IV SOLN
INTRAVENOUS | Status: DC | PRN
  Administered 2011-10-13: 12:00:00 via INTRAVENOUS

## 2011-10-13 MED ORDER — PHENYLEPHRINE HCL 10 MG/ML IJ SOLN
0.0000 ug/min | INTRAVENOUS | Status: DC
  Administered 2011-10-13: 30 ug/min via INTRAVENOUS
  Filled 2011-10-13 (×2): qty 2

## 2011-10-13 MED ORDER — POTASSIUM CHLORIDE 10 MEQ/50ML IV SOLN: 10.0000 meq | INTRAVENOUS | Status: AC

## 2011-10-13 MED ORDER — HEPARIN SODIUM (PORCINE) 1000 UNIT/ML IJ SOLN
INTRAMUSCULAR | Status: DC | PRN
  Administered 2011-10-13: 27000 [IU] via INTRAVENOUS

## 2011-10-13 MED ORDER — THROMBIN 5000 UNITS EX SOLR
CUTANEOUS | Status: DC | PRN
  Administered 2011-10-13: 5000 [IU] via TOPICAL

## 2011-10-13 MED ORDER — INSULIN ASPART 100 UNIT/ML ~~LOC~~ SOLN: 0.0000 [IU] | SUBCUTANEOUS | Status: DC

## 2011-10-13 MED ORDER — ROCURONIUM BROMIDE 100 MG/10ML IV SOLN
INTRAVENOUS | Status: DC | PRN
  Administered 2011-10-13: 50 mg via INTRAVENOUS
  Administered 2011-10-13: 20 mg via INTRAVENOUS
  Administered 2011-10-13: 30 mg via INTRAVENOUS
  Administered 2011-10-13: 20 mg via INTRAVENOUS
  Administered 2011-10-13: 10 mg via INTRAVENOUS
  Administered 2011-10-13: 20 mg via INTRAVENOUS

## 2011-10-13 MED ORDER — OMEGA-3 FATTY ACIDS 1000 MG PO CAPS: 3.0000 g | ORAL_CAPSULE | Freq: Every day | ORAL | Status: DC

## 2011-10-13 MED ORDER — 0.9 % SODIUM CHLORIDE (POUR BTL) OPTIME
TOPICAL | Status: DC | PRN
  Administered 2011-10-13: 6000 mL

## 2011-10-13 MED ORDER — VANCOMYCIN HCL 1000 MG IV SOLR
1000.0000 mg | Freq: Once | INTRAVENOUS | Status: AC
  Administered 2011-10-14: 1000 mg via INTRAVENOUS
  Filled 2011-10-13: qty 1000

## 2011-10-13 MED ORDER — 0.9 % SODIUM CHLORIDE (POUR BTL) OPTIME
TOPICAL | Status: DC | PRN
  Administered 2011-10-13: 1000 mL

## 2011-10-13 MED ORDER — METOPROLOL TARTRATE 25 MG/10 ML ORAL SUSPENSION
12.5000 mg | Freq: Two times a day (BID) | ORAL | Status: DC
  Filled 2011-10-13 (×5): qty 5

## 2011-10-13 MED ORDER — SODIUM CHLORIDE 0.9 % IJ SOLN
3.0000 mL | Freq: Two times a day (BID) | INTRAMUSCULAR | Status: DC
  Administered 2011-10-14: 3 mL via INTRAVENOUS

## 2011-10-13 MED ORDER — MORPHINE SULFATE 4 MG/ML IJ SOLN
2.0000 mg | INTRAMUSCULAR | Status: DC | PRN
  Administered 2011-10-14 (×3): 4 mg via INTRAVENOUS
  Administered 2011-10-14 – 2011-10-15 (×2): 2 mg via INTRAVENOUS
  Administered 2011-10-15: 4 mg via INTRAVENOUS
  Administered 2011-10-15: 2 mg via INTRAVENOUS
  Filled 2011-10-13 (×6): qty 1

## 2011-10-13 MED ORDER — NITROGLYCERIN IN D5W 200-5 MCG/ML-% IV SOLN
INTRAVENOUS | Status: DC | PRN
  Administered 2011-10-13: 5 ug/min via INTRAVENOUS

## 2011-10-13 MED ORDER — POLYSACCHARIDE IRON 150 MG PO CAPS
150.0000 mg | ORAL_CAPSULE | Freq: Every day | ORAL | Status: DC
  Administered 2011-10-14 – 2011-10-18 (×5): 150 mg via ORAL
  Filled 2011-10-13 (×7): qty 1

## 2011-10-13 MED ORDER — PROTAMINE SULFATE 10 MG/ML IV SOLN
INTRAVENOUS | Status: DC | PRN
  Administered 2011-10-13: 270 mg via INTRAVENOUS
  Administered 2011-10-13: 25 mg via INTRAVENOUS

## 2011-10-13 MED ORDER — VECURONIUM BROMIDE 10 MG IV SOLR
INTRAVENOUS | Status: DC | PRN
  Administered 2011-10-13: 2 mg via INTRAVENOUS
  Administered 2011-10-13: 3 mg via INTRAVENOUS
  Administered 2011-10-13: 5 mg via INTRAVENOUS

## 2011-10-13 MED ORDER — ACETAMINOPHEN 160 MG/5ML PO SOLN: 650.0000 mg | ORAL | Status: AC

## 2011-10-13 MED ORDER — MIDAZOLAM HCL 2 MG/2ML IJ SOLN: 2.0000 mg | INTRAMUSCULAR | Status: DC | PRN

## 2011-10-13 SURGICAL SUPPLY — 97 items
ADAPTER CARDIO PERF ANTE/RETRO (ADAPTER) ×2 IMPLANT
ADPR PRFSN 84XANTGRD RTRGD (ADAPTER) ×1
APPLICATOR COTTON TIP 6IN STRL (MISCELLANEOUS) ×1 IMPLANT
ATTRACTOMAT 16X20 MAGNETIC DRP (DRAPES) ×2 IMPLANT
BAG DECANTER FOR FLEXI CONT (MISCELLANEOUS) ×2 IMPLANT
BLADE SAW STERNAL (BLADE) ×2 IMPLANT
BLADE SURG 11 STRL SS (BLADE) IMPLANT
BLADE SURG 15 STRL LF DISP TIS (BLADE) ×1 IMPLANT
BLADE SURG 15 STRL SS (BLADE) ×2
BOOT SUTURE AID YELLOW STND (SUTURE) ×2 IMPLANT
CANISTER SUCTION 2500CC (MISCELLANEOUS) ×2 IMPLANT
CANNULA GUNDRY RCSP 15FR (MISCELLANEOUS) IMPLANT
CANNULA VENOUS DUAL 32/40 (CANNULA) IMPLANT
CATH HEART VENT LEFT (CATHETERS) ×1 IMPLANT
CATH RETROPLEGIA CORONARY 14FR (CATHETERS) ×2 IMPLANT
CATH/SQUID NICHOLS JEHLE COR (CATHETERS) ×1 IMPLANT
CLIP FOGARTY SPRING 6M (CLIP) IMPLANT
CLOTH BEACON ORANGE TIMEOUT ST (SAFETY) ×2 IMPLANT
CONT SPEC 4OZ CLIKSEAL STRL BL (MISCELLANEOUS) ×1 IMPLANT
COVER MAYO STAND STRL (DRAPES) ×1 IMPLANT
COVER SURGICAL LIGHT HANDLE (MISCELLANEOUS) ×4 IMPLANT
CRADLE DONUT ADULT HEAD (MISCELLANEOUS) ×2 IMPLANT
DRAIN CHANNEL 28F RND 3/8 FF (WOUND CARE) ×1 IMPLANT
DRAIN CHANNEL 32F RND 10.7 FF (WOUND CARE) ×1 IMPLANT
DRAPE CARDIOVASCULAR INCISE (DRAPES) ×2
DRAPE SLUSH MACHINE 52X66 (DRAPES) ×1 IMPLANT
DRAPE SLUSH/WARMER DISC (DRAPES) ×1 IMPLANT
DRAPE SRG 135X102X78XABS (DRAPES) ×1 IMPLANT
DRSG COVADERM 4X14 (GAUZE/BANDAGES/DRESSINGS) ×2 IMPLANT
ELECT BLADE 4.0 EZ CLEAN MEGAD (MISCELLANEOUS) ×2
ELECT CAUTERY BLADE 6.4 (BLADE) IMPLANT
ELECT REM PT RETURN 9FT ADLT (ELECTROSURGICAL) ×4
ELECTRODE BLDE 4.0 EZ CLN MEGD (MISCELLANEOUS) ×1 IMPLANT
ELECTRODE REM PT RTRN 9FT ADLT (ELECTROSURGICAL) ×2 IMPLANT
GLOVE BIO SURGEON STRL SZ 6 (GLOVE) ×3 IMPLANT
GLOVE BIO SURGEON STRL SZ 6.5 (GLOVE) ×7 IMPLANT
GLOVE BIOGEL PI IND STRL 7.0 (GLOVE) IMPLANT
GLOVE BIOGEL PI INDICATOR 7.0 (GLOVE) ×5
GOWN PREVENTION PLUS XLARGE (GOWN DISPOSABLE) ×3 IMPLANT
GOWN STRL NON-REIN LRG LVL3 (GOWN DISPOSABLE) ×8 IMPLANT
HEART VENT LT CURVED (MISCELLANEOUS) ×1 IMPLANT
HEMOSTAT POWDER SURGIFOAM 1G (HEMOSTASIS) ×8 IMPLANT
HEMOSTAT SURGICEL 2X14 (HEMOSTASIS) ×1 IMPLANT
INSERT FOGARTY XLG (MISCELLANEOUS) IMPLANT
KIT BASIN OR (CUSTOM PROCEDURE TRAY) ×2 IMPLANT
KIT PAIN CUSTOM (MISCELLANEOUS) IMPLANT
KIT ROOM TURNOVER OR (KITS) ×2 IMPLANT
KIT SUCTION CATH 14FR (SUCTIONS) IMPLANT
LEAD PACING MYOCARDI (MISCELLANEOUS) ×1 IMPLANT
LINE VENT (MISCELLANEOUS) ×1 IMPLANT
NDL 18GX1X1/2 (RX/OR ONLY) (NEEDLE) ×1 IMPLANT
NEEDLE 18GX1X1/2 (RX/OR ONLY) (NEEDLE) ×2 IMPLANT
NS IRRIG 1000ML POUR BTL (IV SOLUTION) ×13 IMPLANT
PACK OPEN HEART (CUSTOM PROCEDURE TRAY) ×2 IMPLANT
PAD ARMBOARD 7.5X6 YLW CONV (MISCELLANEOUS) ×4 IMPLANT
SET CARDIOPLEGIA MPS 5001102 (MISCELLANEOUS) ×1 IMPLANT
SPONGE GAUZE 4X4 12PLY (GAUZE/BANDAGES/DRESSINGS) ×3 IMPLANT
SPONGE LAP 18X18 X RAY DECT (DISPOSABLE) ×4 IMPLANT
STOPCOCK 4 WAY LG BORE MALE ST (IV SETS) IMPLANT
SUT ETHIBON 2 0 V 52N 30 (SUTURE) ×4 IMPLANT
SUT ETHIBON EXCEL 2-0 V-5 (SUTURE) IMPLANT
SUT ETHIBOND 2 0 SH (SUTURE) ×2
SUT ETHIBOND 2 0 SH 36X2 (SUTURE) IMPLANT
SUT ETHIBOND 2 0 V4 (SUTURE) IMPLANT
SUT ETHIBOND 2 0V4 GREEN (SUTURE) IMPLANT
SUT ETHIBOND V-5 VALVE (SUTURE) IMPLANT
SUT PROLENE 3 0 RB 1 (SUTURE) ×1 IMPLANT
SUT PROLENE 3 0 SH 1 (SUTURE) IMPLANT
SUT PROLENE 3 0 SH1 36 (SUTURE) ×2 IMPLANT
SUT PROLENE 4 0 RB 1 (SUTURE)
SUT PROLENE 4-0 RB1 .5 CRCL 36 (SUTURE) IMPLANT
SUT PROLENE 5 0 C 1 36 (SUTURE) ×4 IMPLANT
SUT SILK  1 MH (SUTURE) ×2
SUT SILK 1 MH (SUTURE) ×2 IMPLANT
SUT SILK 1 TIES 10X30 (SUTURE) ×2 IMPLANT
SUT SILK 2 0 (SUTURE) ×2
SUT SILK 2 0 SH CR/8 (SUTURE) ×4 IMPLANT
SUT SILK 2-0 18XBRD TIE 12 (SUTURE) ×1 IMPLANT
SUT SILK 3 0 SH CR/8 (SUTURE) ×2 IMPLANT
SUT SILK 4 0 (SUTURE) ×2
SUT SILK 4-0 18XBRD TIE 12 (SUTURE) ×1 IMPLANT
SUT STEEL 6MS V (SUTURE) IMPLANT
SUT TEM PAC WIRE 2 0 SH (SUTURE) ×4 IMPLANT
SUT VIC AB 1 CTX 18 (SUTURE) IMPLANT
SUT VIC AB 2-0 CTX 27 (SUTURE) ×2 IMPLANT
SUT VIC AB 3-0 X1 27 (SUTURE) ×2 IMPLANT
SYSTEM SAHARA CHEST DRAIN ATS (WOUND CARE) ×2 IMPLANT
TAPE CLOTH SURG 4X10 WHT LF (GAUZE/BANDAGES/DRESSINGS) ×1 IMPLANT
TAPES RETRACTO (MISCELLANEOUS) ×2 IMPLANT
TOWEL OR 17X24 6PK STRL BLUE (TOWEL DISPOSABLE) ×2 IMPLANT
TOWEL OR 17X26 10 PK STRL BLUE (TOWEL DISPOSABLE) ×2 IMPLANT
TRAY FOLEY IC TEMP SENS 16FR (CATHETERS) ×2 IMPLANT
TUBE FEEDING 8FR 16IN STR KANG (MISCELLANEOUS) ×2 IMPLANT
UNDERPAD 30X30 INCONTINENT (UNDERPADS AND DIAPERS) ×2 IMPLANT
VALVE AORTIC MAGNA 25MM (Prosthesis & Implant Heart) ×1 IMPLANT
VENT LEFT HEART 12002 (CATHETERS) ×2
WATER STERILE IRR 1000ML POUR (IV SOLUTION) ×4 IMPLANT

## 2011-10-13 NOTE — Brief Op Note (Addendum)
10/13/2011  10:51 AM  PATIENT:  Nicholas Walls  76 y.o. male  PRE-OPERATIVE DIAGNOSIS:  Aortic stenosis  POST-OPERATIVE DIAGNOSIS:  Aortic stenosis, with calcified ascending aorta  PROCEDURE:  Procedure(s): AORTIC VALVE REPLACEMENT (AVR) using a Magna Ease pericardial tissue valve (size 25 mm)  SURGEON:  Dr. Tyrone Sage  PHYSICIAN ASSISTANT: Doree Fudge PA-C   ANESTHESIA:   general  EBL:  Total I/O In: -  Out: 730 [Urine:730]   DRAINS: Nasogastric Tube, Urinary Catheter (Foley) and (2) Blake drain(s) in the mediastinum    COUNTS CORRESCT:  YES   DICTATION: .Dragon Dictation  PLAN OF CARE: Admit to inpatient   PATIENT DISPOSITION:  ICU - intubated and hemodynamically stable.   Delay start of Pharmacological VTE agent (>24hrs) due to surgical blood loss or risk of bleeding: yes  PRE OP WEIGHT 101 kg

## 2011-10-13 NOTE — Anesthesia Preprocedure Evaluation (Signed)
Anesthesia Evaluation  Patient identified by MRN, date of birth, ID band Patient awake    Airway Mallampati: II      Dental   Pulmonary sleep apnea ,          Cardiovascular hypertension,     Neuro/Psych    GI/Hepatic negative GI ROS, Neg liver ROS,   Endo/Other  Negative Endocrine ROS  Renal/GU negative Renal ROS     Musculoskeletal   Abdominal   Peds  Hematology negative hematology ROS (+)   Anesthesia Other Findings   Reproductive/Obstetrics                           Anesthesia Physical Anesthesia Plan  ASA: IV  Anesthesia Plan: General   Post-op Pain Management:    Induction: Intravenous  Airway Management Planned: Oral ETT  Additional Equipment: PA Cath and Arterial line  Intra-op Plan:   Post-operative Plan:   Informed Consent: I have reviewed the patients History and Physical, chart, labs and discussed the procedure including the risks, benefits and alternatives for the proposed anesthesia with the patient or authorized representative who has indicated his/her understanding and acceptance.     Plan Discussed with: CRNA  Anesthesia Plan Comments:         Anesthesia Quick Evaluation

## 2011-10-13 NOTE — Preoperative (Signed)
Beta Blockers   Reason not to administer Beta Blockers:Not Applicable 

## 2011-10-13 NOTE — Progress Notes (Signed)
*  PRELIMINARY RESULTS* Echocardiogram Echocardiogram Transesophageal has been performed.  Clide Deutscher 10/13/2011, 9:36 AM

## 2011-10-13 NOTE — Anesthesia Postprocedure Evaluation (Signed)
  Anesthesia Post-op Note  Patient: Nicholas Walls  Procedure(s) Performed: Procedure(s) (LRB): AORTIC VALVE REPLACEMENT (AVR) (N/A)  Patient Location: PACU and SICU  Anesthesia Type: General  Level of Consciousness: awake  Airway and Oxygen Therapy: Patient Spontanous Breathing  Post-op Pain: mild  Post-op Assessment: Post-op Vital signs reviewed  Post-op Vital Signs: stable  Complications: No apparent anesthesia complications

## 2011-10-13 NOTE — Progress Notes (Signed)
TCTS BRIEF SICU PROGRESS NOTE  Day of Surgery  S/P Procedure(s) (LRB): AORTIC VALVE REPLACEMENT (AVR) (N/A)   Extubated uneventfully Stable hemodynamics Chest tube output low UOP adquate Labs okay  Plan: Continue routine early postop  Nicholas Walls H 10/13/2011 9:10 PM

## 2011-10-13 NOTE — Anesthesia Procedure Notes (Signed)
Procedure Name: Intubation Date/Time: 10/13/2011 7:35 AM Performed by: Caryn Bee Pre-anesthesia Checklist: Patient identified, Emergency Drugs available, Suction available, Patient being monitored and Timeout performed Patient Re-evaluated:Patient Re-evaluated prior to inductionOxygen Delivery Method: Circle System Utilized Preoxygenation: Pre-oxygenation with 100% oxygen Intubation Type: IV induction Ventilation: Mask ventilation without difficulty and Oral airway inserted - appropriate to patient size Laryngoscope Size: Hyacinth Meeker and 2 Grade View: Grade I Tube type: Oral Tube size: 7.5 mm Number of attempts: 1 Airway Equipment and Method: stylet Placement Confirmation: ETT inserted through vocal cords under direct vision,  positive ETCO2,  CO2 detector and breath sounds checked- equal and bilateral Secured at: 21 cm Tube secured with: Tape Dental Injury: Teeth and Oropharynx as per pre-operative assessment

## 2011-10-13 NOTE — Procedures (Signed)
Extubation Procedure Note  Patient Details:   Name: Nicholas Walls DOB: 10-22-1935 MRN: [id number]   Airway Documentation:  Airway 7.5 mm (Active)  Secured at (cm) 21 cm 10/13/2011  1:24 PM  Measured From Lips 10/13/2011  1:24 PM    Evaluation  O2 sats: stable throughout Complications: No apparent complications Patient did tolerate procedure well. Bilateral Breath Sounds: Diminished   Yes  NIF -20, FVC 1.2L. Pt extubated to 3L [place] with no complications. No stridor.  Fredrich Birks 10/13/2011, 7:22 PM

## 2011-10-13 NOTE — H&P (Signed)
[address]            [place] [address]          [phone]      Nicholas Walls [facility] Medical Record #[id number] Date of Birth: 04-Sep-1935  Referring: Dr Patty Sermons,  MD Primary Care: Lorelei Pont, DO, DO  Chief Complaint:    Aortic Stenosis   History of Present Illness:     Patient is a 76 year old male who said known aortic stenosis. He first had a heart murmur noticed in 2006. At that time an echocardiogram showed mild to moderate aortic stenosis with peak gradient of 25 and a mean gradient of 15 with a calculated aortic valve area of 1.6 cm. He also had mild aortic insufficiency. Serial echocardiograms have confirmed progressive aortic stenosis.   The patient denies angina syncope, pedal edema, nocturnal dyspnea. He does have  orthopnea. He does note fatigue especially while working. He does have exertional dyspnea which seems to be getting worse. His wife notes that she has a diagnosis of COPD. In the past she would become short of breath long before he did while walking. Now he becomes more dyspneic before she does. He does have a history of sleep apnea.  Because of worsening aortic valvular stenosis and increasing symptoms of dyspnea on exertion the patient is referred for consideration of aortic valve replacement  Current Activity/ Functional Status: Patient is independent with mobility/ambulation, transfers, ADL's, IADL's.   Past Medical History  Diagnosis Date  . Hyperlipidemia   . Aortic stenosis, severe   . Osteoarthritis      End-stage osteoarthritis--left knee  . History of hiatal hernia   . History of anemia of chronic disease   . Hyponatremia     Mild hyponatremia, allowed to self-correct, asymptomatic  . Sleep apnea      4 yrs ago     no cpap  . Aortic valve defect   . Hypertension     dr Patty Sermons     Past Surgical History  Procedure Date  . Cholecystectomy   . Total knee arthroplasty       bilateral  . Joint replacement     bil knees  . Cervical disc surgery   . Cardiac catheterization     Family History  Problem Relation Age of Onset  . Cancer Mother  age 57    throat  . Heart failure Father   . Cancer Father Age 13    Lung Cancer  . Hypertension Sister     History   Social History  . Marital Status: Married    Spouse Name: N/A    Number of Children: N/A  . Years of Education: N/A   Occupational History  . Not on file.   Social History Main Topics  . Smoking status: Former Smoker    Types: Cigarettes    Quit date: 09/01/1981  . Smokeless tobacco: Never Used  . Alcohol Use: Yes  . Drug Use: No  .        History  Smoking status  . Former Smoker  . Types: Cigarettes  . Quit date: 09/01/1981  Smokeless tobacco  . Never Used    History  Alcohol Use  . 1.2 oz/week  . 2 Cans of beer per week  weekly     No Known Allergies  Prescriptions prior to admission  Medication Sig Dispense Refill  . aspirin 81 MG tablet Take 81 mg by mouth daily.        . ferrous fumarate-iron polysaccharide complex (TANDEM) 162-115.2 MG CAPS Take 1 capsule by mouth daily with breakfast.        . fish oil-omega-3 fatty acids 1000 MG capsule Take 3 g by mouth daily.       . fluocinonide cream (LIDEX) 0.05 % Apply 1 application topically 2 (two) times daily as needed. Apply to hand rash as needed.      [name] losartan (COZAAR) 50 MG tablet Take 50 mg by mouth daily.        . naproxen sodium (ANAPROX) 220 MG tablet Take 220 mg by mouth 3 (three) times daily with meals.       . simvastatin (ZOCOR) 20 MG tablet Take 20 mg by mouth at bedtime.             Review of Systems:     Cardiac Review of Systems: Y or N  Chest Pain [  n  ]  Resting SOB [ n  ] Exertional SOB  [ y ]  Orthopnea [ y]   Pedal Edema [  n ]    Palpitations [ n ] Syncope  [ n ]   Presyncope [n   ]  General Review of Systems: [Y] = yes [  ]=no Constitional: recent weight change [ n ]; anorexia [  n]; fatigue [place]  ]; nausea [ n ]; night sweats [n  ]; fever [ n ]; or chills [ n ];                                                                                                                                          Dental: poor dentition[ n]; Last Dentist visit: no teeth  Eye : blurred vision [  ]; diplopia [   ]; vision changes [  ];  Amaurosis fugax[  ]; Resp: cough [ n ];  wheezing[n  ];  hemoptysis[  ]; shortness of breath[  ]; paroxysmal nocturnal dyspnea[  ]; dyspnea on exertion[  ]; or orthopnea[  ];  GI:  gallstones[  ], vomiting[  ];  dysphagia[  ]; melena[  ];  hematochezia [  ]; heartburn[  ];   Hx of  Colonoscopy[ y ]; GU: kidney stones [  ]; hematuria[n  ];   dysuria [ n ];  nocturia[  ];  history of     obstruction [  ];             Skin: rash, swelling[  ];, hair loss[n  ];  peripheral edema[  ];  or itching[  ]; Musculosketetal: myalgias[ y ];  joint swelling[y  ];  joint erythema[y  ];  joint pain[ y ];  back pain[ y ];  Heme/Lymph: bruising[ n ];  bleeding[ n ];  anemia[y  ];  Neuro: TIA[  ];  headaches[  ];  stroke[  ];  vertigo[ y ];  seizures[ n ];   paresthesias[n  ];  difficulty walking[ n ];  Psych:depression[  ]; anxiety[  ];  Endocrine: diabetes[  ];  thyroid dysfunction[  ];  Immunizations: Flu [ y ]; Pneumococcal[ y ];  Other:  Physical Exam: BP 123/81  Pulse 83  Temp(Src) 98 F (36.7 C) (Oral)  Resp 18  Ht 6' 2.02" (1.88 m)  Wt 222 lb 10.6 oz (101 kg)  BMI 28.58 kg/m2  SpO2 98%  General appearance: alert, cooperative, appears stated age and no distress Neurologic: intact Heart: regular rate and rhythm and systolic murmur: holosystolic 4/6, blowing throughout the precordium Lungs: clear to auscultation bilaterally Abdomen: soft, non-tender; bowel sounds normal; no masses,  no organomegaly Extremities: extremities normal, atraumatic, no cyanosis or edema, Homans sign is negative, no sign of DVT and no edema, redness or tenderness in the calves or  thighs   Diagnostic Studies & Laboratory data:     Recent Radiology Findings:  Dg Chest 2 View  09/04/2011  *RADIOLOGY REPORT*  Clinical Data: Chest pain, preop for cardiac catheterization  CHEST - 2 VIEW  Comparison: Chest x-ray of 08/20/2010  Findings: The lungs are clear but hyperaerated consistent with COPD.  There is peribronchial thickening indicative of bronchitis. Mediastinal contours are stable.  The heart is mildly enlarged and stable.  No acute bony abnormality is seen.  IMPRESSION: Hyperaeration consistent with COPD.  No active lung disease.  Original Report Authenticated By: Juline Patch, M.D.   US Abdomen Complete: done to evauate   ECHO 05/2011 Study Conclusions  - Left ventricle: The cavity size was normal. Wall thickness was increased in a pattern of mild LVH. Systolic function was normal. The estimated ejection fraction was in the range of 55% to 65%. Wall motion was normal; there were no regional wall motion abnormalities. Features are consistent with a pseudonormal left ventricular filling pattern, with concomitant abnormal relaxation and increased filling pressure (grade 2 diastolic dysfunction). - Aortic valve: There was severe stenosis. Mild regurgitation. - Mitral valve: Calcified annulus. Mildly thickened leaflets . Mild regurgitation. - Left atrium: The atrium was mildly dilated. - Right ventricle: The cavity size was mildly dilated. - Atrial septum: No defect or patent foramen ovale was identified. Transthoracic echocardiography. M-mode, complete 2D, spectral Doppler, and color Doppler. Height: Height: 188cm. Height: 74in. Weight: Weight: 95.3kg. Weight: 209.6lb. Body mass index: BMI: 27kg/m^2. Body surface area: BSA: 2.66m^2. Blood pressure: 130/70. Patient status: Outpatient. Location: [facility]   aortic vale velocity 405 cm/sec Peak grad by echo 64 mean 38 AVA o.8   Recent Lab Findings: Lab Results  Component Value Date   WBC 4.7  10/09/2011   HGB 14.3 10/09/2011   HCT 40.0 10/09/2011   PLT 155 10/09/2011   GLUCOSE 98 10/09/2011   ALT 20 10/09/2011   AST 20 10/09/2011   NA 138 10/09/2011   K 4.4 10/09/2011   CL 103 10/09/2011   CREATININE 0.82 10/09/2011   BUN 15 10/09/2011   CO2 24 10/09/2011   INR 1.04 10/09/2011   HGBA1C 4.9 10/09/2011   Cardiac Cath: Procedural Findings:  Hemodynamics  RA 5/3 mean 2 mmHg  RV 31/6 mmHg  PA 24/10 mean 17 mmHg  PCWP 14/14 mean 10 mmHg  LV 179/15 mmHg  AO 142/72 mean  102 mmHg  Oxygen saturations:  PA 70%  AO 90%  Cardiac Output (Fick) 7.8 L/min  Cardiac Index (Fick) 3.4 L/min/m2  Aortic valve mean gradient 40 mm Hg  Aortic valve area 1.1 cm2, index .5  Coronary angiography:  Coronary dominance: right  Left mainstem: Normal  Left anterior descending (LAD): normal  Left circumflex (LCx): normal  Right coronary artery (RCA): normal  Left ventriculography: Left ventricular systolic function is normal, LVEF is estimated at 60-65%, there is no significant mitral regurgitation  Final Conclusions:  1. Normal coronary anatomy  2. Normal LV function  3. Severe aortic stenosis  4. Normal right heart pressures.  Recommendations: Refer for AV replacement.  Thedora Hinders, [facility]  09/23/2011, 11:22 AM     Assessment / Plan:     Symptomatic aortic stenosis in 76 year old male without significant coronary artery disease Agree with Dr. [place] to proceed with aortic valve replacement. The risks and options and the nature of the procedure were discussed with the patient and his wife in detail. The options of mechanical versus tissue valve were also discussed. With the patient's age tissue valve seems most appropriate and the patient is agreeable with this.  The goals risks and alternatives of the planned surgical procedure AVR have been discussed with the patient in detail. The risks of the procedure including death, infection, stroke, myocardial infarction,heart block,  bleeding, blood transfusion have  all been discussed specifically.  I have quoted [name] a 4 % of perioperative mortality and a complication rate as high as 20%. The patient's questions have been answered.Jude Hermenia Bers is willing  to proceed with the planned procedure.  Admitted today  February 11 for AVR  [name] MD  Beeper [phone] Office [phone] 10/13/2011 7:11 AM

## 2011-10-13 NOTE — Transfer of Care (Signed)
Immediate Anesthesia Transfer of Care Note  Patient: Nicholas Walls  Procedure(s) Performed:  AORTIC VALVE REPLACEMENT (AVR)  Patient Location: SICU  Anesthesia Type: General  Level of Consciousness: sedated  Airway & Oxygen Therapy: Patient remains intubated per anesthesia plan  Post-op Assessment: Report given to PACU RN and Post -op Vital signs reviewed and stable  Post vital signs: Reviewed and stable  Complications: No apparent anesthesia complications

## 2011-10-14 ENCOUNTER — Inpatient Hospital Stay (HOSPITAL_COMMUNITY): Payer: Medicare Other

## 2011-10-14 ENCOUNTER — Encounter (HOSPITAL_COMMUNITY): Payer: Self-pay | Admitting: Cardiothoracic Surgery

## 2011-10-14 LAB — CBC
HCT: 27.3 % — ABNORMAL LOW (ref 39.0–52.0)
HCT: 26.8 % — ABNORMAL LOW (ref 39.0–52.0)
Hemoglobin: 9.8 g/dL — ABNORMAL LOW (ref 13.0–17.0)
Hemoglobin: 9.7 g/dL — ABNORMAL LOW (ref 13.0–17.0)
MCH: 33 pg (ref 26.0–34.0)
MCH: 33.6 pg (ref 26.0–34.0)
MCHC: 35.9 g/dL (ref 30.0–36.0)
MCHC: 36.2 g/dL — ABNORMAL HIGH (ref 30.0–36.0)
MCV: 91.9 fL (ref 78.0–100.0)
MCV: 92.7 fL (ref 78.0–100.0)
Platelets: 91 10*3/uL — ABNORMAL LOW (ref 150–400)
Platelets: 78 10*3/uL — ABNORMAL LOW (ref 150–400)
RBC: 2.97 MIL/uL — ABNORMAL LOW (ref 4.22–5.81)
RBC: 2.89 MIL/uL — ABNORMAL LOW (ref 4.22–5.81)
RDW: 13 % (ref 11.5–15.5)
RDW: 13 % (ref 11.5–15.5)
WBC: 6.4 10*3/uL (ref 4.0–10.5)
WBC: 6.1 10*3/uL (ref 4.0–10.5)

## 2011-10-14 LAB — POCT I-STAT, CHEM 8
BUN: 19 mg/dL (ref 6–23)
Calcium, Ion: 1.14 mmol/L (ref 1.12–1.32)
Chloride: 100 mEq/L (ref 96–112)
Creatinine, Ser: 0.9 mg/dL (ref 0.50–1.35)
Glucose, Bld: 110 mg/dL — ABNORMAL HIGH (ref 70–99)
HCT: 27 % — ABNORMAL LOW (ref 39.0–52.0)
Hemoglobin: 9.2 g/dL — ABNORMAL LOW (ref 13.0–17.0)
Potassium: 4.1 mEq/L (ref 3.5–5.1)
Sodium: 135 mEq/L (ref 135–145)
TCO2: 25 mmol/L (ref 0–100)

## 2011-10-14 LAB — GLUCOSE, CAPILLARY
Glucose-Capillary: 106 mg/dL — ABNORMAL HIGH (ref 70–99)
Glucose-Capillary: 108 mg/dL — ABNORMAL HIGH (ref 70–99)
Glucose-Capillary: 121 mg/dL — ABNORMAL HIGH (ref 70–99)
Glucose-Capillary: 122 mg/dL — ABNORMAL HIGH (ref 70–99)
Glucose-Capillary: 122 mg/dL — ABNORMAL HIGH (ref 70–99)
Glucose-Capillary: 123 mg/dL — ABNORMAL HIGH (ref 70–99)
Glucose-Capillary: 128 mg/dL — ABNORMAL HIGH (ref 70–99)
Glucose-Capillary: 130 mg/dL — ABNORMAL HIGH (ref 70–99)
Glucose-Capillary: 96 mg/dL (ref 70–99)

## 2011-10-14 LAB — BASIC METABOLIC PANEL
Calcium: 7.8 mg/dL — ABNORMAL LOW (ref 8.4–10.5)
GFR calc Af Amer: >90 mL/min (ref >90)
GFR calc non Af Amer: 87 mL/min — ABNORMAL LOW (ref >90)
Potassium: 4.1 mEq/L (ref 3.5–5.1)
Sodium: 135 mEq/L (ref 135–145)

## 2011-10-14 LAB — MAGNESIUM
Magnesium: 2.3 mg/dL (ref 1.5–2.5)
Magnesium: 2.3 mg/dL (ref 1.5–2.5)

## 2011-10-14 MED ORDER — INSULIN ASPART 100 UNIT/ML ~~LOC~~ SOLN
0.0000 [IU] | SUBCUTANEOUS | Status: DC
  Administered 2011-10-14 – 2011-10-15 (×4): 2 [IU] via SUBCUTANEOUS

## 2011-10-14 MED FILL — Heparin Sodium (Porcine) Inj 1000 Unit/ML: INTRAMUSCULAR | Qty: 10 | Status: AC

## 2011-10-14 MED FILL — Nitroglycerin IV Soln 5 MG/ML: INTRAVENOUS | Qty: 10 | Status: AC

## 2011-10-14 MED FILL — Lactated Ringer's Solution: INTRAVENOUS | Qty: 500 | Status: AC

## 2011-10-14 MED FILL — Magnesium Sulfate Inj 50%: INTRAMUSCULAR | Qty: 2 | Status: AC

## 2011-10-14 MED FILL — Dexmedetomidine HCl IV Soln 200 MCG/2ML: INTRAVENOUS | Qty: 2 | Status: AC

## 2011-10-14 MED FILL — Verapamil HCl IV Soln 2.5 MG/ML: INTRAVENOUS | Qty: 4 | Status: AC

## 2011-10-14 MED FILL — Potassium Chloride Inj 2 mEq/ML: INTRAVENOUS | Qty: 40 | Status: AC

## 2011-10-14 NOTE — Clinical Documentation Improvement (Signed)
Anemia Blood Loss Clarification  THIS DOCUMENT IS NOT A PERMANENT PART OF THE MEDICAL RECORD  RESPOND TO THE THIS QUERY, FOLLOW THE INSTRUCTIONS BELOW:  1. If needed, update documentation for the patient's encounter via the notes activity.  2. Access this query again and click edit on the In [employer].  3. After updating, or not, click F2 to complete all highlighted (required) fields concerning your review. Select "additional documentation in the medical record" OR "no additional documentation provided".  4. Click Sign note button.  5. The deficiency will fall out of your In Basket *Please let us know if you are not able to complete this workflow by phone or e-mail (listed below).        10/14/11  Dear Dr.Cyndy Braver Marton Redwood  In an effort to better capture your patient's severity of illness, reflect appropriate length of stay and utilization of resources, a review of the patient medical record has revealed the following indicators.    Based on your clinical judgment, please clarify and document in a progress note and/or discharge summary the clinical condition associated with the following supporting information:   Possible Clinical Conditions?   " Expected Acute Blood Loss Anemia  " Acute Blood Loss Anemia  " Precipitous drop in Hematocrit  " Other Condition________________  " Cannot Clinically Determine    Supporting Information:  Diagnostics: H&H: 2/12:   9.8/27.3 2/11:   11.5/32.0  IV fluids / plasma expanders: 2/11:  Albumin human 5% solution, x2  Reviewed: additional documentation in the medical record  Thank You,  Marciano Sequin,  Clinical Documentation Specialist:  Pager: [phone]  Health Information Management [place]

## 2011-10-14 NOTE — Progress Notes (Signed)
UR Completed.  Manda Holstad Jane [phone] 10/14/2011  

## 2011-10-14 NOTE — Progress Notes (Signed)
Patient ID: Nicholas Walls, male   DOB: 10/08/1935, 76 y.o.   MRN: [id number] TCTS DAILY PROGRESS NOTE                   [address]            [name] [address]          [phone]      1 Day Post-Op Procedure(s) (LRB): AORTIC VALVE REPLACEMENT (AVR) (N/A)  Total Length of Stay:  LOS: 1 day   Subjective: Awake and alert, neuro intact  Objective: Vital signs in last 24 hours: Temp:  [96.4 F (35.8 C)-100.9 F (38.3 C)] 99.5 F (37.5 C) (02/12 0700) Pulse Rate:  [90-104] 104  (02/12 0700) Cardiac Rhythm:  [-] Normal sinus rhythm (02/12 0400) Resp:  [11-20] 14  (02/12 0700) BP: (74-108)/(35-64) 108/46 mmHg (02/12 0700) SpO2:  [90 %-100 %] 95 % (02/12 0700) Arterial Line BP: (85-141)/(39-74) 141/46 mmHg (02/12 0700) FiO2 (%):  [40 %-50.4 %] 40.3 % (02/11 1915) Weight:  [229 lb 8 oz (104.1 kg)] 229 lb 8 oz (104.1 kg) (02/12 0400)  Filed Weights   10/12/11 1500 10/14/11 0400  Weight: 222 lb 10.6 oz (101 kg) 229 lb 8 oz (104.1 kg)    Weight change: 6 lb 13.4 oz (3.1 kg)   Hemodynamic parameters for last 24 hours: PAP: (19-32)/(4-21) 30/16 mmHg CO:  [3.5 L/min-9.2 L/min] 8 L/min CI:  [1.5 L/min/m2-4.1 L/min/m2] 3.5 L/min/m2  Intake/Output from previous day: 02/11 0701 - 02/12 0700 In: 8897.6 [P.O.:420; I.V.:5862.6; Blood:965; IV Piggyback:1600] Out: 4030 [Urine:2450; Blood:1125; Chest Tube:455]      Current Meds: Scheduled Meds:   . acetaminophen (TYLENOL) oral liquid 160 mg/5 mL  650 mg Per Tube NOW   Or  . acetaminophen  650 mg Rectal NOW  . acetaminophen  1,000 mg Oral Q6H   Or  . acetaminophen (TYLENOL) oral liquid 160 mg/5 mL  975 mg Per Tube Q6H  . aspirin EC  325 mg Oral Daily   Or  . aspirin  324 mg Per Tube Daily  . bisacodyl  10 mg Oral Daily   Or  . bisacodyl  10 mg Rectal Daily  . cefUROXime (ZINACEF)  IV  1.5 g Intravenous To OR  . cefUROXime (ZINACEF)  IV  1.5 g Intravenous Q12H  . docusate sodium  200 mg Oral Daily  .  famotidine (PEPCID) IV  20 mg Intravenous Q12H  . insulin aspart  0-24 Units Subcutaneous Q2H   Followed by  . insulin aspart  0-24 Units Subcutaneous Q4H  . insulin regular  0-10 Units Intravenous TID WC  . magnesium sulfate  4 g Intravenous Once  . metoprolol tartrate  12.5 mg Oral BID   Or  . metoprolol tartrate  12.5 mg Per Tube BID  . nitroglycerin/verapamil/heparin/sodium bicarbonate solution irrigation for artery spasm   Irrigation To OR  . omega-3 acid ethyl esters  3 g Oral Daily  . pantoprazole  40 mg Oral Q1200  . polysaccharide iron  150 mg Oral Q breakfast  . potassium chloride  10 mEq Intravenous Q1 Hr x 3  . simvastatin  20 mg Oral QHS  . sodium chloride  3 mL Intravenous Q12H  . vancomycin (VANCOCIN) IVPB 1000 mg/100 mL central line  1,000 mg Intravenous Once  . vancomycin  1,500 mg Intravenous To OR   Continuous Infusions:   . sodium chloride 20 mL/hr at 10/13/11 1900  . sodium chloride 10  mL/hr (10/13/11 1330)  . sodium chloride    . dexmedetomidine (PRECEDEX) IV infusion Stopped (10/13/11 1600)  . insulin (NOVOLIN-R) infusion Stopped (10/13/11 2030)  . lactated ringers 30 mL/hr at 10/13/11 2300  . nitroGLYCERIN Stopped (10/13/11 1353)  . phenylephrine (NEO-SYNEPHRINE) Adult infusion Stopped (10/14/11 0345)   PRN Meds:.albumin human, fluocinonide cream, lactated ringers, metoprolol, midazolam, morphine injection, morphine, ondansetron (ZOFRAN) IV, oxyCODONE, sodium chloride, DISCONTD: 0.9 % irrigation (POUR BTL), DISCONTD: 0.9 % irrigation (POUR BTL), DISCONTD: hemostatic agents, DISCONTD: hemostatic agents, DISCONTD: Surgifoam 1 Gm with 0.9% sodium chloride (4 ml) topical solution, DISCONTD: thrombin  General appearance: alert, cooperative and no distress Neurologic: intact Heart: regular rate and rhythm, S1, S2 normal, no murmur, click, rub or gallop Lungs: clear to auscultation bilaterally Abdomen: soft, non-tender; bowel sounds normal; no masses,  no  organomegaly Extremities: extremities normal, atraumatic, no cyanosis or edema and Homans sign is negative, no sign of DVT Wound: stable sternum no murmur of AI  Lab Results: CBC: Basename 10/14/11 0420 10/13/11 1926  WBC 6.4 12.2*  HGB 9.8* 11.5*  HCT 27.3* 32.0*  PLT 91* 122*   BMET:  Basename 10/14/11 0420 10/13/11 1926 10/13/11 1922  NA 135 -- 138  K 4.1 -- 4.4  CL 105 -- 106  CO2 24 -- --  GLUCOSE 115* -- 119*  BUN 15 -- 15  CREATININE 0.80 0.78 --  CALCIUM 7.8* -- --    PT/INR:  Basename 10/13/11 1330  LABPROT 19.7*  INR 1.64*   Radiology: Dg Chest Portable 1 View  10/13/2011  *RADIOLOGY REPORT*  Clinical Data: Status post open heart surgery.  PORTABLE CHEST - 1 VIEW  Comparison: 10/09/2011  Findings: Endotracheal tube is in place with tip 7.1 cm above carina.  Swan-Ganz catheter tip overlies the level of the right pulmonary artery.  Nasogastric tube is in place with tip off the film but beyond the gastroesophageal junction.  The patient has had median sternotomy and valve replacement.  Heart is enlarged.  There are perihilar changes of pulmonary edema and bibasilar atelectasis.  There are small bilateral pleural effusions.  No evidence for pneumothorax.  IMPRESSION:  1.  Postoperative appearance. 2.  Cardiomegaly, mild edema. 3.  Bilateral atelectasis and small effusions.  Original Report Authenticated By: Patterson Hammersmith, M.D.     Assessment/Plan: S/P Procedure(s) (LRB): AORTIC VALVE REPLACEMENT (AVR) (N/A) Mobilize Diuresis d/c tubes/lines See progression orders  [name] MD  Beeper [phone] Office [phone] 10/14/2011 7:30 AM

## 2011-10-14 NOTE — Progress Notes (Signed)
Patient ID: Nicholas Walls, male   DOB: [birth year], 76 y.o.   MRN: [id number] BP 132/70  Pulse 132  Temp(Src) 98.2 F (36.8 C) (Oral)  Resp 22  Ht 6' 2.02" (1.88 m)  Wt 229 lb 8 oz (104.1 kg)  BMI 29.45 kg/m2  SpO2 92%  Stable day Walked in unit Holding sinus [name] MD  Beeper [phone] Office [phone] 10/14/2011 7:29 PM

## 2011-10-15 ENCOUNTER — Inpatient Hospital Stay (HOSPITAL_COMMUNITY): Payer: Medicare Other

## 2011-10-15 LAB — GLUCOSE, CAPILLARY
Glucose-Capillary: 114 mg/dL — ABNORMAL HIGH (ref 70–99)
Glucose-Capillary: 116 mg/dL — ABNORMAL HIGH (ref 70–99)
Glucose-Capillary: 117 mg/dL — ABNORMAL HIGH (ref 70–99)
Glucose-Capillary: 122 mg/dL — ABNORMAL HIGH (ref 70–99)
Glucose-Capillary: 122 mg/dL — ABNORMAL HIGH (ref 70–99)
Glucose-Capillary: 122 mg/dL — ABNORMAL HIGH (ref 70–99)

## 2011-10-15 LAB — CBC
HCT: 25.8 % — ABNORMAL LOW (ref 39.0–52.0)
Hemoglobin: 9.3 g/dL — ABNORMAL LOW (ref 13.0–17.0)
MCH: 33.6 pg (ref 26.0–34.0)
MCHC: 36 g/dL (ref 30.0–36.0)
MCV: 93.1 fL (ref 78.0–100.0)
Platelets: 69 10*3/uL — ABNORMAL LOW (ref 150–400)
RBC: 2.77 MIL/uL — ABNORMAL LOW (ref 4.22–5.81)
RDW: 12.9 % (ref 11.5–15.5)
WBC: 6.3 10*3/uL (ref 4.0–10.5)

## 2011-10-15 LAB — BASIC METABOLIC PANEL
BUN: 17 mg/dL (ref 6–23)
CO2: 27 mEq/L (ref 19–32)
Calcium: 8.1 mg/dL — ABNORMAL LOW (ref 8.4–10.5)
Chloride: 99 mEq/L (ref 96–112)
Creatinine, Ser: 0.76 mg/dL (ref 0.50–1.35)
GFR calc Af Amer: >90 mL/min (ref >90)
GFR calc non Af Amer: 88 mL/min — ABNORMAL LOW (ref >90)
Glucose, Bld: 110 mg/dL — ABNORMAL HIGH (ref 70–99)
Potassium: 4.1 mEq/L (ref 3.5–5.1)
Sodium: 131 mEq/L — ABNORMAL LOW (ref 135–145)

## 2011-10-15 MED ORDER — DOCUSATE SODIUM 100 MG PO CAPS
200.0000 mg | ORAL_CAPSULE | Freq: Every day | ORAL | Status: DC
  Administered 2011-10-16 – 2011-10-18 (×3): 200 mg via ORAL
  Filled 2011-10-15 (×3): qty 2

## 2011-10-15 MED ORDER — ASPIRIN EC 325 MG PO TBEC
325.0000 mg | DELAYED_RELEASE_TABLET | Freq: Every day | ORAL | Status: DC
  Administered 2011-10-16 – 2011-10-18 (×3): 325 mg via ORAL
  Filled 2011-10-15 (×4): qty 1

## 2011-10-15 MED ORDER — BISACODYL 10 MG RE SUPP: 10.0000 mg | Freq: Every day | RECTAL | Status: DC | PRN

## 2011-10-15 MED ORDER — PANTOPRAZOLE SODIUM 40 MG PO TBEC
40.0000 mg | DELAYED_RELEASE_TABLET | Freq: Every day | ORAL | Status: DC
  Administered 2011-10-16 – 2011-10-18 (×3): 40 mg via ORAL
  Filled 2011-10-15 (×2): qty 1

## 2011-10-15 MED ORDER — INSULIN ASPART 100 UNIT/ML ~~LOC~~ SOLN
0.0000 [IU] | Freq: Three times a day (TID) | SUBCUTANEOUS | Status: DC
  Administered 2011-10-15 (×2): 2 [IU] via SUBCUTANEOUS

## 2011-10-15 MED ORDER — MOVING RIGHT ALONG BOOK
Freq: Once | Status: AC
  Administered 2011-10-15: 13:00:00
  Filled 2011-10-15: qty 1

## 2011-10-15 MED ORDER — ONDANSETRON HCL 4 MG PO TABS: 4.0000 mg | ORAL_TABLET | Freq: Four times a day (QID) | ORAL | Status: DC | PRN

## 2011-10-15 MED ORDER — METOPROLOL TARTRATE 12.5 MG HALF TABLET
12.5000 mg | ORAL_TABLET | Freq: Two times a day (BID) | ORAL | Status: DC
  Administered 2011-10-15 – 2011-10-16 (×4): 12.5 mg via ORAL
  Filled 2011-10-15 (×6): qty 1

## 2011-10-15 MED ORDER — BISACODYL 5 MG PO TBEC: 10.0000 mg | DELAYED_RELEASE_TABLET | Freq: Every day | ORAL | Status: DC | PRN

## 2011-10-15 MED ORDER — OXYCODONE HCL 5 MG PO TABS
5.0000 mg | ORAL_TABLET | ORAL | Status: DC | PRN
  Administered 2011-10-16 – 2011-10-17 (×3): 10 mg via ORAL
  Filled 2011-10-15 (×3): qty 2

## 2011-10-15 MED ORDER — SODIUM CHLORIDE 0.9 % IV SOLN: 250.0000 mL | INTRAVENOUS | Status: DC | PRN

## 2011-10-15 MED ORDER — SODIUM CHLORIDE 0.9 % IJ SOLN: 3.0000 mL | INTRAMUSCULAR | Status: DC | PRN

## 2011-10-15 MED ORDER — ONDANSETRON HCL 4 MG/2ML IJ SOLN: 4.0000 mg | Freq: Four times a day (QID) | INTRAMUSCULAR | Status: DC | PRN

## 2011-10-15 MED ORDER — TRAMADOL HCL 50 MG PO TABS
50.0000 mg | ORAL_TABLET | ORAL | Status: DC | PRN
  Administered 2011-10-15 – 2011-10-17 (×7): 100 mg via ORAL
  Administered 2011-10-17 (×2): 50 mg via ORAL
  Administered 2011-10-18: 100 mg via ORAL
  Administered 2011-10-18: 50 mg via ORAL
  Filled 2011-10-15: qty 1
  Filled 2011-10-15 (×6): qty 2
  Filled 2011-10-15: qty 1
  Filled 2011-10-15 (×2): qty 2
  Filled 2011-10-15: qty 1

## 2011-10-15 MED ORDER — SODIUM CHLORIDE 0.9 % IJ SOLN
3.0000 mL | Freq: Two times a day (BID) | INTRAMUSCULAR | Status: DC
  Administered 2011-10-15 – 2011-10-17 (×6): 3 mL via INTRAVENOUS

## 2011-10-15 MED FILL — Lidocaine HCl IV Inj 20 MG/ML: INTRAVENOUS | Qty: 5 | Status: AC

## 2011-10-15 MED FILL — Sodium Chloride Irrigation Soln 0.9%: Qty: 3000 | Status: AC

## 2011-10-15 MED FILL — Sodium Bicarbonate IV Soln 8.4%: INTRAVENOUS | Qty: 50 | Status: AC

## 2011-10-15 MED FILL — Mannitol IV Soln 20%: INTRAVENOUS | Qty: 500 | Status: AC

## 2011-10-15 MED FILL — Sodium Chloride IV Soln 0.9%: INTRAVENOUS | Qty: 1000 | Status: AC

## 2011-10-15 MED FILL — Electrolyte-R (PH 7.4) Solution: INTRAVENOUS | Qty: 3000 | Status: AC

## 2011-10-15 MED FILL — Heparin Sodium (Porcine) Inj 1000 Unit/ML: INTRAMUSCULAR | Qty: 10 | Status: AC

## 2011-10-15 MED FILL — Heparin Sodium (Porcine) Inj 1000 Unit/ML: INTRAMUSCULAR | Qty: 30 | Status: AC

## 2011-10-15 NOTE — Progress Notes (Addendum)
Patient ID: Nicholas Walls, male   DOB: 12/08/1935, 76 y.o.   MRN: [id number] TCTS DAILY PROGRESS NOTE                   [address]            [name] [address]          [phone]      2 Days Post-Op Procedure(s) (LRB): AORTIC VALVE REPLACEMENT (AVR) (N/A)  Total Length of Stay:  LOS: 2 days   Subjective: Stable , getting out of bed, walked 300 feet this am  Objective: Vital signs in last 24 hours: Temp:  [97.9 F (36.6 C)-98.8 F (37.1 C)] 97.9 F (36.6 C) (02/13 1301) Pulse Rate:  [91-132] 104  (02/13 1301) Cardiac Rhythm:  [-] Normal sinus rhythm (02/13 1331) Resp:  [9-22] 18  (02/13 1301) BP: (82-132)/(45-70) 118/54 mmHg (02/13 1301) SpO2:  [90 %-100 %] 100 % (02/13 1301) Weight:  [229 lb 15 oz (104.3 kg)] 229 lb 15 oz (104.3 kg) (02/13 0400)  Filed Weights   10/12/11 1500 10/14/11 0400 10/15/11 0400  Weight: 222 lb 10.6 oz (101 kg) 229 lb 8 oz (104.1 kg) 229 lb 15 oz (104.3 kg)    Weight change: 7.1 oz (0.2 kg)       Intake/Output from previous day: 02/12 0701 - 02/13 0700 In: 870 [P.O.:300; I.V.:470; IV Piggyback:100] Out: 1250 [Urine:1100; Chest Tube:150]  Intake/Output this shift: Total I/O In: 40 [I.V.:40] Out: 90 [Urine:90]  Current Meds: Scheduled Meds:   . aspirin EC  325 mg Oral Daily  . cefUROXime (ZINACEF)  IV  1.5 g Intravenous Q12H  . docusate sodium  200 mg Oral Daily  . insulin aspart  0-24 Units Subcutaneous TID AC & HS  . metoprolol tartrate  12.5 mg Oral BID  . moving right along book   Does not apply Once  . pantoprazole  40 mg Oral QAC breakfast  . polysaccharide iron  150 mg Oral Q breakfast  . simvastatin  20 mg Oral QHS  . sodium chloride  3 mL Intravenous Q12H    PRN Meds:.sodium chloride, bisacodyl, bisacodyl, fluocinonide cream, ondansetron (ZOFRAN) IV, ondansetron, oxyCODONE, sodium chloride, traMADol, DISCONTD: metoprolol, DISCONTD: midazolam, DISCONTD: morphine, DISCONTD: ondansetron (ZOFRAN) IV,  DISCONTD: oxyCODONE, DISCONTD: sodium chloride  General appearance: alert, cooperative and no distress Neurologic: intact Heart: regular rate and rhythm, S1, S2 normal, no murmur, click, rub or gallop, no m of AI Lungs: clear to auscultation bilaterally Abdomen: soft, non-tender; bowel sounds normal; no masses,  no organomegaly Extremities: extremities normal, atraumatic, no cyanosis or edema Wound: sternum stable  Lab Results: CBC: Basename 10/15/11 0419 10/14/11 1723 10/14/11 1715  WBC 6.3 -- 6.1  HGB 9.3* 9.2* --  HCT 25.8* 27.0* --  PLT 69* -- 78*   BMET:  Basename 10/15/11 0419 10/14/11 1723 10/14/11 0420  NA 131* 135 --  K 4.1 4.1 --  CL 99 100 --  CO2 27 -- 24  GLUCOSE 110* 110* --  BUN 17 19 --  CREATININE 0.76 0.90 --  CALCIUM 8.1* -- 7.8*    PT/INR:  Basename 10/13/11 1330  LABPROT 19.7*  INR 1.64*   Radiology: Dg Chest Portable 1 View In Am  10/15/2011  *RADIOLOGY REPORT*  Clinical Data: Aortic valve replacement.  PORTABLE CHEST - 1 VIEW  Comparison: 10/14/2011  Findings: The Swan-Ganz catheter was removed.  Right jugular central venous catheter is still in place.  Chest drains have  been removed and there is no evidence for a large pneumothorax. However, there is a small area of lucency at the left costophrenic angle and difficult to exclude a tiny amount of pleural air in this area.  There are increased densities at both lung bases suggestive for atelectasis and cannot exclude pleural fluid.  Heart size is grossly stable in size.  IMPRESSION: Increased densities at the lung bases.  Findings are most compatible with atelectasis and possible pleural fluid.  Removal of chest drains.  No evidence for a large pneumothorax but there is a small area of lucency at the left costophrenic angle. A small amount of basilar pleural air cannot be completely excluded.  Original Report Authenticated By: Richarda Overlie, M.D.   Dg Chest Portable 1 View In Am  10/14/2011  *RADIOLOGY REPORT*   Clinical Data: Postop valve replacement  PORTABLE CHEST - 1 VIEW  Comparison: Chest radiograph 10/13/2011  Findings: Interval removal of NG tube.  Swan-Ganz catheter and mediastinal drain remain.  There is mild basilar atelectasis which is not worsened.  No pulmonary edema.  No pneumothorax.  IMPRESSION: Extubation without complication.  Original Report Authenticated By: Genevive Bi, M.D.     Assessment/Plan: S/P Procedure(s) (LRB): AORTIC VALVE REPLACEMENT (AVR) (N/A) Mobilize Diuresis d/c tubes/lines Plan for transfer to step-down: see transfer orders Acute Expected blood lost anemias post op , no transfusions given  [name] MD  Beeper [phone] Office [phone] 10/15/2011 2:59 PM

## 2011-10-15 NOTE — Op Note (Signed)
[name], [name] NO.:  [id number]  MEDICAL RECORD NO.:  [id number]  LOCATION:  2312                         FACILITY:  [facility]  PHYSICIAN:  Sheliah Plane, MD    DATE OF BIRTH:  May 28, 1936  DATE OF PROCEDURE:  10/14/2011 DATE OF DISCHARGE:                              OPERATIVE REPORT   PREOPERATIVE DIAGNOSIS:  [QAMARKER].  POSTOPERATIVE DIAGNOSIS:  Nicholas Walls.  SURGICAL PROCEDURE:  Aortic valve replacement with a #25 pericardial tissue valve, [employer], model 3300TFX, serial [id number].  SURGEON:  Sheliah Plane, MD  FIRST ASSISTANT:  Doree Fudge, PA.  BRIEF HISTORY:  The patient is a 76 year old male who has had a known murmur noted on exam since 2006.  Serial echocardiograms have showed progressive aortic stenosis.  The patient recently began having increasing symptoms of primarily dyspnea on exertion, was seen by Dr. Patty Sermons who arranged cardiac catheterization.  This showed the patient had no evidence of coronary occlusive disease, with his asymptomatic critical aortic stenosis, aortic valve replacement was recommended. By echo aortic valve velocity 405cm/sec, peak 64mm mean gradient 38mm echo AVR 0.8. Risks and options were discussed with the patient, including the risks of long-term Coumadin versus a tissue valve.  With the patient's age, tissue valve was selected as optimal.  The patient signed an informed consent.  DESCRIPTION OF PROCEDURE:  With Swan-Ganz and arterial line monitors in place, the patient underwent general endotracheal anesthesia without incident.  Skin of the chest and leg was prepped with Betadine and draped in usual sterile manner.  A TEE was placed and confirmed diagnosis of severe aortic stenosis, with very trace mitral regurgitation and trace aortic regurgitation.  The skin of the chest and legs were prepped with Betadine and draped in the usual sterile manner. Median sternotomy was performed.   Pericardium was opened.  On examination of the ascending aorta in the anterior and left lateral wall, the aorta was significant hard calcific plaque.  There was an area relatively free of this high on the ascending aorta that allowed aortic cannulation.  The patient was systemically heparinized.  Ascending aorta was cannulated.  The right atrium was cannulated.  A retrograde cardioplegia catheter was placed.  The patient was placed on cardiopulmonary bypass 2.4 L/min/m2.  Aortic root vent cardioplegia needle was introduced into the ascending aorta, more on the right lateral side as to avoid the known hard calcific plaque.  The patient's body temperature was cooled to 32 degrees.  Aortic cross-clamp was applied, 800 mL of cold blood potassium cardioplegia was administered antegrade.  Additional retrograde cardioplegia was also administered, and continued to be intermittently administered during the procedure. Myocardial septal temperature was monitored.  A transverse aortotomy, low into the right side of the aorta was opened in an area to avoid the area of plaque.  This gave good exposure of a trileaflet calcified aortic valve.  The valve was excised taking care to remove all loose calcific debris.  The anulus was debrided.  The anulus was then sized for a 25 pericardial tissue valve.  Seventeen #2 Tycron pledgeted sutures with the pledgets on the ventricular surface were placed circumferentially in the anulus and used to secure the valve  in place. The valve seated well.  The aorta was inspected for any further loose calcific debris.  Aortotomy was then closed with a horizontal mattress 3- 0 Prolene suture over felt strips.  A second layer of over-and-over suture was used also.  Prior to complete closure of aortotomy, the heart was allowed to passively fill and de-air.  With the aortotomy closed, warm retrograde cardioplegia was administered transiently, and the crossclamp was removed with  total cross-clamp time of 96 minutes.  The patient spontaneously converted to a sinus rhythm, 18-gauge needle was introduced into the left ventricular apex to further de-air the heart. Retrograde cardioplegia catheter was removed.  The atrial and ventricular pacing wires were applied.  The patient's body temperature was rewarmed to 37 degrees.  He was then ventilated and weaned from cardiopulmonary bypass without difficulty.  TEE showed good function of the valve without any perivalvular leak.  He was decannulated in usual fashion.  Protamine sulfate was administered with the operative field hemostatic.  Two Blake mediastinal drains were left in place. Pericardium was reapproximated.  Sternum closed with #6 stainless steel wire, fascia closed with interrupted 0 Vicryl, running 3-0 Vicryl in subcutaneous tissue, 4-0 subcuticular stitch in skin edges.  Dry dressings were applied.  Sponge and needle count was reported as correct at the completion of procedure.  The patient tolerated the procedure without obvious complication and was transferred to surgical intensive care unit for further postoperative care.  Total pump time was 138 minutes.  The patient did not require any blood bank blood products during the operative procedure.     Sheliah Plane, MD     EG/MEDQ  D:  10/14/2011  T:  10/14/2011  Job:  [id number]  cc:   Cassell Clement, M.D.

## 2011-10-15 NOTE — Progress Notes (Signed)
CARDIAC REHAB PHASE I   PRE:  Rate/Rhythm: 93 SR  BP:  Supine:   Sitting: 112/50  Standing:    SaO2: 9721/2 L 96 RA  MODE:  Ambulation: 150 ft   POST:  Rate/Rhythem: 119 ST  BP:  Supine:   Sitting: 118/63  Standing:    SaO2: 95 RA 1455-1535 Assisted X 2 and used walker to ambulate. Gait unsteady he takes small short steps. Able to get him 150 ft. Tired by end of walk. RA sat 95% after walk O2 left off. Recommend Physical Therapy consult. Pt back to recliner after walk with call light in reach and family present.  Beatrix Fetters

## 2011-10-15 NOTE — Progress Notes (Signed)
   CARE MANAGEMENT NOTE 10/15/2011  Patient:  Nicholas Walls, Nicholas Walls   Account Number:  [id number]  Date Initiated:  10/14/2011  Documentation initiated by:  [facility]  Subjective/Objective Assessment:   Post op AVR on 10-13-11.  Has spouse.     Action/Plan:   Anticipated DC Date:  10/17/2011   Anticipated DC Plan:  HOME W HOME HEALTH SERVICES      DC Planning Services  CM consult      Choice offered to / List presented to:             Status of service:  In process, will continue to follow Medicare Important Message given?   (If response is "NO", the following Medicare IM given date fields will be blank) Date Medicare IM given:   Date Additional Medicare IM given:    Discharge Disposition:    Per UR Regulation:  Reviewed for med. necessity/level of care/duration of stay  Comments:  10/15/11 Sabrie Moritz,RN,BSN 1542 PER CARDIAC REHAB, PT NOT AMBULATING WELL; NEEDS PT CONSULT.  WILL OBTAIN ORDER. Phone #[phone]   10-14-11 11:15am [name], RNBSN - [phone] UR Completed.

## 2011-10-15 NOTE — Progress Notes (Signed)
RT assessment done at this time. Patient stated he had never had any breathing problems, never needed any treatment.  BBS diminished, SAT 100% on 4 LNC. RT to encourage IS use due to exsisting atelectasis, but other than that and feel patient is doing well. Will Continue to monitor. A

## 2011-10-16 ENCOUNTER — Inpatient Hospital Stay (HOSPITAL_COMMUNITY): Payer: Medicare Other

## 2011-10-16 LAB — BASIC METABOLIC PANEL
BUN: 22 mg/dL (ref 6–23)
CO2: 26 mEq/L (ref 19–32)
Calcium: 8.2 mg/dL — ABNORMAL LOW (ref 8.4–10.5)
Chloride: 95 mEq/L — ABNORMAL LOW (ref 96–112)
Creatinine, Ser: 0.76 mg/dL (ref 0.50–1.35)
GFR calc Af Amer: >90 mL/min (ref >90)
GFR calc non Af Amer: 88 mL/min — ABNORMAL LOW (ref >90)
Glucose, Bld: 106 mg/dL — ABNORMAL HIGH (ref 70–99)
Potassium: 4.2 mEq/L (ref 3.5–5.1)
Sodium: 128 mEq/L — ABNORMAL LOW (ref 135–145)

## 2011-10-16 LAB — CBC
HCT: 23.2 % — ABNORMAL LOW (ref 39.0–52.0)
Hemoglobin: 8.4 g/dL — ABNORMAL LOW (ref 13.0–17.0)
MCH: 33.3 pg (ref 26.0–34.0)
MCHC: 36.2 g/dL — ABNORMAL HIGH (ref 30.0–36.0)
MCV: 92.1 fL (ref 78.0–100.0)
Platelets: 70 10*3/uL — ABNORMAL LOW (ref 150–400)
RBC: 2.52 MIL/uL — ABNORMAL LOW (ref 4.22–5.81)
RDW: 12.7 % (ref 11.5–15.5)
WBC: 5.5 10*3/uL (ref 4.0–10.5)

## 2011-10-16 LAB — GLUCOSE, CAPILLARY: Glucose-Capillary: 98 mg/dL (ref 70–99)

## 2011-10-16 MED ORDER — FUROSEMIDE 10 MG/ML IJ SOLN
20.0000 mg | Freq: Once | INTRAMUSCULAR | Status: AC
  Administered 2011-10-16: 20 mg via INTRAVENOUS
  Filled 2011-10-16: qty 2

## 2011-10-16 MED ORDER — POTASSIUM CHLORIDE CRYS ER 10 MEQ PO TBCR
10.0000 meq | EXTENDED_RELEASE_TABLET | Freq: Once | ORAL | Status: AC
  Administered 2011-10-16: 10 meq via ORAL
  Filled 2011-10-16: qty 1

## 2011-10-16 MED ORDER — FOLIC ACID 1 MG PO TABS
1.0000 mg | ORAL_TABLET | Freq: Every day | ORAL | Status: DC
  Administered 2011-10-16 – 2011-10-18 (×3): 1 mg via ORAL
  Filled 2011-10-16 (×3): qty 1

## 2011-10-16 NOTE — Progress Notes (Signed)
Physical Therapy Evaluation  Past Medical History  Diagnosis Date  . Hyperlipidemia   . Aortic stenosis, severe   . Osteoarthritis      End-stage osteoarthritis--left knee  . History of hiatal hernia   . History of anemia of chronic disease   . Hyponatremia     Mild hyponatremia, allowed to self-correct, asymptomatic  . Sleep apnea      4 yrs ago     no cpap  . Aortic valve defect   . Hypertension     dr Patty Sermons     Past Surgical History  Procedure Date  . Cholecystectomy   . Total knee arthroplasty     bilateral  . Joint replacement     bil knees  . Cervical disc surgery   . Cardiac catheterization   . Aortic valve replacement 10/13/2011    Procedure: AORTIC VALVE REPLACEMENT (AVR);  Surgeon: [name], MD;  [facility];  Service: Open Heart Surgery;  Laterality: N/A;      10/16/11 1600  PT Visit Information  Last PT Received On 10/16/11  Patient Stated Goals  Goal #1 Home  Precautions  Precautions Sternal  Restrictions  Weight Bearing Restrictions No  Home Living  Lives With Spouse  Receives Help From Family  Type of Home House  Home Layout One level  Home Access (hill at [employer])  Home Adaptive Equipment Bedside commode/3-in-1  Prior Function  Level of Independence Independent with basic ADLs;Independent with gait;Independent with transfers;Needs assistance with homemaking  Able to Take Stairs? Yes  Driving Yes  Bed Mobility  Bed Mobility Yes  Rolling Left 4: Min assist  Rolling Left Details (indicate cue type and reason) cues for limited use of UEs secondary to sternal precautions.    Left Sidelying to Sit 4: Min assist  Left Sidelying to Sit Details (indicate cue type and reason) cues for sternal precautions and sequencing.    Sitting - Scoot to Edge of Bed 5: Supervision  Sitting - Scoot to Edge of Bed Details (indicate cue type and reason) cues for scooting on bottom.    Transfers  Transfers Yes  Sit to Stand 4: Min assist;With  upper extremity assist;From bed  Sit to Stand Details (indicate cue type and reason) cues for hands to knees, use of rocking for momentum  Stand to Sit 4: Min assist;With upper extremity assist;To bed  Stand to Sit Details cues for hands to knees and control descent  Ambulation/Gait  Ambulation/Gait Yes  Ambulation/Gait Assistance 4: Min assist  Ambulation/Gait Assistance Details (indicate cue type and reason) cues for upright posture, positioning in RW, increased stride length  Ambulation Distance (Feet) 300 Feet  Assistive device Rolling walker  Gait Pattern Step-through pattern;Decreased stride length;Shuffle;Trunk flexed  Stairs No  Wheelchair Mobility  Wheelchair Mobility No  Posture/Postural Control  Posture/Postural Control No significant limitations  Balance  Balance Assessed No  RLE Assessment  RLE Assessment WFL  LLE Assessment  LLE Assessment WFL  PT - End of Session  Equipment Utilized During Treatment Gait belt  Activity Tolerance Patient tolerated treatment well  Patient left in bed;with call bell in reach;with family/visitor present  Nurse Communication Mobility status for transfers;Mobility status for ambulation  General  Behavior During Session [facility] for tasks performed  Cognition [facility] for tasks performed  PT Assessment  Clinical Impression Statement pt presents s/p AVR.  pt motivated for ambulation, but needs reinforcment of sternal precautions.  Family seems very supportive and will be able to A pt  at D/C  PT Recommendation/Assessment Patient will need skilled PT in the acute care venue  PT Problem List Decreased activity tolerance;Decreased balance;Decreased mobility;Decreased knowledge of use of DME;Decreased knowledge of precautions;Pain  Barriers to Discharge None  PT Therapy Diagnosis  Difficulty walking;Acute pain  PT Plan  PT Frequency Min 3X/week  PT Treatment/Interventions DME instruction;Gait training;Stair training;Functional mobility  training;Therapeutic activities;Therapeutic exercise;Balance training;Patient/family education  PT Recommendation  Recommendations for Other Services OT consult  Follow Up Recommendations Home health PT;Supervision - Intermittent  Equipment Recommended Rolling walker with 5" wheels  Individuals Consulted  Consulted and Agree with Results and Recommendations Patient;Family member/caregiver  Family Member Consulted Wife  Acute Rehab PT Goals  PT Goal Formulation With patient  Time For Goal Achievement 2 weeks  Pt will Roll Supine to Right Side Independently  PT Goal: Rolling Supine to Right Side - Progress Goal set today  Pt will Roll Supine to Left Side Independently  PT Goal: Rolling Supine to Left Side - Progress Goal set today  Pt will go Supine/Side to Sit Independently  PT Goal: Supine/Side to Sit - Progress Goal set today  Pt will go Sit to Supine/Side Independently  PT Goal: Sit to Supine/Side - Progress Goal set today  Pt will go Sit to Stand with supervision  PT Goal: Sit to Stand - Progress Goal set today  Pt will Ambulate >150 feet;with supervision;with rolling walker  PT Goal: Ambulate - Progress Goal set today  Additional Goals  Additional Goal #1 pt will maintain sternal precautions.    PT Goal: Additional Goal #1 - Progress Goal set today     Mack Hook, PT [phone]

## 2011-10-16 NOTE — Progress Notes (Addendum)
3 Days Post-Op Procedure(s) (LRB): AORTIC VALVE REPLACEMENT (AVR) (N/A)  Subjective: Patient about to eat breakfast. No real complaints.  Objective: Vital signs in last 24 hours: Patient Vitals for the past 24 hrs:  BP Temp Temp src Pulse Resp SpO2 Weight  10/16/11 0700 - - - - - - 228 lb 6.3 oz (103.6 kg)  10/16/11 0457 106/57 mmHg 98.5 F (36.9 C) Oral 100  16  95 % -  10/15/11 2100 138/81 mmHg 98 F (36.7 C) Oral 68  18  95 % -  10/15/11 2042 118/68 mmHg 98.2 F (36.8 C) Oral 104  20  95 % -  10/15/11 1301 118/54 mmHg 97.9 F (36.6 C) Oral 104  18  100 % -  10/15/11 1208 - 98.4 F (36.9 C) Oral - - - -  10/15/11 1200 103/49 mmHg - - 95  16  100 % -  10/15/11 1100 106/55 mmHg - - 91  14  99 % -  10/15/11 1000 83/57 mmHg - - 92  9  97 % -  10/15/11 0900 110/59 mmHg - - 102  15  97 % -  10/15/11 0800 93/54 mmHg - - 96  11  98 % -   Pre op weight  101.2 kg Current Weight  10/16/11 228 lb 6.3 oz (103.6 kg)      Intake/Output from previous day: 02/13 0701 - 02/14 0700 In: 40 [I.V.:40] Out: 390 [Urine:390]   Physical Exam:  Cardiovascular: RRR, no murmurs, gallops, or rubs. Pulmonary: Slightly decreased at the bases bilaterally; no rales, wheezes, or rhonchi. Abdomen: Soft, non tender, bowel sounds present. Extremities: Bilateral lower extremity edema. Wound: Clean and dry.  No erythema or signs of infection.  Lab Results: CBC: Basename 10/16/11 0530 10/15/11 0419  WBC 5.5 6.3  HGB 8.4* 9.3*  HCT 23.2* 25.8*  PLT 70* 69*   BMET:  Basename 10/16/11 0530 10/15/11 0419  NA 128* 131*  K 4.2 4.1  CL 95* 99  CO2 26 27  GLUCOSE 106* 110*  BUN 22 17  CREATININE 0.76 0.76  CALCIUM 8.2* 8.1*    PT/INR:  Basename 10/13/11 1330  LABPROT 19.7*  INR 1.64*   ABG:  INR: Will add last result for INR, ABG once components are confirmed Will add last 4 CBG results once components are confirmed  Assessment/Plan:  1. CV - Brief run of af yesterday.Maintaining SR.  Continue Lopressor 12.5 bid. 2.  Pulmonary - Encourage incentive spirometer.CXR this am shows no pneumothorax, improvement in aeration, bibasilar atelectasis. 3. Volume Overload - Diurese as bp allows. 4.  Acute blood loss anemia - H/H decreased this am to 8.4/23.2. Continue Nu Iron and start  folic acid.No need for a transfusion at this time. 5.HGA1C pre op os 4.9. CBGs have been less than 130.Will stop accu checks and SS. 6.Remove EPW in am. 7.Thrombocytopenia-Platelets remain around 70,000. Not on heparin;is on ecasa 325 daily.   ZIMMERMAN,Nicholas Walls 10/16/2011   steady progress Poss home SAT, lives in [place] I have seen and examined Nicholas Walls and agree with the above assessment  and plan.  [name] MD Beeper [phone] Office [phone] 10/16/2011 11:54 AM

## 2011-10-16 NOTE — Plan of Care (Signed)
Problem: Phase III Progression Outcomes Goal: Other Phase III Outcomes/Goals Outcome: Progressing Ongoing discharge teaching

## 2011-10-16 NOTE — Progress Notes (Signed)
Please see telemetry strip from 10pm on 10-15-2011, patient had burst of atrial flutter. Patient denies any symptoms.

## 2011-10-16 NOTE — Progress Notes (Signed)
   CARE MANAGEMENT NOTE 10/16/2011  Patient:  Nicholas Walls, Nicholas Walls   Account Number:  [id number]  Date Initiated:  10/14/2011  Documentation initiated by:  [facility]  Subjective/Objective Assessment:   Post op AVR on 10-13-11.  Has spouse.     Action/Plan:   PTA, PT FAIRLY INDEPENDENT, LIVES WITH SPOUSE.  PT STATES HAS BACK AND KNEE PROBLEMS, AND HAS PROBLEMS WITH UNSTEADY GAIT.  PHYSICAL THERAPY TO CONSULT.   Anticipated DC Date:  10/17/2011   Anticipated DC Plan:  HOME W HOME HEALTH SERVICES      DC Planning Services  CM consult      [facility] Choice  HOME HEALTH   Choice offered to / List presented to:  C-1 Patient        HH arranged  HH-1 RN  HH-2 PT      [facility] agency  [facility]   Status of service:  In process, will continue to follow Medicare Important Message given?   (If response is "NO", the following Medicare IM given date fields will be blank) Date Medicare IM given:   Date Additional Medicare IM given:    Discharge Disposition:  HOME W HOME HEALTH SERVICES  Per UR Regulation:  Reviewed for med. necessity/level of care/duration of stay  Comments:  10/16/11 Nicholas Don,RN,BSN 1400 [facility] STATES UNABLE TO PICK PT UP DUE TO STAFF SHORTAGE. SPOKE WITH PT, HE WOULD LIKE TO USE [facility] AS SECOND CHOICE.  SPOKE WITH [facility], THEY WILL PICK PT UP; FAXED REFERRAL TO [phone]; NO TLC.  START OF CARE 24-48H POST DC DAE.  10/16/11 Nicholas Starner,RN,BSN 1410 MET WITH PT AND WIFE TO DISCUSS DC PLANS.  WIFE TO PROVIDE 24HR CARE AT DISCHARGE.  PT/WIFE INTERESTED IN HOME HEALTH FOLLOW UP AT HOME; STATE THEY HAVE USED [facility] IN THE PAST AND WISH TO USE THIS AGENCY AGAIN, IF POSSIBLE.  WILL ARRANGE HOME HEALTH FOLLOW UP WITH HH AGENCY OF CHOICE. AWAIT P.T. RECOMMENDATIONS.  START OF CARE 24-48H POST DC DATE.  REFERRAL TO [facility] PER PROTOCOL. Phone #[phone]   10/15/11 Nicholas Newborn,RN,BSN 1542 PER CARDIAC REHAB, PT NOT AMBULATING WELL; NEEDS  PT CONSULT.  WILL OBTAIN ORDER.  10-14-11 11:15am Nicholas Walls, RNBSN [phone] UR Completed.

## 2011-10-16 NOTE — Progress Notes (Signed)
   CARE MANAGEMENT NOTE 10/16/2011  Patient:  Nicholas Walls, Nicholas Walls   Account Number:  [id number]  Date Initiated:  10/14/2011  Documentation initiated by:  [facility]  Subjective/Objective Assessment:   Post op AVR on 10-13-11.  Has spouse.     Action/Plan:   PTA, PT FAIRLY INDEPENDENT, LIVES WITH SPOUSE.  PT STATES HAS BACK AND KNEE PROBLEMS, AND HAS PROBLEMS WITH UNSTEADY GAIT.  PHYSICAL THERAPY TO CONSULT.   Anticipated DC Date:  10/17/2011   Anticipated DC Plan:  HOME W HOME HEALTH SERVICES      DC Planning Services  CM consult      [facility] Choice  HOME HEALTH   Choice offered to / List presented to:  C-1 Patient        HH arranged  HH-1 RN  HH-2 PT      [facility] agency  [facility]   Status of service:  In process, will continue to follow Medicare Important Message given?   (If response is "NO", the following Medicare IM given date fields will be blank) Date Medicare IM given:   Date Additional Medicare IM given:    Discharge Disposition:  HOME W HOME HEALTH SERVICES  Per UR Regulation:  Reviewed for med. necessity/level of care/duration of stay  Comments:  10/16/11 Nicholas Louro,RN,BSN 1410 MET WITH PT AND WIFE TO DISCUSS DC PLANS.  WIFE TO PROVIDE 24HR CARE AT DISCHARGE.  PT/WIFE INTERESTED IN HOME HEALTH FOLLOW UP AT HOME; STATE THEY HAVE USED [facility] IN THE PAST AND WISH TO USE THIS AGENCY AGAIN, IF POSSIBLE.  WILL ARRANGE HOME HEALTH FOLLOW UP WITH HH AGENCY OF CHOICE. AWAIT P.T. RECOMMENDATIONS.  START OF CARE 24-48H POST DC DATE.  REFERRAL TO [facility] PER PROTOCOL. Phone #[phone]   10/15/11 Nicholas Barbero,RN,BSN 1542 PER CARDIAC REHAB, PT NOT AMBULATING WELL; NEEDS PT CONSULT.  WILL OBTAIN ORDER. Phone #[phone]  10-14-11 11:15am Nicholas Walls, RNBSN - [phone] UR Completed.

## 2011-10-16 NOTE — Progress Notes (Signed)
CARDIAC REHAB PHASE I   PRE:  Rate/Rhythm: 88 SR  BP:  Supine:   Sitting: 105/55  Standing:    SaO2: 95 RA  MODE:  Ambulation: 250 ft   POST:  Rate/Rhythem: 124 ST  BP:  Supine:   Sitting: 117/57  Standing:    SaO2: 95 RA 0950-1015 Assisted X 2 and used walker to ambulate. Gait continues and wobbly. He is taking larger steps today. VS stable Able to get him 250 ft. Back to recliner after walk with call light in reach.  Beatrix Fetters

## 2011-10-17 MED ORDER — LACTULOSE 10 GM/15ML PO SOLN
20.0000 g | Freq: Once | ORAL | Status: AC
  Administered 2011-10-17: 20 g via ORAL
  Filled 2011-10-17: qty 30

## 2011-10-17 MED ORDER — POTASSIUM CHLORIDE CRYS ER 20 MEQ PO TBCR: 20.0000 meq | EXTENDED_RELEASE_TABLET | Freq: Every day | ORAL | Status: DC

## 2011-10-17 MED ORDER — OXYCODONE HCL 5 MG PO TABS: 5.0000 mg | ORAL_TABLET | ORAL | Status: AC | PRN

## 2011-10-17 MED ORDER — METOPROLOL TARTRATE 25 MG PO TABS: 25.0000 mg | ORAL_TABLET | Freq: Two times a day (BID) | ORAL | Status: DC

## 2011-10-17 MED ORDER — FOLIC ACID 1 MG PO TABS: 1.0000 mg | ORAL_TABLET | Freq: Every day | ORAL | Status: DC

## 2011-10-17 MED ORDER — FUROSEMIDE 40 MG PO TABS: 40.0000 mg | ORAL_TABLET | Freq: Every day | ORAL | Status: DC

## 2011-10-17 MED ORDER — OXYCODONE HCL 5 MG PO TABS: 5.0000 mg | ORAL_TABLET | ORAL | Status: DC | PRN

## 2011-10-17 MED ORDER — FUROSEMIDE 40 MG PO TABS
40.0000 mg | ORAL_TABLET | Freq: Every day | ORAL | Status: DC
  Administered 2011-10-17 – 2011-10-18 (×2): 40 mg via ORAL
  Filled 2011-10-17 (×2): qty 1

## 2011-10-17 MED ORDER — POTASSIUM CHLORIDE CRYS ER 20 MEQ PO TBCR
20.0000 meq | EXTENDED_RELEASE_TABLET | Freq: Every day | ORAL | Status: DC
  Administered 2011-10-17 – 2011-10-18 (×2): 20 meq via ORAL
  Filled 2011-10-17 (×2): qty 1

## 2011-10-17 MED ORDER — METOPROLOL TARTRATE 25 MG PO TABS
25.0000 mg | ORAL_TABLET | Freq: Two times a day (BID) | ORAL | Status: DC
  Administered 2011-10-17 – 2011-10-18 (×3): 25 mg via ORAL
  Filled 2011-10-17 (×4): qty 1

## 2011-10-17 MED ORDER — ASPIRIN 325 MG PO TBEC: 325.0000 mg | DELAYED_RELEASE_TABLET | Freq: Every day | ORAL | Status: DC

## 2011-10-17 NOTE — Progress Notes (Signed)
UR Completed.  [name] Jane [phone] 10/17/2011  

## 2011-10-17 NOTE — Progress Notes (Addendum)
PT Treatment Note  10/17/11 1243  PT Visit Information  Last PT Received On 10/17/11  Precautions  Precautions Sternal  Restrictions  Weight Bearing Restrictions No  Bed Mobility  Bed Mobility Yes  Rolling Left 4: Min assist  Rolling Left Details (indicate cue type and reason) cues for limited use of UEs  Left Sidelying to Sit 4: Min assist  Left Sidelying to Sit Details (indicate cue type and reason) cues for limited UE use and sequencing  Transfers  Sit to Stand 4: Min assist  Sit to Stand Details (indicate cue type and reason) cues for hands on knees and rocking to get up  Stand to Sit 4: Min assist  Stand to Sit Details cues to sit slowly and limit use of UEs  Ambulation/Gait  Ambulation/Gait Yes  Ambulation/Gait Assistance 4: Min assist  Ambulation Distance (Feet) 350 Feet  Assistive device Rolling walker  Gait Pattern Step-through pattern  PT - End of Session  Equipment Utilized During Treatment Gait belt  Activity Tolerance Patient tolerated treatment well  Patient left in chair;with call bell in reach;with family/visitor present  Nurse Communication Mobility status for ambulation  General  Behavior During Session [facility] for tasks performed  Cognition [facility] for tasks performed  PT - Assessment/Plan  Comments on Treatment Session Pt did well today.  He was able to walk with improved control and even abel to talk for half of  the walk while maintaining 1/4 dyspnea on room air.  Pts O2 sats after walk 95%.  Discussed preceived exertion scale with wife and pt and how to increase walking program safely at home.  Pt noted to have right foot with weight on outside of foot - almost wanting to roll to side.  I pointed this out to pt and wife and talked about shoe selection that would help this.  PT Plan Discharge plan remains appropriate;Frequency remains appropriate  PT Frequency Min 3X/week  Follow Up Recommendations Home health PT;Supervision - Intermittent  Equipment Recommended  Rolling walker with 5" wheels  Acute Rehab PT Goals  PT Goal: Sit to Supine/Side - Progress Progressing toward goal  PT Goal: Sit to Stand - Progress Progressing toward goal  PT Goal: Ambulate - Progress Progressing toward goal  Additional Goals  PT Goal: Additional Goal #1 - Progress Progressing toward goal   10/17/2011   Ranae Palms, PT

## 2011-10-17 NOTE — Progress Notes (Signed)
Ed completed with pt and wife. Requests referral be sent to [facility]. Ethelda Chick CES, ACSM

## 2011-10-17 NOTE — Discharge Instructions (Signed)
Aortic Valve Replacement Care After Read the instructions outlined below and refer to this sheet for the next few weeks. These discharge instructions provide you with general information on caring for yourself after you leave the hospital. Your surgeon may also give you specific instructions. While your treatment has been planned according to the most current medical practices available, unavoidable complications occasionally occur. If you have any problems or questions after discharge, please call your surgeon. AFTER THE PROCEDURE  Full recovery from heart valve surgery can take several months.   Blood thinning (anticoagulation) treatment with warfarin is often prescribed for 6 weeks to 3 months after surgery for those with biological valves. It is prescribed for life for those with mechanical valves.   Recovery includes healing of the surgical incision. There is a gradual building of stamina and exercise abilities. An exercise program under the direction of a physical therapist may be recommended.   Once you have an artificial valve, your heart function and your life will return to normal. You usually feel better after surgery. Shortness of breath and fatigue should lessen. If your heart was already severely damaged before your surgery, you may continue to have problems.   You can usually resume most of your normal activities. You will have to continue to monitor your condition. You need to watch out for blood clots and infections.   Artificial valves need to be replaced after a period of time. It is important that you see your caregiver regularly.   Some individuals with an aortic valve replacement need to take antibiotics before having dental work or other surgical procedures. This is called prophylactic antibiotic treatment. These drugs help to prevent infective endocarditis. Antibiotics are only recommended for individuals with the highest risk for developing infective endocarditis. Let your  dentist and your caregiver know if you have a history of any of the following so that the necessary precautions can be taken:   A VSD.   A repaired VSD.   Endocarditis in the past.   An artificial (prosthetic) heart valve.  HOME CARE INSTRUCTIONS   Use all medications as prescribed.   Take your temperature every morning for the first week after surgery. Record these.   Weigh yourself every morning for at least the first week after surgery and record.   Do not lift more than 10 pounds (4.5 kg) until your breastbone (sternum) has healed. Avoid all activities which would place strain on your incision.   You may shower as soon as directed by your caregiver after surgery. Pat incisions dry. Do not rub incisions with washcloth or towel.   Avoid driving for 4 to 6 weeks following surgery or as instructed.   Use your elastic stockings during the day. You should wear the stockings for at least 2 weeks after discharge or longer if your ankles are swollen. The stockings help blood flow and help reduce swelling in the legs. It is easiest to put the stockings on before you get out of bed in the morning. They should fit snugly.  Pain Control  If a prescription was given for a pain reliever, please follow your doctor's directions.   If the pain is not relieved by your medicine, becomes worse, or you have difficulty breathing, call your surgeon.  Activity  Take frequent rest periods throughout the day.   Wait one week before returning to strenuous activities such as heavy lifting (more than 10 pounds), pushing or pulling.   Talk with your doctor about when you may   return to work and your exercise routine.   Do not drive while taking prescription pain medication.  Nutrition  You may resume your normal diet.   Drink plenty of fluids (6-8 glasses a day).   Eat a well-balanced diet.   Call your caregiver for persistent nausea or vomiting.  Elimination Your normal bowel function should  return. If constipation should occur, you may:  Take a mild laxative.   Add fruit and bran to your diet.   Drink more fluids.   Call your doctor if constipation is not relieved.  SEEK IMMEDIATE MEDICAL CARE IF:   You develop chest pain which is not coming from your surgical cut (incision).   You develop shortness of breath or have difficulty breathing.   You develop a temperature over 101 F (38.3 C).   You have a sudden weight gain. Let your caregiver know what the weight gain is.   You develop a rash.   You develop any reaction or side effects to medications given.   You have increased bleeding from wounds.   You see redness, swelling, or have increasing pain in wounds.   You have pus coming from your wound.   You develop lightheadedness or feel faint.  Document Released: 03/06/2005 Document Revised: 04/30/2011 Document Reviewed: 05/28/2005 [employer] Patient Information 2012 [employer]. 

## 2011-10-17 NOTE — Progress Notes (Addendum)
4 Days Post-Op Procedure(s) (LRB): AORTIC VALVE REPLACEMENT (AVR) (N/A)  Subjective: Patient with incisional pain in the am but less so in the evening. No bm yet. Wife concerned oxycodone is too strong for him-occasional confusion and unsteady.  Objective: Vital signs in last 24 hours: Patient Vitals for the past 24 hrs:  BP Temp Temp src Pulse Resp SpO2 Weight  10/17/11 0532 - - - - - - 228 lb 8 oz (103.647 kg)  10/17/11 0518 110/62 mmHg 98 F (36.7 C) Oral 95  18  97 % -  10/16/11 2020 119/67 mmHg 98.4 F (36.9 C) Oral 91  18  94 % -  10/16/11 1421 94/62 mmHg - - - - - -  10/16/11 1418 85/61 mmHg 97.9 F (36.6 C) Oral 88  16  93 % -  10/16/11 1055 113/62 mmHg 97.9 F (36.6 C) Oral 96  16  - -  10/16/11 0825 102/62 mmHg 98 F (36.7 C) Oral 94  16  - -   Pre op weight  101.2 kg Current Weight  10/17/11 228 lb 8 oz (103.647 kg)      Intake/Output from previous day: 02/14 0701 - 02/15 0700 In: 720 [P.O.:720] Out: 1800 [Urine:1800]   Physical Exam:  Cardiovascular: RRR, no murmurs, gallops, or rubs. Pulmonary: Slightly decreased at the bases bilaterally; no rales, wheezes, or rhonchi. Abdomen: Soft, non tender, bowel sounds present. Extremities: Bilateral lower extremity edema. Wound: Clean and dry.  No erythema or signs of infection.  Lab Results: CBC:  Basename 10/16/11 0530 10/15/11 0419  WBC 5.5 6.3  HGB 8.4* 9.3*  HCT 23.2* 25.8*  PLT 70* 69*   BMET:   Basename 10/16/11 0530 10/15/11 0419  NA 128* 131*  K 4.2 4.1  CL 95* 99  CO2 26 27  GLUCOSE 106* 110*  BUN 22 17  CREATININE 0.76 0.76  CALCIUM 8.2* 8.1*    PT/INR: No results found for this basename: LABPROT,INR in the last 72 hours ABG:  INR: Will add last result for INR, ABG once components are confirmed Will add last 4 CBG results once components are confirmed  Assessment/Plan:  1. CV - Brief run of af yesterday.Maintaining SR. Will increase Lopressor to 25 bid. 2.  Pulmonary - Encourage  incentive spirometer. 3. Volume Overload - Diurese. 4.  Acute blood loss anemia - Last H/H  8.4/23.2. Continue Nu Iron and start  folic acid.No need for a transfusion at this time. 7.Thrombocytopenia-Platelets remain around 70,000. Not on heparin;is on ecasa 325 daily.Check in am. 8.LOC constipation. 9.May transfer to 2000. 10.Ultram for pain and less use of Oxycodone. 11.Possbile discharge in 1-2 days.    ZIMMERMAN,DONIELLE MPA-C 10/17/2011     I have seen and examined the patient and agree with the assessment and plan as outlined.  Looks good. Tentatively for d/c home in am.   Purcell Nails 10/17/2011 4:42 PM

## 2011-10-17 NOTE — Progress Notes (Signed)
CARDIAC REHAB PHASE I   PRE:  Rate/Rhythm: 101ST  BP:  Supine:   Sitting: 140/80  Standing:    SaO2: 94%RA  MODE:  Ambulation: 550 ft   POST:  Rate/Rhythem: 101ST PVCs  BP:  Supine:   Sitting: 146/70  Standing:    SaO2: would not register 1245-1315 Pt walked 550 ft on RA with rolling walker and asst x 2. Pt's gait much improved. Can be asst x 1. Discussed Phase 2 and permission given to refer to [facility] Phase 2. To recliner with call bell after walk. Would recommend rolling walker for home use.  Duanne Limerick

## 2011-10-17 NOTE — Progress Notes (Signed)
   CARE MANAGEMENT NOTE 10/17/2011  Patient:  Nicholas Walls, Nicholas Walls   Account Number:  [id number]  Date Initiated:  10/14/2011  Documentation initiated by:  [facility]  Subjective/Objective Assessment:   Post op AVR on 10-13-11.  Has spouse.     Action/Plan:   PTA, PT FAIRLY INDEPENDENT, LIVES WITH SPOUSE.  PT STATES HAS BACK AND KNEE PROBLEMS, AND HAS PROBLEMS WITH UNSTEADY GAIT.  PHYSICAL THERAPY TO CONSULT.   Anticipated DC Date:  10/17/2011   Anticipated DC Plan:  HOME W HOME HEALTH SERVICES      DC Planning Services  CM consult      [facility] Choice  HOME HEALTH   Choice offered to / List presented to:  C-1 Patient   DME arranged  Levan Hurst      DME agency  [facility]     [facility] arranged  HH-1 RN  HH-2 PT      [facility] agency  [facility]   Status of service:  In process, will continue to follow Medicare Important Message given?   (If response is "NO", the following Medicare IM given date fields will be blank) Date Medicare IM given:   Date Additional Medicare IM given:    Discharge Disposition:  HOME W HOME HEALTH SERVICES  Per UR Regulation:  Reviewed for med. necessity/level of care/duration of stay  Comments:  10/17/11 Estefano Victory,RN,BSN NEEDS RW AFTER ALL; REFERRAL TO [facility] FOR DME NEEDS. Phone #[phone]  10/16/11 Alexas Basulto,RN,BSN [facility] STATES UNABLE TO PICK PT UP DUE TO STAFF SHORTAGE. SPOKE WITH PT, HE WOULD LIKE TO USE [facility] AS SECOND CHOICE.  SPOKE WITH [facility] AT [facility], THEY WILL PICK PT UP; FAXED REFERRAL TO [phone]; NO TLC.  START OF CARE 24-48H POST DC DATE. Phone #[phone]   10/16/11 Charise Leinbach,RN,BSN 1410 MET WITH PT AND WIFE TO DISCUSS DC PLANS.  WIFE TO PROVIDE 24HR CARE AT DISCHARGE.  PT/WIFE INTERESTED IN HOME HEALTH FOLLOW UP AT HOME; STATE THEY HAVE USED [facility] IN THE PAST AND WISH TO USE THIS AGENCY AGAIN, IF POSSIBLE.  WILL ARRANGE HOME HEALTH FOLLOW UP WITH HH AGENCY OF CHOICE.  AWAIT P.T. RECOMMENDATIONS.  START OF CARE 24-48H POST DC DATE.  REFERRAL TO [facility] PER PROTOCOL. Phone #[phone]   10/15/11 Amyah Clawson,RN,BSN 1542 PER CARDIAC REHAB, PT NOT AMBULATING WELL; NEEDS PT CONSULT.  WILL OBTAIN ORDER. Phone #[phone]  10-14-11 11:15am [name], RNBSN - [phone] UR Completed.

## 2011-10-17 NOTE — Discharge Summary (Signed)
[address]            [place] [address]          [phone]      Nicholas Walls 1936/08/11 76 y.o. [id number]  10/13/2011   [name], MD  AS  History of Present Illness:  Patient is a 76 year old male with known aortic stenosis. He first had a heart murmur noticed in 2006. At that time an echocardiogram showed mild to moderate aortic stenosis with peak gradient of 25 and a mean gradient of 15 with a calculated aortic valve area of 1.6 cm. He also had mild aortic insufficiency. Serial echocardiograms have confirmed progressive aortic stenosis.  The patient denies angina syncope, pedal edema, nocturnal dyspnea. He does have orthopnea. He does note fatigue especially while working. He does have exertional dyspnea which seems to be getting worse. His wife notes that she has a diagnosis of COPD. In the past she would become short of breath long before he did while walking. Now he becomes more dyspneic before she does. He does have a history of sleep apnea.  Because of worsening aortic valvular stenosis and increasing symptoms of dyspnea on exertion the patient is referred for consideration of aortic valve replacement . The patient was evaluated in cardiothoracic surgical consultation by Dr. Sheliah Plane who agreed with recommendations to proceed with aortic valve replacement. Current Activity/ Functional Status:  Patient is independent with mobility/ambulation, transfers, ADL's, IADL's.  Past Medical History   Diagnosis  Date   .  Hyperlipidemia    .  Aortic stenosis, severe    .  Osteoarthritis      End-stage osteoarthritis--left knee   .  History of hiatal hernia    .  History of anemia of chronic disease    .  Hyponatremia      Mild hyponatremia, allowed to self-correct, asymptomatic   .  Sleep apnea      4 yrs ago no cpap   .  Aortic valve defect    .  Hypertension      dr Patty Sermons    Past Surgical History   Procedure  Date     .  Cholecystectomy    .  Total knee arthroplasty      bilateral   .  Joint replacement      bil knees   .  Cervical disc surgery    .  Cardiac catheterization     Family History   Problem  Relation  Age of Onset   .  Cancer  Mother  age 66      throat    .  Heart failure  Father    .  Cancer  Father  Age 38      Lung Cancer    .  Hypertension  Sister     History    Social History   .  Marital Status:  Married     Spouse Name:  N/A     Number of Children:  N/A   .  Years of Education:  N/A    Occupational History   .  Not on file.    Social History Main Topics   .  Smoking status:  Former Smoker     Types:  Cigarettes     Quit date:  09/01/1981   .  Smokeless tobacco:  Never Used   .  Alcohol Use:  Yes   .  Drug Use:  No   .      History   Smoking status   .  Former Smoker   .  Types:  Cigarettes   .  Quit date:  09/01/1981   Smokeless tobacco   .  Never Used    History   Alcohol Use   .  1.2 oz/week   .  2 Cans of beer per week     weekly    No Known Allergies  Prescriptions prior to admission   Medication  Sig  Dispense  Refill   .  aspirin 81 MG tablet  Take 81 mg by mouth daily.     .  ferrous fumarate-iron polysaccharide complex (TANDEM) 162-115.2 MG CAPS  Take 1 capsule by mouth daily with breakfast.     .  fish oil-omega-3 fatty acids 1000 MG capsule  Take 3 g by mouth daily.     .  fluocinonide cream (LIDEX) 0.05 %  Apply 1 application topically 2 (two) times daily as needed. Apply to hand rash as needed.     [name]  losartan (COZAAR) 50 MG tablet  Take 50 mg by mouth daily.     .  naproxen sodium (ANAPROX) 220 MG tablet  Take 220 mg by mouth 3 (three) times daily with meals.     .  simvastatin (ZOCOR) 20 MG tablet  Take 20 mg by mouth at bedtime.      Review of Systems:at  time of consultation Cardiac Review of Systems: Y or N  Chest Pain [ n ] Resting SOB [ n ] Exertional SOB [ y ] Orthopnea [ y]  Pedal Edema [ n ] Palpitations [ n ] Syncope  [ n ] Presyncope [n ]  General Review of Systems: [Y] = yes [ ] =no  Constitional: recent weight change [ n ]; anorexia [ n]; fatigue [place] ]; nausea [ n ]; night sweats [n ]; fever [ n ]; or chills [ n ];  Dental: poor dentition[ n]; Last Dentist visit: no teeth  Eye : blurred vision [ ] ; diplopia [ ] ; vision changes [ ] ; Amaurosis fugax[ ] ;  Resp: cough [ n ]; wheezing[n ]; hemoptysis[ ] ; shortness of breath[ ] ; paroxysmal nocturnal dyspnea[ ] ; dyspnea on exertion[ ] ; or orthopnea[ ] ;  GI: gallstones[ ] , vomiting[ ] ; dysphagia[ ] ; melena[ ] ; hematochezia [ ] ; heartburn[ ] ; Hx of Colonoscopy[ y ];  GU: kidney stones [ ] ; hematuria[n ]; dysuria [ n ]; nocturia[ ] ; history of obstruction [ ] ;  Skin: rash, swelling[ ] ;, hair loss[n ]; peripheral edema[ ] ; or itching[ ] ;  Musculosketetal: myalgias[ y ]; joint swelling[y ]; joint erythema[y ];  joint pain[ y ]; back pain[ y ];  Heme/Lymph: bruising[ n ]; bleeding[ n ]; anemia[y ];  Neuro: TIA[ ] ; headaches[ ] ; stroke[ ] ; vertigo[ y ]; seizures[ n ]; paresthesias[n ]; difficulty walking[ n ];  Psych:depression[ ] ; anxiety[ ] ;  Endocrine: diabetes[ ] ; thyroid dysfunction[ ] ;  Immunizations: Flu [ y ]; Pneumococcal[ y ];  Other:  Physical Exam: At time of consultation  BP 123/81  Pulse 83  Temp(Src) 98 F (36.7 C) (Oral)  Resp 18  Ht 6' 2.02" (1.88 m)  Wt 222 lb 10.6 oz (101 kg)  BMI 28.58 kg/m2  SpO2 98%  General appearance: alert, cooperative, appears stated age and no distress  Neurologic: intact  Heart: regular rate and rhythm and systolic murmur: holosystolic 4/6, blowing throughout the precordium  Lungs: clear to auscultation bilaterally  Abdomen: soft, non-tender; bowel sounds normal; no masses, no organomegaly  Extremities: extremities normal, atraumatic, no cyanosis or edema, Homans sign is negative, no sign of DVT and no edema, redness or tenderness in the calves or thighs  Diagnostic Studies & Laboratory data:  Recent Radiology  Findings:  Dg Chest 2 View  09/04/2011 *RADIOLOGY REPORT* Clinical Data: Chest pain, preop for cardiac catheterization CHEST - 2 VIEW Comparison: Chest x-ray of 08/20/2010 Findings: The lungs are clear but hyperaerated consistent with COPD. There is peribronchial thickening indicative of bronchitis. Mediastinal contours are stable. The heart is mildly enlarged and stable. No acute bony abnormality is seen. IMPRESSION: Hyperaeration consistent with COPD. No active lung disease. Original Report Authenticated By: Juline Patch, M.D.  US Abdomen Complete: done to evauate  ECHO 05/2011  Study Conclusions  - Left ventricle: The cavity size was normal. Wall thickness was increased in a pattern of mild LVH. Systolic function was normal. The estimated ejection fraction was in the range of 55% to 65%. Wall motion was normal; there were no regional wall motion abnormalities. Features are consistent with a pseudonormal left ventricular filling pattern, with concomitant abnormal relaxation and increased filling pressure (grade 2 diastolic dysfunction). - Aortic valve: There was severe stenosis. Mild regurgitation. - Mitral valve: Calcified annulus. Mildly thickened leaflets . Mild regurgitation. - Left atrium: The atrium was mildly dilated. - Right ventricle: The cavity size was mildly dilated. - Atrial septum: No defect or patent foramen ovale was identified. Transthoracic echocardiography. M-mode, complete 2D, spectral Doppler, and color Doppler. Height: Height: 188cm. Height: 74in. Weight: Weight: 95.3kg. Weight: 209.6lb. Body mass index: BMI: 27kg/m^2. Body surface area: BSA: 2.28m^2. Blood pressure: 130/70. Patient status: Outpatient. Location: [facility]   aortic vale velocity 405 cm/sec Peak grad by echo 64 mean 38  AVA o.8  Recent Lab Findings:  Lab Results   Component  Value  Date    WBC  4.7  10/09/2011    HGB  14.3  10/09/2011    HCT  40.0  10/09/2011    PLT  155  10/09/2011     GLUCOSE  98  10/09/2011    ALT  20  10/09/2011    AST  20  10/09/2011    NA  138  10/09/2011    K  4.4  10/09/2011    CL  103  10/09/2011    CREATININE  0.82  10/09/2011    BUN  15  10/09/2011    CO2  24  10/09/2011    INR  1.04  10/09/2011    HGBA1C  4.9  10/09/2011    Cardiac Cath:  Procedural Findings:  Hemodynamics  RA 5/3 mean 2 mmHg  RV 31/6 mmHg  PA 24/10 mean 17 mmHg  PCWP 14/14 mean 10 mmHg  LV 179/15 mmHg  AO 142/72 mean 102 mmHg  Oxygen saturations:  PA 70%  AO 90%  Cardiac Output (Fick) 7.8 L/min  Cardiac Index (Fick) 3.4 L/min/m2  Aortic valve mean gradient 40 mm Hg  Aortic valve area 1.1 cm2, index .5  Coronary angiography:  Coronary dominance: right  Left mainstem: Normal  Left anterior descending (LAD): normal  Left circumflex (LCx): normal  Right coronary artery (RCA): normal  Left ventriculography: Left ventricular systolic function is normal, LVEF is estimated at 60-65%, there is no significant mitral regurgitation  Final Conclusions:  1. Normal coronary anatomy  2. Normal LV function  3. Severe aortic stenosis  4. Normal right heart pressures.  Recommendations: Refer for AV replacement.  Thedora Hinders, [facility]  09/23/2011, 11:22 AM    the patient was medically stable and the procedure was scheduled. On 10/14/2011 he underwent the following surgery:  OPERATIVE REPORT  PREOPERATIVE DIAGNOSIS: [QAMARKER].  POSTOPERATIVE DIAGNOSIS: Warner Mccreedy.  SURGICAL PROCEDURE: Aortic valve replacement with a #25 pericardial  tissue valve, [employer], model 3300TFX, serial [id number].  SURGEON: Sheliah Plane, MD  FIRST ASSISTANT: Doree Fudge, PA.  BRIEF HISTORY: The patient is a 76 year old male who has had a known  murmur noted on exam since 2006. Serial echocardiograms have showed  progressive aortic stenosis. The patient recently began having  increasing symptoms of primarily dyspnea on exertion, was seen by Dr.  Patty Sermons who arranged cardiac catheterization.  This showed the patient  had no evidence of coronary occlusive disease, with his asymptomatic  critical aortic stenosis, aortic valve replacement was recommended. By echo aortic valve velocity 405cm/sec, peak 64mm mean gradient 38mm echo AVR 0.8.  Risks and options were discussed with the patient, including the risks  of long-term Coumadin versus a tissue valve. With the patient's age,  tissue valve was selected as optimal. The patient signed an informed  consent. He tolerated the procedure well and was taken to the surgical intensive care unit in stable condition.     Post Operative Hospital Course:  Overall the patient has done quite well. He was weaned from the ventilator without difficulty. He has remained neurologically intact. All routine lines, monitors, drainage devices have all been discontinued in the standard fashion. He has had postoperative atrial fibrillation. This was short-lived and he is maintaining sinus rhythm. He does have a moderate postoperative volume overload which is responding well to diuretics. This will continue at least short-term as an outpatient as well. He does have an acute blood loss anemia and will be on both iron and folate acid. Additionally, he does have a postoperative thrombocytopenia. His most recent platelet count is 70,000. Incision is healing well without evidence of infection. He is tolerating gradually increasing activities using standard cardiac rehabilitation protocols. Oxygen has been weaned and he maintains good saturations on room air. He does have a mild hyponatremia with most recent sodium 128. Currently his status is felt to be stable for tentative discharge in the next 1-2 days pending ongoing recovery and further reevaluation.   Basename 10/16/11 0530 10/15/11 0419  NA 128* 131*  K 4.2 4.1  CL 95* 99  CO2 26 27  GLUCOSE 106* 110*  BUN 22 17  CALCIUM 8.2* 8.1*    Basename 10/16/11 0530 10/15/11 0419  WBC 5.5 6.3  HGB 8.4* 9.3*  HCT  23.2* 25.8*  PLT 70* 69*   No results found for this basename: INR:2 in the last 72 hours   Discharge Instructions:  The patient is discharged to home with extensive instructions on wound care and progressive ambulation.  They are instructed not to drive or perform any heavy lifting until returning to see the physician in his office.  Discharge Diagnosis:  AS Post operative atrial fibrillation Postoperative expected acute blood loss anemia Postoperative thrombocytopenia Secondary Diagnosis: Patient Active Problem List  Diagnoses  . Osteoarthritis  . Aortic stenosis, severe  . Sleep apnea   Past Medical History  Diagnosis Date  . Hyperlipidemia   . Aortic stenosis, severe   . Osteoarthritis      End-stage osteoarthritis--left knee  . History of hiatal hernia   . History of anemia of chronic  disease   . Hyponatremia     Mild hyponatremia, allowed to self-correct, asymptomatic  . Sleep apnea      4 yrs ago     no cpap  . Aortic valve defect   . Hypertension     dr Gemma Payor  Home Medication Instructions [id number]   Printed on:10/17/11 1232  Medication Information                    simvastatin (ZOCOR) 20 MG tablet Take 20 mg by mouth at bedtime.             ferrous fumarate-iron polysaccharide complex (TANDEM) 162-115.2 MG CAPS Take 1 capsule by mouth daily with breakfast.             fish oil-omega-3 fatty acids 1000 MG capsule Take 3 g by mouth daily.            fluocinonide cream (LIDEX) 0.05 % Apply 1 application topically 2 (two) times daily as needed. Apply to hand rash as needed.           aspirin EC 325 MG EC tablet Take 1 tablet (325 mg total) by mouth daily.           folic acid (FOLVITE) 1 MG tablet Take 1 tablet (1 mg total) by mouth daily.           furosemide (LASIX) 40 MG tablet Take 1 tablet (40 mg total) by mouth daily.           metoprolol tartrate (LOPRESSOR) 25 MG tablet Take 1 tablet (25 mg total) by  mouth 2 (two) times daily.           oxyCODONE (OXY IR/ROXICODONE) 5 MG immediate release tablet Take 1 tablet (5 mg total) by mouth every 4 (four) hours as needed.           potassium chloride SA (K-DUR,KLOR-CON) 20 MEQ tablet Take 1 tablet (20 mEq total) by mouth daily.             Disposition: For discharge home  Patient's condition is Good  Gershon Crane, PA-C 10/17/2011  12:32 PM

## 2011-10-17 NOTE — Progress Notes (Signed)
Pt ambulated 550 ft with walker and RN. Pt tolerated well with no signs of distress. Otilio Connors RN

## 2011-10-18 LAB — CBC
Platelets: 134 10*3/uL — ABNORMAL LOW (ref 150–400)
RBC: 2.43 MIL/uL — ABNORMAL LOW (ref 4.22–5.81)
RDW: 12.5 % (ref 11.5–15.5)
WBC: 4.2 10*3/uL (ref 4.0–10.5)

## 2011-10-18 MED ORDER — TRAMADOL HCL 50 MG PO TABS: 50.0000 mg | ORAL_TABLET | ORAL | Status: AC | PRN

## 2011-10-18 NOTE — Progress Notes (Addendum)
                    [address]            [place] [address]          [phone]     5 Days Post-Op Procedure(s) (LRB): AORTIC VALVE REPLACEMENT (AVR) (N/A)  Subjective: Feels well.  Appetite good, + BM.  No complaints.  Objective: Vital signs in last 24 hours: Patient Vitals for the past 24 hrs:  BP Temp Temp src Pulse Resp SpO2 Weight  10/18/11 0445 119/71 mmHg 98.2 F (36.8 C) Oral 83  19  100 % 103.193 kg (227 lb 8 oz)  10/17/11 2050 113/64 mmHg 98.4 F (36.9 C) Oral 90  18  96 % -  10/17/11 1404 107/67 mmHg 98.3 F (36.8 C) Oral 89  18  96 % -  10/17/11 1027 118/69 mmHg - - 96  - - -  10/17/11 0935 - - - 104  - 95 % -  10/17/11 0925 - - - 88  - 93 % -   Current Weight  10/18/11 103.193 kg (227 lb 8 oz)   Pre-op wt= 101.2 kg   Intake/Output from previous day: 02/15 0701 - 02/16 0700 In: 360 [P.O.:360] Out: 1101 [Urine:1100; Stool:1]    PHYSICAL EXAM:  Heart: RRR Lungs: clear Wound: clean and dry Extremities: trace LE edema  Lab Results: CBC: Basename 10/18/11 0600 10/16/11 0530  WBC 4.2 5.5  HGB 8.0* 8.4*  HCT 22.3* 23.2*  PLT 134* 70*   BMET:  Basename 10/16/11 0530  NA 128*  K 4.2  CL 95*  CO2 26  GLUCOSE 106*  BUN 22  CREATININE 0.76  CALCIUM 8.2*    PT/INR: No results found for this basename: LABPROT,INR in the last 72 hours  Assessment/Plan: S/P Procedure(s) (LRB): AORTIC VALVE REPLACEMENT (AVR) (N/A) CV- stable, SR.  Continue beta blocker. ABL anemia- H/H down slightly.  Continue Fe. Thrombocytopenia- Plts up today. D/C EPW, home this am.   LOS: 5 days    COLLINS,GINA H 10/18/2011   I have seen and examined the patient and agree with the assessment and plan as outlined.  Trentin Knappenberger H 10/18/2011 1:05 PM

## 2011-10-18 NOTE — Progress Notes (Signed)
Pt. Discharged 10/18/2011  2:48 PM Discharge instructions reviewed with patient/family. Patient/family verbalized understanding. All Rx's given. Questions answered as needed. Pt. Discharged to home with family/self. Taken off unit via W/C. Kaileia Flow, Chrystine Oiler

## 2011-10-18 NOTE — Progress Notes (Signed)
EPW discontinued per protocol. Tips intact. Patient tolerated well. Last INR INR/Prothrombin Time on 10-13-11= 1.67.  Patient advised Bedrest X 1 hour.  Frequent V/S per protocol. Harlow Asa

## 2011-10-20 ENCOUNTER — Other Ambulatory Visit: Payer: Self-pay | Admitting: Surgical

## 2011-10-20 ENCOUNTER — Other Ambulatory Visit: Payer: Medicare Other | Admitting: *Deleted

## 2011-10-29 ENCOUNTER — Ambulatory Visit (INDEPENDENT_AMBULATORY_CARE_PROVIDER_SITE_OTHER): Payer: Medicare Other | Admitting: Cardiology

## 2011-10-29 ENCOUNTER — Encounter: Payer: Self-pay | Admitting: Cardiology

## 2011-10-29 VITALS — BP 120/70 | Ht 74.0 in | Wt 210.0 lb

## 2011-10-29 DIAGNOSIS — I350 Nonrheumatic aortic (valve) stenosis: Secondary | ICD-10-CM

## 2011-10-29 DIAGNOSIS — Z954 Presence of other heart-valve replacement: Secondary | ICD-10-CM

## 2011-10-29 DIAGNOSIS — D649 Anemia, unspecified: Secondary | ICD-10-CM

## 2011-10-29 DIAGNOSIS — Z953 Presence of xenogenic heart valve: Secondary | ICD-10-CM

## 2011-10-29 DIAGNOSIS — I359 Nonrheumatic aortic valve disorder, unspecified: Secondary | ICD-10-CM

## 2011-10-29 NOTE — Patient Instructions (Addendum)
Your physician recommends that you continue on your current medications as directed. Please refer to the Current Medication list given to you today. Will obtain labs today and call you with the results (bmet/cbc)  Will have you come back for follow up on 4/30 at 4:15

## 2011-10-29 NOTE — Assessment & Plan Note (Signed)
The patient is doing well post surgery.  No significant chest pain or shortness of breath.  No chills or fever.  No constitutional symptoms.  He is tolerating his present medications well.

## 2011-10-29 NOTE — Progress Notes (Signed)
Nicholas Walls Date of Birth:  [birth year] [facility] [address] [place], [place]  [address] [phone]  Fax   [phone]  HPI: This pleasant 76 year old gentleman is seen for a postoperative office visit.  He has a past history of severe aortic stenosis and underwent recent aortic valve replacement with a pericardial tissue valve on 10/13/2011 by Dr. Tyrone Sage.  The patient did well postoperatively and has done well since going home.  He has not had any awareness of palpitations or cardiac arrhythmia.  He is not having a significant chest pain.  He denies any dyspnea.  He denies presyncope.  His weight is down 15 pounds since preop and his appetite was initially poor but is improving.  His activity at home has been increasing gradually and he has been working with the physical therapist on climbing stairs properly etc.  Current Outpatient Prescriptions  Medication Sig Dispense Refill  . aspirin EC 325 MG EC tablet Take 1 tablet (325 mg total) by mouth daily.  30 tablet    . ferrous fumarate-iron polysaccharide complex (TANDEM) 162-115.2 MG CAPS Take 1 capsule by mouth daily with breakfast.        . fish oil-omega-3 fatty acids 1000 MG capsule Take 3 g by mouth daily.       . fluocinonide cream (LIDEX) 0.05 % Apply 1 application topically 2 (two) times daily as needed. Apply to hand rash as needed.      . folic acid (FOLVITE) 1 MG tablet Take 1 tablet (1 mg total) by mouth daily.  30 tablet  1  . metoprolol tartrate (LOPRESSOR) 25 MG tablet Take 1 tablet (25 mg total) by mouth 2 (two) times daily.  60 tablet  1  . simvastatin (ZOCOR) 20 MG tablet Take 20 mg by mouth at bedtime.        . traMADol (ULTRAM) 50 MG tablet Take 1-2 tablets (50-100 mg total) by mouth every 4 (four) hours as needed.  30 tablet  0    Allergies  Allergen Reactions  . Other     Ultram-seeing things    Patient Active Problem List  Diagnoses  . Osteoarthritis  . Aortic stenosis,  severe  . Sleep apnea    History  Smoking status  . Former Smoker  . Types: Cigarettes  . Quit date: 09/01/1981  Smokeless tobacco  . Never Used    History  Alcohol Use  . 1.2 oz/week  . 2 Cans of beer per week    weekly    Family History  Problem Relation Age of Onset  . Cancer Mother     throat  . Heart failure Father   . Cancer Father     Lung Cancer  . Hypertension Sister     Review of Systems: The patient denies any heat or cold intolerance.  No weight gain or weight loss.  The patient denies headaches or blurry vision.  There is no cough or sputum production.  The patient denies dizziness.  There is no hematuria or hematochezia.  The patient denies any muscle aches or arthritis.  The patient denies any rash.  The patient denies frequent falling or instability.  There is no history of depression or anxiety.  All other systems were reviewed and are negative.   Physical Exam: Filed Vitals:   10/29/11 1604  BP: 120/70   the general appearance reveals a well-developed well-nourished gentleman in no distress.Pupils equal and reactive.   Extraocular Movements are full.  There  is no scleral icterus.  The mouth and pharynx are normal.  The neck is supple.  The carotids reveal no bruits.  The jugular venous pressure is normal.  The thyroid is not enlarged.  There is no lymphadenopathy.  The chest is clear to percussion and auscultation. There are no rales or rhonchi. Expansion of the chest is symmetrical.  The heart reveals a soft systolic ejection murmur across the prosthetic aortic valve.  The aortic closure sound is good.  There is no aortic insufficiency.  There is no pericardial rub.  There is no gallop. The abdomen is soft and nontender. Bowel sounds are normal. The liver and spleen are not enlarged. There Are no abdominal masses. There are no bruits.  The pedal pulses are good.  There is no phlebitis or edema.  There is no cyanosis or clubbing. Strength is normal and  symmetrical in all extremities.  There is no lateralizing weakness.  There are no sensory deficits.  His electrocardiogram today shows normal sinus rhythm at 72 per minute.  Intervals are normal.  The tracing is within normal limits.    Assessment / Plan: The patient is doing very well post aortic valve replacement.  He is to continue same medication.  We're checking a CBC and a basal metabolic panel today.  We will see him for followup office visit in 2 months.

## 2011-10-30 LAB — BASIC METABOLIC PANEL
CO2: 28 mEq/L (ref 19–32)
Chloride: 102 mEq/L (ref 96–112)
Creatinine, Ser: 0.9 mg/dL (ref 0.4–1.5)
Potassium: 4.2 mEq/L (ref 3.5–5.1)
Sodium: 137 mEq/L (ref 135–145)

## 2011-10-30 LAB — CBC WITH DIFFERENTIAL/PLATELET
Basophils Relative: 1.1 % (ref 0.0–3.0)
Eosinophils Relative: 5.7 % — ABNORMAL HIGH (ref 0.0–5.0)
HCT: 30.3 % — ABNORMAL LOW (ref 39.0–52.0)
Hemoglobin: 10.2 g/dL — ABNORMAL LOW (ref 13.0–17.0)
MCV: 94.7 fl (ref 78.0–100.0)
Monocytes Absolute: 0.5 10*3/uL (ref 0.1–1.0)
Neutro Abs: 3 10*3/uL (ref 1.4–7.7)
Neutrophils Relative %: 64.5 % (ref 43.0–77.0)
RBC: 3.2 Mil/uL — ABNORMAL LOW (ref 4.22–5.81)
WBC: 4.6 10*3/uL (ref 4.5–10.5)

## 2011-11-05 ENCOUNTER — Telehealth: Payer: Self-pay | Admitting: *Deleted

## 2011-11-05 NOTE — Telephone Encounter (Signed)
Message copied by Burnell Blanks on Wed Nov 05, 2011  4:49 PM ------      Message from: Cassell Clement      Created: Sun Nov 02, 2011  9:18 PM       Please report.  Chemistries are excellent. Hgb still low following surgery so would continue taking the iron tablets another month or so.  Recheck CBC when he returns for next OV.

## 2011-11-05 NOTE — Telephone Encounter (Signed)
Advised patient

## 2011-11-12 ENCOUNTER — Other Ambulatory Visit: Payer: Self-pay | Admitting: Cardiothoracic Surgery

## 2011-11-12 DIAGNOSIS — I359 Nonrheumatic aortic valve disorder, unspecified: Secondary | ICD-10-CM

## 2011-11-13 ENCOUNTER — Encounter: Payer: Self-pay | Admitting: Cardiothoracic Surgery

## 2011-11-13 ENCOUNTER — Ambulatory Visit (INDEPENDENT_AMBULATORY_CARE_PROVIDER_SITE_OTHER): Payer: Self-pay | Admitting: Cardiothoracic Surgery

## 2011-11-13 ENCOUNTER — Ambulatory Visit
Admission: RE | Admit: 2011-11-13 | Discharge: 2011-11-13 | Disposition: A | Discharge status: Home / Self Care | Payer: No Typology Code available for payment source | Source: Ambulatory Visit | Attending: Cardiothoracic Surgery | Admitting: Cardiothoracic Surgery

## 2011-11-13 VITALS — BP 114/64 | HR 67 | Resp 20 | Ht 74.0 in | Wt 208.0 lb

## 2011-11-13 DIAGNOSIS — I350 Nonrheumatic aortic (valve) stenosis: Secondary | ICD-10-CM

## 2011-11-13 DIAGNOSIS — I359 Nonrheumatic aortic valve disorder, unspecified: Secondary | ICD-10-CM

## 2011-11-13 DIAGNOSIS — Z952 Presence of prosthetic heart valve: Secondary | ICD-10-CM

## 2011-11-13 DIAGNOSIS — Z954 Presence of other heart-valve replacement: Secondary | ICD-10-CM

## 2011-11-13 NOTE — Progress Notes (Signed)
[address]            [place] [address]          [phone]       TRACIE LINDBLOOM [facility] Medical Record #[id number] Date of Birth: 08-28-1936  Nicholas Clement, MD [facility], DO, DO  Chief Complaint:   PostOp Follow Up Visit SURGICAL PROCEDURE: Aortic valve replacement with a #25 pericardial  tissue valve, [employer], model 3300TFX, serial [id number].  SURGEON: Sheliah Plane, MD 10/13/2011  History of Present Illness:      Doing well post op, Increasing activity without difficulty, no chest pain or CHF         History  Smoking status  . Former Smoker  . Types: Cigarettes  . Quit date: 09/01/1981  Smokeless tobacco  . Never Used       Allergies  Allergen Reactions  . Other     Ultram-seeing things    Current Outpatient Prescriptions  Medication Sig Dispense Refill  . aspirin EC 325 MG EC tablet Take 1 tablet (325 mg total) by mouth daily.  30 tablet    . ferrous fumarate-iron polysaccharide complex (TANDEM) 162-115.2 MG CAPS Take 1 capsule by mouth daily with breakfast.        . fish oil-omega-3 fatty acids 1000 MG capsule Take 3 g by mouth daily.       . fluocinonide cream (LIDEX) 0.05 % Apply 1 application topically 2 (two) times daily as needed. Apply to hand rash as needed.      . folic acid (FOLVITE) 1 MG tablet Take 1 tablet (1 mg total) by mouth daily.  30 tablet  1  . metoprolol tartrate (LOPRESSOR) 25 MG tablet Take 1 tablet (25 mg total) by mouth 2 (two) times daily.  60 tablet  1  . simvastatin (ZOCOR) 20 MG tablet Take 20 mg by mouth at bedtime.             Physical Exam: BP 114/64  Pulse 67  Resp 20  Ht 6\' 2"  (1.88 m)  Wt 208 lb (94.348 kg)  BMI 26.71 kg/m2  SpO2 98%  General appearance: alert and cooperative Neurologic: intact Heart: regular rate and rhythm, S1, S2 normal, no murmur, click, rub or gallop Lungs: clear to auscultation bilaterally Abdomen: soft, non-tender;  bowel sounds normal; no masses,  no organomegaly Extremities: extremities normal, atraumatic, no cyanosis or edema and Homans sign is negative, no sign of DVT Wound: sternum stable   Diagnostic Studies & Laboratory data:         Recent Radiology Findings: Dg Chest 2 View  11/13/2011  *RADIOLOGY REPORT*  Clinical Data: Aortic valve replacement February 2013  CHEST - 2 VIEW  Comparison: October 14, 2011  Findings: The cardiac silhouette, mediastinum, pulmonary vasculature are within normal limits.  Minute effusions persist bilaterally.  Prior sternotomy and aortic valve replacement changes are stable.  IMPRESSION: There are minute persistent effusions bilaterally, improved compared with the prior study.  Original Report Authenticated By: Brandon Melnick, M.D.      Recent Labs: Lab Results  Component Value Date   WBC 4.6 10/29/2011   HGB 10.2* 10/29/2011   HCT 30.3* 10/29/2011   PLT 464.0* 10/29/2011   GLUCOSE 94 10/29/2011   ALT 20 10/09/2011   AST 20 10/09/2011   NA 137 10/29/2011   K 4.2 10/29/2011   CL  102 10/29/2011   CREATININE 0.9 10/29/2011   BUN 19 10/29/2011   CO2 28 10/29/2011   INR 1.64* 10/13/2011   HGBA1C 4.9 10/09/2011      Assessment / Plan:     Stable post AVR With the patient's prosthetic heart valve the risks of endocarditis have been discussed. The recommendations for periprocedural antibiotics including dental procedures have been discussed with the patient.       [name] MD 11/13/2011 11:20 AM

## 2011-11-13 NOTE — Patient Instructions (Addendum)
Endocarditis Information  You may be at risk for developing endocarditis since you have  an artificial heart valve  or a repaired heart valve. Endocarditis is an infection of the lining of the heart or heart valves.   Certain surgical and dental procedures may put you at risk,  such as teeth cleaning or other dental procedures or any surgery involving the respiratory, urinary, gastrointestinal tract, gallbladder or prostate.   Notify your doctor or dentist before having any invasive procedures. You will need to take antibiotics before certain procedures.   To prevent endocarditis, maintain good oral health. Seek prompt medical attention for any mouth/gum, skin or urinary tract infections.      Aortic Valve Replacement Care After Read the instructions outlined below and refer to this sheet for the next few weeks. These discharge instructions provide you with general information on caring for yourself after you leave the hospital. Your surgeon may also give you specific instructions. While your treatment has been planned according to the most current medical practices available, unavoidable complications occasionally occur. If you have any problems or questions after discharge, please call your surgeon. AFTER THE PROCEDURE  Full recovery from heart valve surgery can take several months.   Blood thinning (anticoagulation) treatment with warfarin is often prescribed for 6 weeks to 3 months after surgery for those with biological valves. It is prescribed for life for those with mechanical valves.   Recovery includes healing of the surgical incision. There is a gradual building of stamina and exercise abilities. An exercise program under the direction of a physical therapist may be recommended.   Once you have an artificial valve, your heart function and your life will return to normal. You usually feel better after surgery. Shortness of breath and fatigue should lessen. If your heart was already  severely damaged before your surgery, you may continue to have problems.   You can usually resume most of your normal activities. You will have to continue to monitor your condition. You need to watch out for blood clots and infections.   Artificial valves need to be replaced after a period of time. It is important that you see your caregiver regularly.   Some individuals with an aortic valve replacement need to take antibiotics before having dental work or other surgical procedures. This is called prophylactic antibiotic treatment. These drugs help to prevent infective endocarditis. Antibiotics are only recommended for individuals with the highest risk for developing infective endocarditis. Let your dentist and your caregiver know if you have a history of any of the following so that the necessary precautions can be taken:   A VSD.   A repaired VSD.   Endocarditis in the past.   An artificial (prosthetic) heart valve.  HOME CARE INSTRUCTIONS    Use all medications as prescribed.   Take your temperature every morning for the first week after surgery. Record these.   Weigh yourself every morning for at least the first week after surgery and record.   Do not lift more than 10 pounds (4.5 kg) until your breastbone (sternum) has healed. Avoid all activities which would place strain on your incision.   You may shower as soon as directed by your caregiver after surgery. Pat incisions dry. Do not rub incisions with washcloth or towel.   Avoid driving for 4 to 6 weeks following surgery or as instructed.   Use your elastic stockings during the day. You should wear the stockings for at least 2 weeks after discharge or   longer if your ankles are swollen. The stockings help blood flow and help reduce swelling in the legs. It is easiest to put the stockings on before you get out of bed in the morning. They should fit snugly.  Pain Control  If a prescription was given for a pain reliever, please  follow your doctor's directions.   If the pain is not relieved by your medicine, becomes worse, or you have difficulty breathing, call your surgeon.  Activity  Take frequent rest periods throughout the day.   Wait one week before returning to strenuous activities such as heavy lifting (more than 10 pounds), pushing or pulling.   Talk with your doctor about when you may return to work and your exercise routine.   Do not drive while taking prescription pain medication.  Nutrition  You may resume your normal diet.   Drink plenty of fluids (6-8 glasses a day).   Eat a well-balanced diet.   Call your caregiver for persistent nausea or vomiting.  Elimination Your normal bowel function should return. If constipation should occur, you may:  Take a mild laxative.   Add fruit and bran to your diet.   Drink more fluids.   Call your doctor if constipation is not relieved.  SEEK IMMEDIATE MEDICAL CARE IF:    You develop chest pain which is not coming from your surgical cut (incision).   You develop shortness of breath or have difficulty breathing.   You develop a temperature over 101 F (38.3 C).   You have a sudden weight gain. Let your caregiver know what the weight gain is.   You develop a rash.   You develop any reaction or side effects to medications given.   You have increased bleeding from wounds.   You see redness, swelling, or have increasing pain in wounds.   You have pus coming from your wound.   You develop lightheadedness or feel faint.  Document Released: 03/06/2005 Document Revised: 08/07/2011 Document Reviewed: 05/28/2005 Exit[employer] Patient Information 2012 [employer]. 

## 2011-11-16 ENCOUNTER — Encounter: Payer: Self-pay | Admitting: Cardiothoracic Surgery

## 2011-11-25 ENCOUNTER — Encounter: Payer: Self-pay | Admitting: Nurse Practitioner

## 2011-11-25 ENCOUNTER — Ambulatory Visit (INDEPENDENT_AMBULATORY_CARE_PROVIDER_SITE_OTHER): Payer: Medicare Other | Admitting: Nurse Practitioner

## 2011-11-25 VITALS — BP 108/70 | HR 64 | Ht 74.0 in | Wt 209.0 lb

## 2011-11-25 DIAGNOSIS — I359 Nonrheumatic aortic valve disorder, unspecified: Secondary | ICD-10-CM

## 2011-11-25 DIAGNOSIS — T07XXXA Unspecified multiple injuries, initial encounter: Secondary | ICD-10-CM

## 2011-11-25 DIAGNOSIS — IMO0002 Reserved for concepts with insufficient information to code with codable children: Secondary | ICD-10-CM

## 2011-11-25 DIAGNOSIS — Z953 Presence of xenogenic heart valve: Secondary | ICD-10-CM

## 2011-11-25 MED ORDER — CEPHALEXIN 250 MG PO CAPS: 250.0000 mg | ORAL_CAPSULE | Freq: Four times a day (QID) | ORAL | Status: AC

## 2011-11-25 NOTE — Patient Instructions (Signed)
We are going to put you on Keflex 250 mg four times a day for the next 5 days.  We will check your white count today looking for signs of infection  Keep a check on this site and let us know if it gets worse.  Call the [facility] office at [phone] if you have any questions, problems or concerns.

## 2011-11-25 NOTE — Assessment & Plan Note (Signed)
Patient presents for a wound check. Does have a pimple-like lesion over the incision. I have placed him on prophylactic antibiotics with Keflex 250 mg QID for 4 days. Will check a CBC today. He will continue to monitor this site at home and will be in touch with [place] for any worsening of symptoms. His daughter, Juliette Alcide (Dr. Yevonne Pax nurse) will monitor as well. Patient is agreeable to this plan and will call if any problems develop in the interim.

## 2011-11-25 NOTE — Progress Notes (Signed)
Nicholas Walls Date of Birth: April 30, 1936 Medical Record #[id number]  History of Present Illness: Nicholas Walls is seen today for a work in visit. He is seen for Dr. Patty Sermons. He has had recent AVR with a pericardial tissue valve per Dr. Tyrone Sage back in February. He has done very well. He has had no complaints. He comes in today because of concern about a place on his sternum. He noted that he saw a "red place" yesterday. Thought he might have an infection. He denies any fever or chills. He has had no drainage from his sternum.   Current Outpatient Prescriptions on File Prior to Visit  Medication Sig Dispense Refill  . aspirin EC 325 MG EC tablet Take 1 tablet (325 mg total) by mouth daily.  30 tablet    . ferrous fumarate-iron polysaccharide complex (TANDEM) 162-115.2 MG CAPS Take 1 capsule by mouth daily with breakfast.        . fish oil-omega-3 fatty acids 1000 MG capsule Take 3 g by mouth daily.       . fluocinonide cream (LIDEX) 0.05 % Apply 1 application topically 2 (two) times daily as needed. Apply to hand rash as needed.      . metoprolol tartrate (LOPRESSOR) 25 MG tablet Take 1 tablet (25 mg total) by mouth 2 (two) times daily.  60 tablet  1  . simvastatin (ZOCOR) 20 MG tablet Take 20 mg by mouth at bedtime.          Allergies  Allergen Reactions  . Other     Ultram-seeing things    Past Medical History  Diagnosis Date  . Hyperlipidemia   . Aortic stenosis, severe     s/p AVR with pericardial tissue valve Feb 2013 per Dr. Tyrone Sage  . Osteoarthritis      End-stage osteoarthritis--left knee  . History of hiatal hernia   . History of anemia of chronic disease   . Hyponatremia     Mild hyponatremia, allowed to self-correct, asymptomatic  . Sleep apnea      4 yrs ago     no cpap  . Aortic valve defect   . Hypertension     Past Surgical History  Procedure Date  . Cholecystectomy   . Total knee arthroplasty     bilateral  . Joint replacement     bil knees  .  Cervical disc surgery   . Cardiac catheterization   . Aortic valve replacement 10/13/2011    Procedure: AORTIC VALVE REPLACEMENT (AVR);  Surgeon: [name], MD;  Location[facility] OR;  Service: Open Heart Surgery;  Laterality: N/A;    History  Smoking status  . Former Smoker  . Types: Cigarettes  . Quit date: 09/01/1981  Smokeless tobacco  . Never Used    History  Alcohol Use  . 1.2 oz/week  . 2 Cans of beer per week    weekly    Family History  Problem Relation Age of Onset  . Cancer Mother     throat  . Heart failure Father   . Cancer Father     Lung Cancer  . Hypertension Sister     Review of Systems: The review of systems is per the HPI.   All other systems were reviewed and are negative.  Physical Exam: BP 108/70  Pulse 64  Wt 209 lb (94.802 kg) Patient is very pleasant and in no acute distress. Skin is warm and dry. Color is normal.  HEENT is unremarkable. Normocephalic/atraumatic. PERRL. Sclera  are nonicteric. Neck is supple. No masses. No JVD. Lungs are clear. Cardiac exam shows a regular rate and rhythm. He has a soft outflow murmur. His sternum looks ok but he does have a pimple like lesion at the upper portion of the incision. It is red. No drainage. It is almost the size of a dime. Abdomen is soft. Extremities are without edema. Gait and ROM are intact. No gross neurologic deficits noted.  LABORATORY DATA:   Assessment / Plan:

## 2011-11-26 ENCOUNTER — Telehealth: Payer: Self-pay | Admitting: Cardiology

## 2011-11-26 ENCOUNTER — Ambulatory Visit: Payer: Medicare Other | Admitting: Nurse Practitioner

## 2011-11-26 LAB — CBC WITH DIFFERENTIAL/PLATELET
Basophils Absolute: 0 10*3/uL (ref 0.0–0.1)
Basophils Relative: 0.4 % (ref 0.0–3.0)
Eosinophils Absolute: 0.5 10*3/uL (ref 0.0–0.7)
Eosinophils Relative: 13.1 % — ABNORMAL HIGH (ref 0.0–5.0)
HCT: 34.9 % — ABNORMAL LOW (ref 39.0–52.0)
Hemoglobin: 11.6 g/dL — ABNORMAL LOW (ref 13.0–17.0)
Lymphocytes Relative: 25.1 % (ref 12.0–46.0)
Lymphs Abs: 1 10*3/uL (ref 0.7–4.0)
MCHC: 33.1 g/dL (ref 30.0–36.0)
MCV: 88.4 fl (ref 78.0–100.0)
Monocytes Absolute: 0.4 10*3/uL (ref 0.1–1.0)
Monocytes Relative: 9.4 % (ref 3.0–12.0)
Neutro Abs: 2 10*3/uL (ref 1.4–7.7)
Neutrophils Relative %: 52 % (ref 43.0–77.0)
Platelets: 179 10*3/uL (ref 150.0–400.0)
RBC: 3.94 Mil/uL — ABNORMAL LOW (ref 4.22–5.81)
RDW: 15.3 % — ABNORMAL HIGH (ref 11.5–14.6)
WBC: 3.9 10*3/uL — ABNORMAL LOW (ref 4.5–10.5)

## 2011-11-26 NOTE — Telephone Encounter (Signed)
Pt aware of lab results and recommendations to monitor "stitch" He will call back if he has any problems. Mylo Red RN

## 2011-11-26 NOTE — Telephone Encounter (Signed)
Patient returning nurse D. Leffler call, He can be reached at [phone]

## 2011-12-08 ENCOUNTER — Other Ambulatory Visit: Payer: Self-pay | Admitting: Cardiology

## 2011-12-08 NOTE — Telephone Encounter (Signed)
Refilled metoprolol 

## 2011-12-30 ENCOUNTER — Encounter: Payer: Self-pay | Admitting: Cardiology

## 2011-12-30 ENCOUNTER — Ambulatory Visit (INDEPENDENT_AMBULATORY_CARE_PROVIDER_SITE_OTHER): Payer: Medicare Other | Admitting: Cardiology

## 2011-12-30 VITALS — BP 110/60 | HR 80 | Ht 75.0 in | Wt 204.0 lb

## 2011-12-30 DIAGNOSIS — D649 Anemia, unspecified: Secondary | ICD-10-CM

## 2011-12-30 DIAGNOSIS — Z954 Presence of other heart-valve replacement: Secondary | ICD-10-CM

## 2011-12-30 DIAGNOSIS — E7800 Pure hypercholesterolemia, unspecified: Secondary | ICD-10-CM

## 2011-12-30 DIAGNOSIS — E785 Hyperlipidemia, unspecified: Secondary | ICD-10-CM | POA: Insufficient documentation

## 2011-12-30 DIAGNOSIS — R42 Dizziness and giddiness: Secondary | ICD-10-CM

## 2011-12-30 DIAGNOSIS — Z953 Presence of xenogenic heart valve: Secondary | ICD-10-CM

## 2011-12-30 NOTE — Assessment & Plan Note (Signed)
The patient has done well with no complications following his aortic valve replacement.  In late March he had a slight red place along his sternum which responded to a 4 day course of Keflex 250 mg 4 times a day.  His CBC at the time showed a normal white count.

## 2011-12-30 NOTE — Patient Instructions (Addendum)
Will obtain labs today and call you with the results (cbc/bmet)  Increase your salt intake a little  Come back and see  Dr. Patty Sermons on 02/27/12 at 4:00 pm

## 2011-12-30 NOTE — Progress Notes (Signed)
Nicholas Walls Date of Birth:  1936-01-05 [facility] [address] [place], [place]  [address] [phone]  Fax   [phone]  HPI: This pleasant 76 year old gentleman is seen or a two-month followup office visit.  He has a previous history of severe aortic stenosis and underwent aortic valve replacement with a pericardial tissue valve on 10/13/11 by Dr. Tyrone Sage.  The patient has done well postoperatively.  He has not been aware of any palpitations.  He is not having any chest pain.  He denies any dyspnea.  He has had some transient lightheadedness when he stands up from a chair.  His appetite is good and he is eating well.  He is participating well in the cardiac rehabilitation program at the [facility].  He will finish the rehabilitation program in early June.  Current Outpatient Prescriptions  Medication Sig Dispense Refill  . aspirin EC 325 MG EC tablet Take 1 tablet (325 mg total) by mouth daily.  30 tablet    . ferrous fumarate-iron polysaccharide complex (TANDEM) 162-115.2 MG CAPS Take 1 capsule by mouth daily with breakfast.        . fish oil-omega-3 fatty acids 1000 MG capsule Take 3 g by mouth daily.       . fluocinonide cream (LIDEX) 0.05 % Apply 1 application topically 2 (two) times daily as needed. Apply to hand rash as needed.      . metoprolol tartrate (LOPRESSOR) 25 MG tablet TAKE 1 TABLET BY MOUTH TWICE DAILY  60 tablet  11  . simvastatin (ZOCOR) 20 MG tablet Take 20 mg by mouth at bedtime.          Allergies  Allergen Reactions  . Other     Ultram-seeing things    Patient Active Problem List  Diagnoses  . Osteoarthritis  . Aortic stenosis, severe  . Sleep apnea  . S/P aortic valve replacement with bioprosthetic valve  . Pure hypercholesterolemia  . Anemia    History  Smoking status  . Former Smoker  . Types: Cigarettes  . Quit date: 09/01/1981  Smokeless tobacco  . Never Used    History  Alcohol Use  . 1.2  oz/week  . 2 Cans of beer per week    weekly    Family History  Problem Relation Age of Onset  . Cancer Mother     throat  . Heart failure Father   . Cancer Father     Lung Cancer  . Hypertension Sister     Review of Systems: The patient denies any heat or cold intolerance.  No weight gain or weight loss.  The patient denies headaches or blurry vision.  There is no cough or sputum production.  The patient denies dizziness.  There is no hematuria or hematochezia.  The patient denies any muscle aches or arthritis.  The patient denies any rash.  The patient denies frequent falling or instability.  There is no history of depression or anxiety.  All other systems were reviewed and are negative.   Physical Exam: Filed Vitals:   12/30/11 1614  BP: 110/60  Pulse: 80   general appearance reveals a well-developed well-nourished gentleman in no distress.Pupils equal and reactive.   Extraocular Movements are full.  There is no scleral icterus.  The mouth and pharynx are normal.  The neck is supple.  The carotids reveal no bruits.  The jugular venous pressure is normal.  The thyroid is not enlarged.  There is no lymphadenopathy.  The  chest is clear to percussion and auscultation. There are no rales or rhonchi. Expansion of the chest is symmetrical.  The sternal incision is well-healed and there is no sign of infection.  The heart reveals a faint systolic flow murmur across the prosthetic aortic valve.  No diastolic murmur.  No gallop or rub.The abdomen is soft and nontender. Bowel sounds are normal. The liver and spleen are not enlarged. There Are no abdominal masses. There are no bruits.  The pedal pulses are good.  There is no phlebitis or edema.  There is no cyanosis or clubbing. Strength is normal and symmetrical in all extremities.  There is no lateralizing weakness.  There are no sensory deficits.      Assessment / Plan: The patient is doing very well.  He will continue in his cardiac  rehabilitation program and in mid May he may start to increase his activity further in terms of doing some light work.  He knows that he is not to do any heavy lifting.  He will finish up his cardiac rehabilitation program in early June.  He'll return here in 2 months for followup office visit.

## 2011-12-30 NOTE — Assessment & Plan Note (Signed)
The patient had had a history of mild anemia even prior to his open heart surgery.  He is on oral iron therapy.  We are checking a CBC today.

## 2011-12-30 NOTE — Assessment & Plan Note (Signed)
The patient notes occasional brief momentary lightheadedness when he stands up from a chair.  His systolic blood pressure today is 110 and there is no significant drop when he stands.  We will allow him to add a small amount of table salt to his food and see if he lightheaded symptoms resolve.  The patient will continue on his metoprolol 25 mg twice a day and his heart rate today is 80 and regular.

## 2011-12-31 ENCOUNTER — Telehealth: Payer: Self-pay | Admitting: *Deleted

## 2011-12-31 LAB — BASIC METABOLIC PANEL
Chloride: 106 mEq/L (ref 96–112)
GFR: 71.04 mL/min (ref >60.00)
Potassium: 4 mEq/L (ref 3.5–5.1)
Sodium: 141 mEq/L (ref 135–145)

## 2011-12-31 LAB — CBC WITH DIFFERENTIAL/PLATELET
Basophils Relative: 0.7 % (ref 0.0–3.0)
Eosinophils Relative: 7.6 % — ABNORMAL HIGH (ref 0.0–5.0)
HCT: 38 % — ABNORMAL LOW (ref 39.0–52.0)
Hemoglobin: 12.7 g/dL — ABNORMAL LOW (ref 13.0–17.0)
Lymphs Abs: 1.1 10*3/uL (ref 0.7–4.0)
MCV: 86.6 fl (ref 78.0–100.0)
Monocytes Absolute: 0.4 10*3/uL (ref 0.1–1.0)
Monocytes Relative: 10.8 % (ref 3.0–12.0)
RBC: 4.39 Mil/uL (ref 4.22–5.81)
WBC: 3.7 10*3/uL — ABNORMAL LOW (ref 4.5–10.5)

## 2011-12-31 NOTE — Telephone Encounter (Signed)
Advised patient

## 2011-12-31 NOTE — Telephone Encounter (Signed)
Message copied by Burnell Blanks on Wed Dec 31, 2011  3:20 PM ------      Message from: Cassell Clement      Created: Wed Dec 31, 2011 12:26 PM       Please report. Potassium is good. Kidney function is good. BS slightly high--watch sweets.            Hgb is improving but still slightly low. Continue same meds. Recheck CBC at next OV in 2 Month

## 2012-01-20 IMAGING — CT CT CERVICAL SPINE W/O CM
3 of 5 series · 15 of 33 positions shown, 18 images · non-contrast
Comparison: None.

CLINICAL DATA: Neck pain with left shoulder pain.  Arm tingling.

CT CERVICAL SPINE WITHOUT CONTRAST
TECHNIQUE: Multidetector CT imaging of the cervical spine was
performed. Multiplanar CT image reconstructions were also
generated.

[Series 200: sagittal · sagittal · 0.43mm/px · 5 of 40 slices shown, 6 images]
[im 14/40  bone]
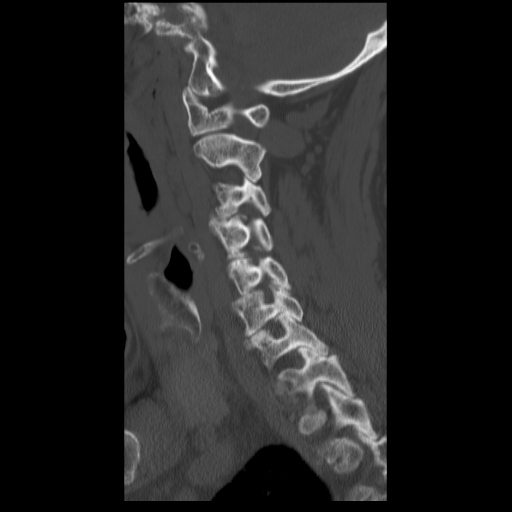
[im 17/40  bone]
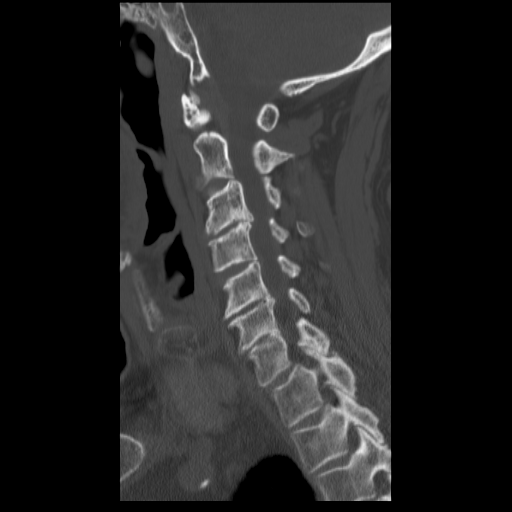
[im 20/40  soft-tissue]
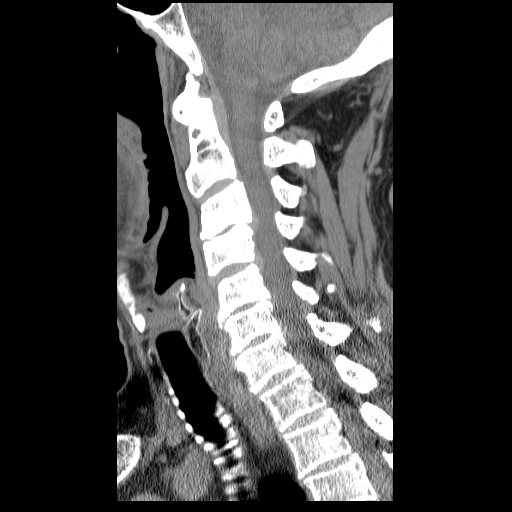
[im 20/40  bone]
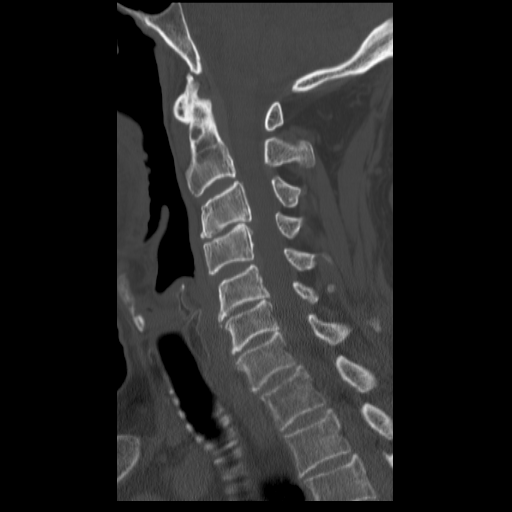
[im 23/40  bone]
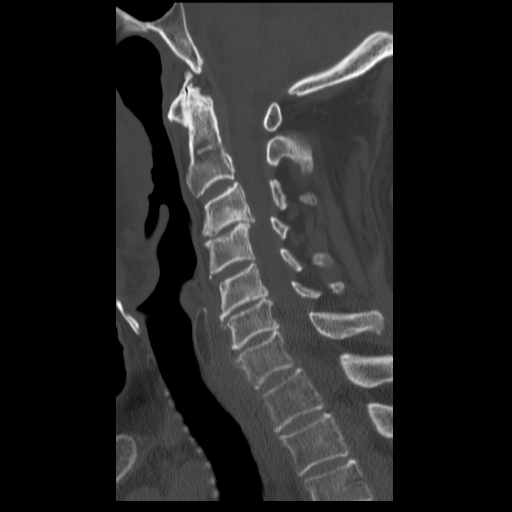
[im 27/40  bone]
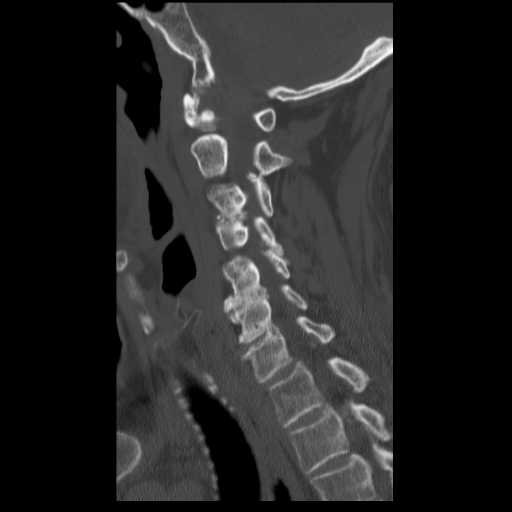

[Series 201: coronal · coronal · 0.43mm/px · 3 of 40 slices shown]
[im 8/40  bone]
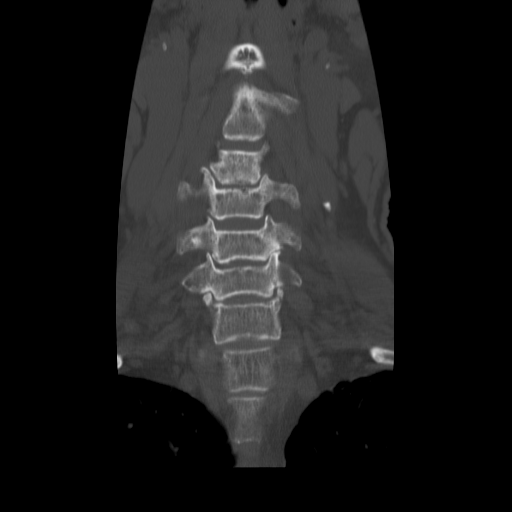
[im 16/40  bone]
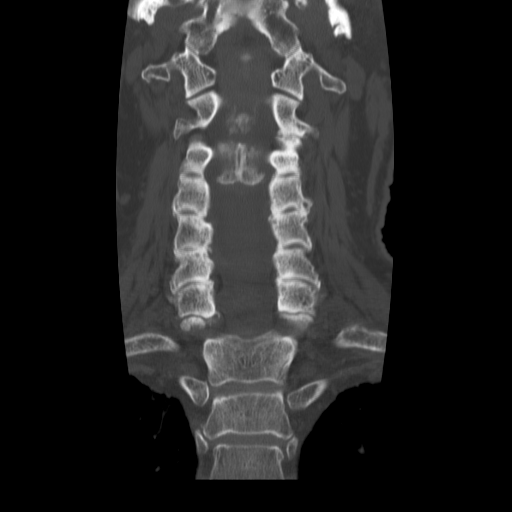
[im 24/40  bone]
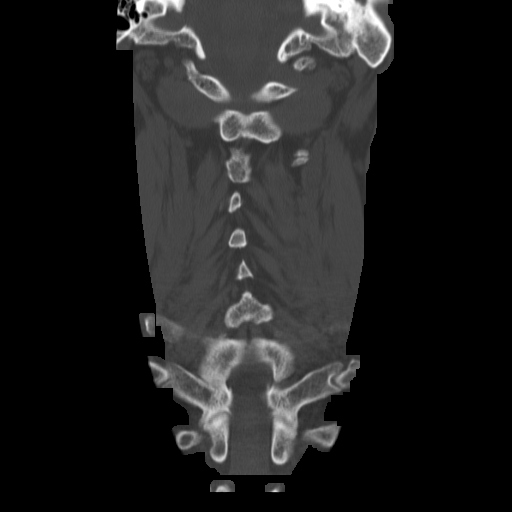

[Series 202: angled axial · axial · 0.20mm/px · z∈[+66,+207]mm · 7 of 280 slices shown, 9 images]
[im 35/280  soft-tissue]
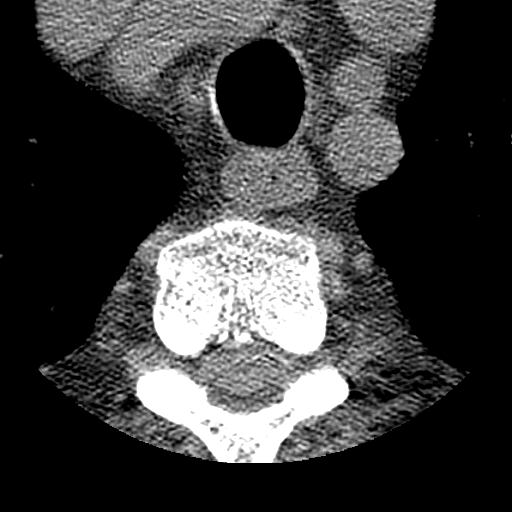
[im 35/280  bone]
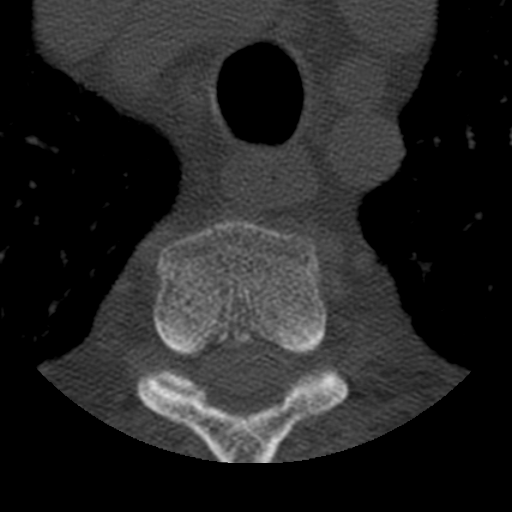
[im 70/280  bone]
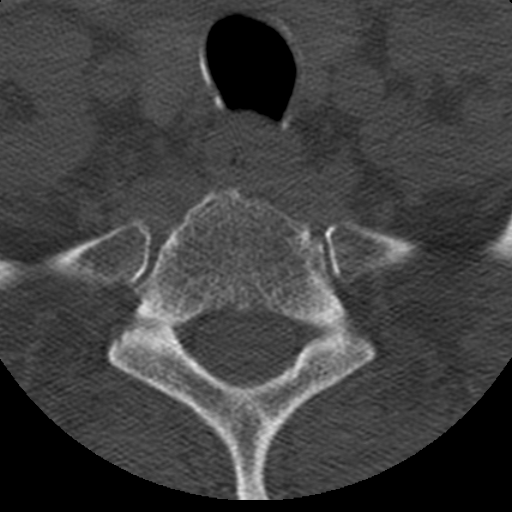
[im 105/280  bone]
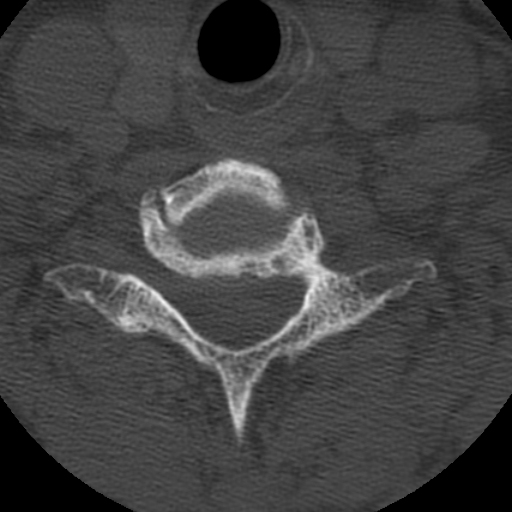
[im 140/280  bone]
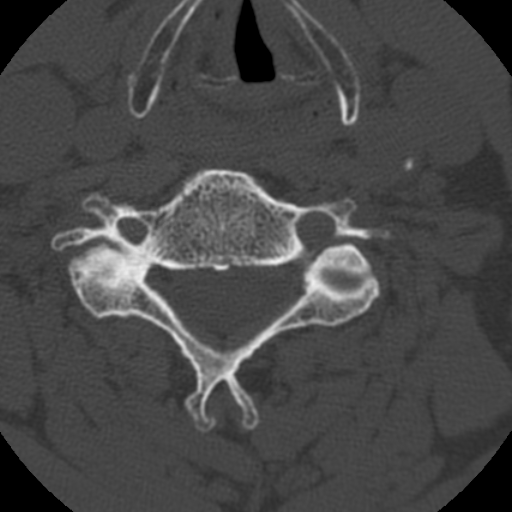
[im 175/280  soft-tissue]
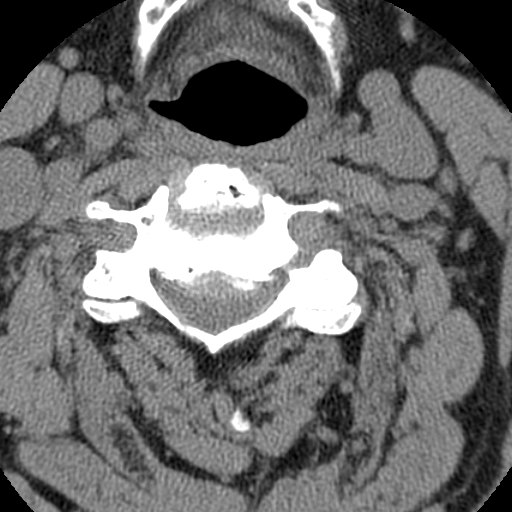
[im 175/280  bone]
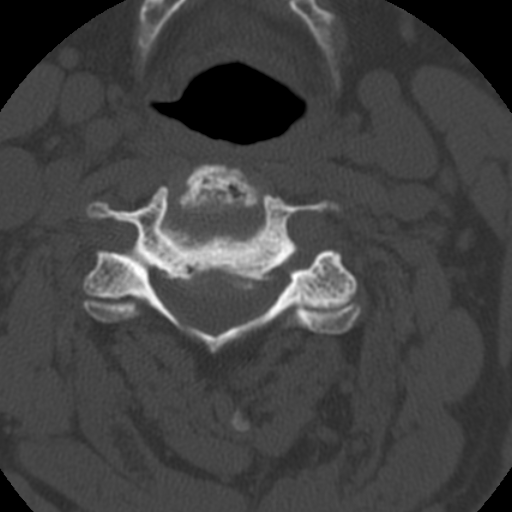
[im 210/280  bone]
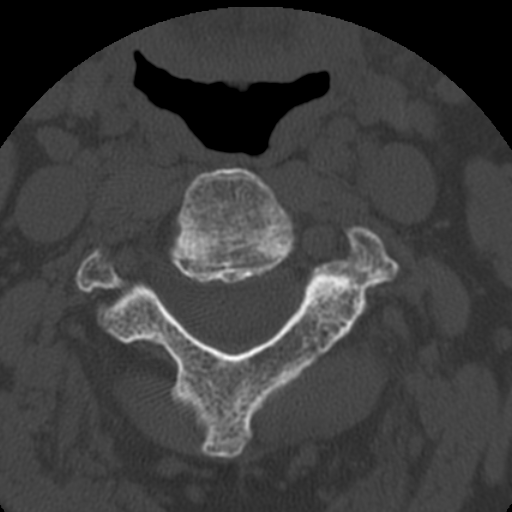
[im 245/280  bone]
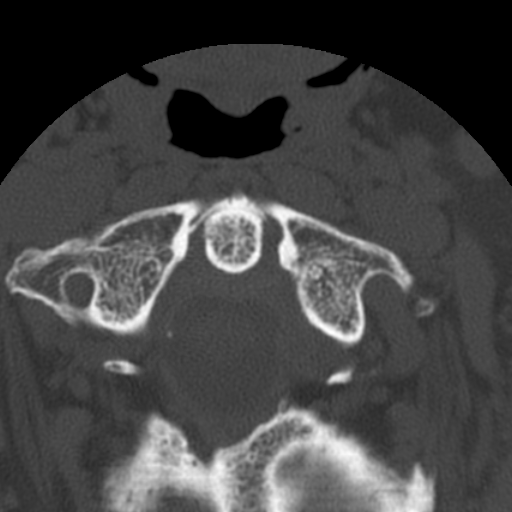

[15 of 33 positions shown; findings below may reference images not displayed]

FINDINGS: 2 mm anterior slip C2 on C3.  3 mm retrolisthesis C3 on
C4.  Negative for fracture.  No mass lesion.  No sclerotic or lytic
bone lesions are identified.

Advanced apical emphysema in the lungs bilaterally.

C2-3:  Mild disc degeneration.  2 mm of anterior slip.  Advanced
facet degeneration on the left with mild left foraminal narrowing.

C3-4:  3 mm of retrolisthesis.  Advanced disc degeneration and
spondylosis.  Mild facet degeneration.  Moderate spinal stenosis.

C4-5:  Mild to moderate central and right-sided disc protrusion.
There is facet degeneration and uncovertebral spurring.  There is
right foraminal encroachment due to spurring.

C5-6:  Advanced disc degeneration and spondylosis.  Diffuse
uncovertebral spurring with mild spinal stenosis.  There is facet
degeneration bilaterally.

C6-7:  Mild disc degeneration and spondylosis.

C7-T1:  Negative
IMPRESSION: At C3-4 there is advanced disc degeneration with spondylosis and
central disc material causing moderate spinal stenosis.

C4-5 central disc protrusion and spondylosis contributing to mild
spinal stenosis and moderate right foraminal encroachment.

Disc degeneration and spondylosis causing mild spinal stenosis at
C5-6.

## 2012-02-27 ENCOUNTER — Ambulatory Visit (INDEPENDENT_AMBULATORY_CARE_PROVIDER_SITE_OTHER): Payer: Medicare Other | Admitting: Cardiology

## 2012-02-27 ENCOUNTER — Encounter: Payer: Self-pay | Admitting: Cardiology

## 2012-02-27 VITALS — BP 120/70 | HR 66 | Ht 75.0 in | Wt 202.0 lb

## 2012-02-27 DIAGNOSIS — Z953 Presence of xenogenic heart valve: Secondary | ICD-10-CM

## 2012-02-27 DIAGNOSIS — D649 Anemia, unspecified: Secondary | ICD-10-CM

## 2012-02-27 DIAGNOSIS — Z952 Presence of prosthetic heart valve: Secondary | ICD-10-CM

## 2012-02-27 DIAGNOSIS — Z954 Presence of other heart-valve replacement: Secondary | ICD-10-CM

## 2012-02-27 NOTE — Assessment & Plan Note (Addendum)
The patient has a past history of mild anemia and has been on long-term iron and vitamin therapy from his primary care physician.  Is on this long before he had to have his heart valve replaced.  At his last visit his hemoglobin had improved significantly and was almost back to normal.  We will plan to recheck a CBC at his next visit.  His last hemoglobin was 12.7

## 2012-02-27 NOTE — Progress Notes (Signed)
Nicholas Walls Date of Birth:  11-08-1935 [facility] [address] [place], [place]  [address] [phone]  Fax   [phone]  HPI: This pleasant 76 year old gentleman is seen for a scheduled followup office visit.  He has a past history of severe aortic stenosis.  He underwent aortic valve replacement with a pericardial tissue 10/13/11 by Dr. Tyrone Sage.  Patient is nonverbal well postoperatively.  He recently completed his outpatient cardiac rehabilitation program in which he did well.  Is not having any chest pain or shortness of breath.  He notes occasional brief dizziness if he changes positions quickly.  He has had a good appetite although his weight is down 2 pounds since last visit.  He has returned to work although he is avoiding any heavy strenuous manual labor such as lifting heavy machines.  Current Outpatient Prescriptions  Medication Sig Dispense Refill  . aspirin EC 325 MG EC tablet Take 1 tablet (325 mg total) by mouth daily.  30 tablet    . ferrous fumarate-iron polysaccharide complex (TANDEM) 162-115.2 MG CAPS Take 1 capsule by mouth daily with breakfast.        . fish oil-omega-3 fatty acids 1000 MG capsule Take 3 g by mouth daily.       . metoprolol tartrate (LOPRESSOR) 25 MG tablet TAKE 1 TABLET BY MOUTH TWICE DAILY  60 tablet  11  . ofloxacin (FLOXIN) 0.3 % otic solution as needed.      . simvastatin (ZOCOR) 20 MG tablet Take 20 mg by mouth at bedtime.          Allergies  Allergen Reactions  . Other     Ultram-seeing things    Patient Active Problem List  Diagnosis  . Osteoarthritis  . Sleep apnea  . S/P aortic valve replacement with bioprosthetic valve  . Pure hypercholesterolemia  . Anemia  . Dizziness - light-headed    History  Smoking status  . Former Smoker  . Types: Cigarettes  . Quit date: 09/01/1981  Smokeless tobacco  . Never Used    History  Alcohol Use  . 1.2 oz/week  . 2 Cans of beer per week    weekly     Family History  Problem Relation Age of Onset  . Cancer Mother     throat  . Heart failure Father   . Cancer Father     Lung Cancer  . Hypertension Sister     Review of Systems: The patient denies any heat or cold intolerance.  No weight gain or weight loss.  The patient denies headaches or blurry vision.  There is no cough or sputum production.  The patient denies dizziness.  There is no hematuria or hematochezia.  The patient denies any muscle aches or arthritis.  The patient denies any rash.  The patient denies frequent falling or instability.  There is no history of depression or anxiety.  All other systems were reviewed and are negative.   Physical Exam: Filed Vitals:   02/27/12 1609  BP: 120/70  Pulse: 66   the general appearance reveals a well-developed well-nourished gentleman in no distress.Pupils equal and reactive.   Extraocular Movements are full.  There is no scleral icterus.  The mouth and pharynx are normal.  The neck is supple.  The carotids reveal no bruits.  The jugular venous pressure is normal.  The thyroid is not enlarged.  There is no lymphadenopathy.  The chest is clear to percussion and auscultation. There are no rales  or rhonchi. Expansion of the chest is symmetrical.  Heart reveals a soft systolic ejection murmur across the valve.  No diastolic murmur.  The chest wall incision is well-healed.The abdomen is soft and nontender. Bowel sounds are normal. The liver and spleen are not enlarged. There Are no abdominal masses. There are no bruits.  The pedal pulses are good.  There is no phlebitis or edema.  There is no cyanosis or clubbing. Strength is normal and symmetrical in all extremities.  There is no lateralizing weakness.  There are no sensory deficits.  The skin is warm and dry.  There is no rash.     Assessment / Plan: Continue same medication.  He is asking whether he can take some Aleve which she has used successfully in the past without side  effects and this would be okay if he needs it for some musculoskeletal arm pain. Recheck in 4 months for followup office visit EKG and CBC

## 2012-02-27 NOTE — Assessment & Plan Note (Signed)
The patient has not been experiencing any exertional chest pain or exertional dyspnea.  No palpitations.

## 2012-02-27 NOTE — Patient Instructions (Addendum)
Your physician recommends that you continue on your current medications as directed. Please refer to the Current Medication list given to you today. Your physician wants you to follow-up in: 4 months You will receive a reminder letter in the mail two months in advance. If you don't receive a letter, please call our office to schedule the follow-up appointment.  

## 2012-05-20 ENCOUNTER — Ambulatory Visit (INDEPENDENT_AMBULATORY_CARE_PROVIDER_SITE_OTHER): Payer: Medicare Other | Admitting: Nurse Practitioner

## 2012-05-20 ENCOUNTER — Encounter: Payer: Self-pay | Admitting: Nurse Practitioner

## 2012-05-20 VITALS — BP 142/70 | HR 45 | Ht 75.0 in | Wt 206.4 lb

## 2012-05-20 DIAGNOSIS — R001 Bradycardia, unspecified: Secondary | ICD-10-CM

## 2012-05-20 DIAGNOSIS — I498 Other specified cardiac arrhythmias: Secondary | ICD-10-CM

## 2012-05-20 DIAGNOSIS — Z01810 Encounter for preprocedural cardiovascular examination: Secondary | ICD-10-CM

## 2012-05-20 MED ORDER — METOPROLOL TARTRATE 25 MG PO TABS: 12.5000 mg | ORAL_TABLET | Freq: Two times a day (BID) | ORAL | Status: DC

## 2012-05-20 NOTE — Progress Notes (Signed)
Nicholas Walls Date of Birth: 11-03-1935 Medical Record #[id number]  History of Present Illness: Nicholas Walls is seen today for a pre op clearance visit. He is seen for Dr. Patty Sermons. He has had AVR for severe AS back in February. He has done well since that time. His other issues include HLD. Has carpal tunnel and is wanting to have surgery with Dr. Teressa Senter. It is scheduled for the first of October.   He comes in today. He is here alone. He is doing well. No problems. No chest pain. Not lightheaded or dizzy. No passing out spells. Not short of breath. Feels good and is doing "everything I want to".   Current Outpatient Prescriptions on File Prior to Visit  Medication Sig Dispense Refill  . aspirin EC 325 MG EC tablet Take 1 tablet (325 mg total) by mouth daily.  30 tablet    . ferrous fumarate-iron polysaccharide complex (TANDEM) 162-115.2 MG CAPS Take 1 capsule by mouth daily with breakfast.        . fish oil-omega-3 fatty acids 1000 MG capsule Take 3 g by mouth daily.       [name] ofloxacin (FLOXIN) 0.3 % otic solution as needed.      . simvastatin (ZOCOR) 20 MG tablet Take 20 mg by mouth at bedtime.        [name] DISCONTD: metoprolol tartrate (LOPRESSOR) 25 MG tablet TAKE 1 TABLET BY MOUTH TWICE DAILY  60 tablet  11    Allergies  Allergen Reactions  . Other     Ultram-seeing things    Past Medical History  Diagnosis Date  . Hyperlipidemia   . Aortic stenosis, severe     s/p AVR with pericardial tissue valve Feb 2013 per Dr. Tyrone Sage  . Osteoarthritis      End-stage osteoarthritis--left knee  . History of hiatal hernia   . History of anemia of chronic disease   . Hyponatremia     Mild hyponatremia, allowed to self-correct, asymptomatic  . Sleep apnea      4 yrs ago     no cpap  . Aortic valve defect   . Hypertension   . Bradycardia by electrocardiogram     Past Surgical History  Procedure Date  . Cholecystectomy   . Total knee arthroplasty     bilateral  . Joint  replacement     bil knees  . Cervical disc surgery   . Cardiac catheterization   . Aortic valve replacement 10/13/2011    Procedure: AORTIC VALVE REPLACEMENT (AVR);  Surgeon: [name], MD;  Location: [facility] OR;  Service: Open Heart Surgery;  Laterality: N/A;    History  Smoking status  . Former Smoker  . Types: Cigarettes  . Quit date: 09/01/1981  Smokeless tobacco  . Never Used    History  Alcohol Use  . 1.2 oz/week  . 2 Cans of beer per week    weekly    Family History  Problem Relation Age of Onset  . Cancer Mother     throat  . Heart failure Father   . Cancer Father     Lung Cancer  . Hypertension Sister     Review of Systems: The review of systems is per the HPI.  All other systems were reviewed and are negative.  Physical Exam: BP 142/70  Pulse 45  Ht 6\' 3"  (1.905 m)  Wt 206 lb 6.4 oz (93.622 kg)  BMI 25.80 kg/m2 Patient is very pleasant and in no acute distress.  Skin is warm and dry. Color is normal.  HEENT is unremarkable. Normocephalic/atraumatic. PERRL. Sclera are nonicteric. Neck is supple. No masses. No JVD. Lungs are clear. Cardiac exam shows a regular rate and rhythm. It is slow. Rate of 42 by me. Abdomen is soft. Extremities are without edema. Gait and ROM are intact. No gross neurologic deficits noted.   LABORATORY DATA: EKG shows sinus bradycardia today. Rate of 45.   Lab Results  Component Value Date   WBC 3.7* 12/30/2011   HGB 12.7* 12/30/2011   HCT 38.0* 12/30/2011   PLT 147.0* 12/30/2011   GLUCOSE 107* 12/30/2011   ALT 20 10/09/2011   AST 20 10/09/2011   NA 141 12/30/2011   K 4.0 12/30/2011   CL 106 12/30/2011   CREATININE 1.1 12/30/2011   BUN 16 12/30/2011   CO2 28 12/30/2011   INR 1.64* 10/13/2011   HGBA1C 4.9 10/09/2011    Assessment / Plan:  1. Pre op exam - He is felt to be stable from our standpoint and may proceed with his carpal tunnel release.  2. Bradycardia - he is totally asymptomatic. Not dizzy or lightheaded. No syncope. I  don't think his heart rate should be this low.  I have cut his Metoprolol in half. I have asked him to monitor his BP and heart rate at home and will ask his daughter to help with this.   We will see him back in October for his recall visit. Overall he is doing well and felt to be stable from our standpoint. Patient is agreeable to this plan and will call if any problems develop in the interim.

## 2012-05-20 NOTE — Patient Instructions (Addendum)
I am going to cut your Metoprolol back to just a half of the 25 mg (12.5) two times a day. This should help your heart rate come up.   Try to check your blood pressure at home and write some readings down.   You may have your hand surgery  We will see you back in October for regular visit.  Call the [facility] office at [phone] if you have any questions, problems or concerns.

## 2012-05-25 ENCOUNTER — Other Ambulatory Visit: Payer: Self-pay | Admitting: Orthopedic Surgery

## 2012-05-26 ENCOUNTER — Encounter (HOSPITAL_BASED_OUTPATIENT_CLINIC_OR_DEPARTMENT_OTHER): Payer: Self-pay | Admitting: *Deleted

## 2012-05-26 NOTE — Progress Notes (Signed)
Pt had aortic valve replacement 2/13-did well-gor cardiac clearance Had ekg 9/13-they are in [place] now. Will need [employer]

## 2012-05-31 NOTE — H&P (Signed)
Nicholas Walls is an 76 y.o. male.   Chief Complaint: c/o chronic and progressive numbness and tingling of the right hand    HPI:  Nicholas Walls returns for evaluation of numbness in the right hand.  Nicholas Walls has noted numbness in his thumb, index and long finger when he drives. I sent him to see Wynona Luna for decompression of acute cervical stenosis in 2011.  He is doing much better with improved strength in his legs following surgery.  He still has residual impairment of proprioception.  Nicholas Walls also developed aortic stenosis and had a DynaGraft aortic valve replacement in February 2013 by Ofilia Neas.   Past Medical History  Diagnosis Date  . Hyperlipidemia   . Aortic stenosis, severe     s/p AVR with pericardial tissue valve Feb 2013 per Dr. Tyrone Sage  . Osteoarthritis      End-stage osteoarthritis--left knee  . History of hiatal hernia   . History of anemia of chronic disease   . Hyponatremia     Mild hyponatremia, allowed to self-correct, asymptomatic  . Aortic valve defect   . Hypertension   . Bradycardia by electrocardiogram   . Sleep apnea      4 yrs ago     no cpap  . Full dentures     Past Surgical History  Procedure Date  . Cholecystectomy   . Total knee arthroplasty     bilateral  . Joint replacement     bil knees  . Cervical disc surgery   . Cardiac catheterization   . Aortic valve replacement 10/13/2011    Procedure: AORTIC VALVE REPLACEMENT (AVR);  Surgeon: [name], MD;  Location: [facility] OR;  Service: Open Heart Surgery;  Laterality: N/A;    Family History  Problem Relation Age of Onset  . Cancer Mother     throat  . Heart failure Father   . Cancer Father     Lung Cancer  . Hypertension Sister    Social History:  reports that he quit smoking about 30 years ago. His smoking use included Cigarettes. He has never used smokeless tobacco. He reports that he drinks about 1.2 ounces of alcohol per week. He reports that he does not use illicit  drugs.  Allergies:  Allergies  Allergen Reactions  . Other     Ultram-seeing things    No prescriptions prior to admission    No results found for this or any previous visit (from the past 48 hour(s)).  No results found.   Pertinent items are noted in HPI.  Height 6\' 3"  (1.905 m), weight 92.987 kg (205 lb).  General appearance: alert Head: Normocephalic, without obvious abnormality Neck: supple, symmetrical, trachea midline Resp: clear to auscultation bilaterally Cardio: regular rate and rhythm GI: normal findings: bowel sounds normal Extremities: He has diminished sensibility in the median distribution of his right hand.  He has positive Tinel's sign.  His direct compression test was equivocal.  He does have a pattern of nocturnal numbness and numbness when he drives his car.    Electrodiagnostic studies of his hands:  He has very severe right carpal tunnel syndrome and mild left carpal tunnel syndrome.  He also has significant right ulnar entrapment neuropathy at the cubital tunnel.    Pulses: 2+ and symmetric Skin: normal Neurologic: Grossly normal    Assessment/Plan Impression:right CTS and ulnar nerve compression at the cubital tunnel  Plan:To the OR for right CTR and decompression ulnar nerve at the cubital tunnel.The procedure, risks,benefits  and post-op course were discussed with the patient at length and they were in agreement with the plan.   DASNOIT,Weslie Pretlow J 05/31/2012, 1:49 PM     H&P documentation: 06/01/2012  -History and Physical Reviewed  -Patient has been re-examined  -No change in the plan of care  Wyn Forster, MD

## 2012-06-01 ENCOUNTER — Encounter (HOSPITAL_BASED_OUTPATIENT_CLINIC_OR_DEPARTMENT_OTHER): Payer: Self-pay | Admitting: Certified Registered"

## 2012-06-01 ENCOUNTER — Ambulatory Visit (HOSPITAL_BASED_OUTPATIENT_CLINIC_OR_DEPARTMENT_OTHER): Payer: Medicare Other | Admitting: Certified Registered"

## 2012-06-01 ENCOUNTER — Encounter (HOSPITAL_BASED_OUTPATIENT_CLINIC_OR_DEPARTMENT_OTHER)
Admission: RE | Disposition: A | Discharge status: Home / Self Care | Payer: Self-pay | Source: Ambulatory Visit | Attending: Orthopedic Surgery

## 2012-06-01 ENCOUNTER — Ambulatory Visit (HOSPITAL_BASED_OUTPATIENT_CLINIC_OR_DEPARTMENT_OTHER)
Admission: RE | Admit: 2012-06-01 | Discharge: 2012-06-01 | Disposition: A | Discharge status: Home / Self Care | Payer: Medicare Other | Source: Ambulatory Visit | Attending: Orthopedic Surgery | Admitting: Orthopedic Surgery

## 2012-06-01 DIAGNOSIS — G5600 Carpal tunnel syndrome, unspecified upper limb: Secondary | ICD-10-CM | POA: Insufficient documentation

## 2012-06-01 DIAGNOSIS — G5620 Lesion of ulnar nerve, unspecified upper limb: Secondary | ICD-10-CM | POA: Insufficient documentation

## 2012-06-01 DIAGNOSIS — I10 Essential (primary) hypertension: Secondary | ICD-10-CM | POA: Insufficient documentation

## 2012-06-01 HISTORY — PX: CARPAL TUNNEL RELEASE: SHX101

## 2012-06-01 HISTORY — DX: Presence of dental prosthetic device (complete) (partial): Z97.2

## 2012-06-01 HISTORY — DX: Complete loss of teeth, unspecified cause, unspecified class: K08.109

## 2012-06-01 HISTORY — PX: ULNAR NERVE TRANSPOSITION: SHX2595

## 2012-06-01 LAB — POCT I-STAT, CHEM 8
BUN: 17 mg/dL (ref 6–23)
Chloride: 104 mEq/L (ref 96–112)
Creatinine, Ser: 1 mg/dL (ref 0.50–1.35)
Glucose, Bld: 97 mg/dL (ref 70–99)
Hemoglobin: 14.3 g/dL (ref 13.0–17.0)
Potassium: 4.1 mEq/L (ref 3.5–5.1)
Sodium: 142 mEq/L (ref 135–145)

## 2012-06-01 SURGERY — CARPAL TUNNEL RELEASE
Anesthesia: General | Site: Wrist | Laterality: Right | Wound class: Clean

## 2012-06-01 MED ORDER — ONDANSETRON HCL 4 MG/2ML IJ SOLN
INTRAMUSCULAR | Status: DC | PRN
  Administered 2012-06-01: 4 mg via INTRAVENOUS

## 2012-06-01 MED ORDER — ONDANSETRON HCL 4 MG/2ML IJ SOLN: 4.0000 mg | Freq: Once | INTRAMUSCULAR | Status: DC | PRN

## 2012-06-01 MED ORDER — LIDOCAINE HCL (CARDIAC) 20 MG/ML IV SOLN
INTRAVENOUS | Status: DC | PRN
  Administered 2012-06-01: 100 mg via INTRAVENOUS

## 2012-06-01 MED ORDER — FENTANYL CITRATE 0.05 MG/ML IJ SOLN
INTRAMUSCULAR | Status: DC | PRN
  Administered 2012-06-01: 12.5 ug via INTRAVENOUS
  Administered 2012-06-01: 100 ug via INTRAVENOUS
  Administered 2012-06-01: 12.5 ug via INTRAVENOUS

## 2012-06-01 MED ORDER — PROPOFOL 10 MG/ML IV BOLUS
INTRAVENOUS | Status: DC | PRN
  Administered 2012-06-01: 150 mg via INTRAVENOUS

## 2012-06-01 MED ORDER — HYDROMORPHONE HCL PF 1 MG/ML IJ SOLN: 0.2500 mg | INTRAMUSCULAR | Status: DC | PRN

## 2012-06-01 MED ORDER — DEXAMETHASONE SODIUM PHOSPHATE 4 MG/ML IJ SOLN
INTRAMUSCULAR | Status: DC | PRN
  Administered 2012-06-01: 8 mg via INTRAVENOUS

## 2012-06-01 MED ORDER — OXYCODONE-ACETAMINOPHEN 5-325 MG PO TABS
ORAL_TABLET | ORAL | Status: DC
Start: 2012-06-01 — End: 2012-07-01

## 2012-06-01 MED ORDER — LIDOCAINE HCL 2 % IJ SOLN
INTRAMUSCULAR | Status: DC | PRN
  Administered 2012-06-01: 4 mL

## 2012-06-01 MED ORDER — MEPERIDINE HCL 25 MG/ML IJ SOLN: 6.2500 mg | INTRAMUSCULAR | Status: DC | PRN

## 2012-06-01 MED ORDER — OXYCODONE HCL 5 MG/5ML PO SOLN: 5.0000 mg | Freq: Once | ORAL | Status: DC | PRN

## 2012-06-01 MED ORDER — LACTATED RINGERS IV SOLN
INTRAVENOUS | Status: DC
  Administered 2012-06-01: 09:00:00 via INTRAVENOUS

## 2012-06-01 MED ORDER — OXYCODONE HCL 5 MG PO TABS: 5.0000 mg | ORAL_TABLET | Freq: Once | ORAL | Status: DC | PRN

## 2012-06-01 MED ORDER — CHLORHEXIDINE GLUCONATE 4 % EX LIQD: 60.0000 mL | Freq: Once | CUTANEOUS | Status: DC

## 2012-06-01 MED ORDER — VANCOMYCIN HCL IN DEXTROSE 1-5 GM/200ML-% IV SOLN
1000.0000 mg | Freq: Once | INTRAVENOUS | Status: AC
  Administered 2012-06-01: 1000 mg via INTRAVENOUS

## 2012-06-01 SURGICAL SUPPLY — 50 items
BANDAGE ADHESIVE 1X3 (GAUZE/BANDAGES/DRESSINGS) IMPLANT
BANDAGE ELASTIC 3 VELCRO ST LF (GAUZE/BANDAGES/DRESSINGS) ×3 IMPLANT
BANDAGE ELASTIC 4 VELCRO ST LF (GAUZE/BANDAGES/DRESSINGS) IMPLANT
BLADE MINI RND TIP GREEN BEAV (BLADE) IMPLANT
BLADE SURG 15 STRL LF DISP TIS (BLADE) ×2 IMPLANT
BLADE SURG 15 STRL SS (BLADE) ×3
BNDG CMPR 9X4 STRL LF SNTH (GAUZE/BANDAGES/DRESSINGS) ×2
BNDG ESMARK 4X9 LF (GAUZE/BANDAGES/DRESSINGS) ×1 IMPLANT
BRUSH SCRUB EZ PLAIN DRY (MISCELLANEOUS) ×3 IMPLANT
CLOTH BEACON ORANGE TIMEOUT ST (SAFETY) ×3 IMPLANT
CORDS BIPOLAR (ELECTRODE) ×3 IMPLANT
COVER MAYO STAND STRL (DRAPES) ×3 IMPLANT
COVER TABLE BACK 60X90 (DRAPES) ×3 IMPLANT
CUFF TOURNIQUET SINGLE 18IN (TOURNIQUET CUFF) ×1 IMPLANT
DECANTER SPIKE VIAL GLASS SM (MISCELLANEOUS) ×1 IMPLANT
DRAPE EXTREMITY T 121X128X90 (DRAPE) ×3 IMPLANT
DRAPE SURG 17X23 STRL (DRAPES) ×3 IMPLANT
DRSG TEGADERM 4X4.75 (GAUZE/BANDAGES/DRESSINGS) ×4 IMPLANT
GAUZE SPONGE 4X4 12PLY STRL LF (GAUZE/BANDAGES/DRESSINGS) ×3 IMPLANT
GLOVE BIO SURGEON STRL SZ7 (GLOVE) ×1 IMPLANT
GLOVE BIOGEL M STRL SZ7.5 (GLOVE) ×3 IMPLANT
GLOVE BIOGEL PI IND STRL 7.0 (GLOVE) IMPLANT
GLOVE BIOGEL PI INDICATOR 7.0 (GLOVE) ×2
GLOVE ORTHO TXT STRL SZ7.5 (GLOVE) ×3 IMPLANT
GOWN PREVENTION PLUS XLARGE (GOWN DISPOSABLE) ×3 IMPLANT
GOWN PREVENTION PLUS XXLARGE (GOWN DISPOSABLE) ×6 IMPLANT
LOOP VESSEL MAXI BLUE (MISCELLANEOUS) IMPLANT
NEEDLE 27GAX1X1/2 (NEEDLE) ×1 IMPLANT
PACK BASIN DAY SURGERY FS (CUSTOM PROCEDURE TRAY) ×3 IMPLANT
PAD CAST 3X4 CTTN HI CHSV (CAST SUPPLIES) ×2 IMPLANT
PADDING CAST ABS 4INX4YD NS (CAST SUPPLIES) ×1
PADDING CAST ABS COTTON 4X4 ST (CAST SUPPLIES) ×2 IMPLANT
PADDING CAST COTTON 3X4 STRL (CAST SUPPLIES) ×3
SLEEVE SCD COMPRESS KNEE MED (MISCELLANEOUS) IMPLANT
SPLINT PLASTER CAST XFAST 3X15 (CAST SUPPLIES) ×10 IMPLANT
SPLINT PLASTER XTRA FASTSET 3X (CAST SUPPLIES) ×5
SPONGE GAUZE 4X4 12PLY (GAUZE/BANDAGES/DRESSINGS) ×3 IMPLANT
STOCKINETTE 4X48 STRL (DRAPES) ×3 IMPLANT
STRIP CLOSURE SKIN 1/2X4 (GAUZE/BANDAGES/DRESSINGS) ×3 IMPLANT
SUT PROLENE 3 0 PS 2 (SUTURE) ×3 IMPLANT
SUT VIC AB 3-0 X1 27 (SUTURE) IMPLANT
SUT VIC AB 4-0 P-3 18XBRD (SUTURE) IMPLANT
SUT VIC AB 4-0 P3 18 (SUTURE)
SYR 3ML 23GX1 SAFETY (SYRINGE) IMPLANT
SYR BULB 3OZ (MISCELLANEOUS) IMPLANT
SYR CONTROL 10ML LL (SYRINGE) ×1 IMPLANT
TOWEL OR 17X24 6PK STRL BLUE (TOWEL DISPOSABLE) ×6 IMPLANT
TRAY DSU PREP LF (CUSTOM PROCEDURE TRAY) ×3 IMPLANT
UNDERPAD 30X30 INCONTINENT (UNDERPADS AND DIAPERS) ×3 IMPLANT
WATER STERILE IRR 1000ML POUR (IV SOLUTION) ×2 IMPLANT

## 2012-06-01 NOTE — Op Note (Signed)
[id number] 

## 2012-06-01 NOTE — Anesthesia Procedure Notes (Signed)
Procedure Name: LMA Insertion Date/Time: 06/01/2012 9:13 AM Performed by: Verlan Friends Pre-anesthesia Checklist: Patient identified, Emergency Drugs available, Suction available, Patient being monitored and Timeout performed Patient Re-evaluated:Patient Re-evaluated prior to inductionOxygen Delivery Method: Circle System Utilized Preoxygenation: Pre-oxygenation with 100% oxygen Intubation Type: IV induction Ventilation: Mask ventilation without difficulty LMA: LMA inserted LMA Size: 5.0 Number of attempts: 1 Placement Confirmation: positive ETCO2 Tube secured with: Tape Dental Injury: Teeth and Oropharynx as per pre-operative assessment  Comments: Dentures removed.

## 2012-06-01 NOTE — Anesthesia Preprocedure Evaluation (Signed)
Anesthesia Evaluation  Patient identified by MRN, date of birth, ID band Patient awake    Reviewed: Allergy & Precautions, H&P , NPO status , Patient's Chart, lab work & pertinent test results  Airway Mallampati: I TM Distance: >3 FB Neck ROM: Full    Dental   Pulmonary          Cardiovascular hypertension, Pt. on medications  S/P AVR 10/2011 with good results. Cleared cardiologically.   Neuro/Psych    GI/Hepatic   Endo/Other    Renal/GU      Musculoskeletal   Abdominal   Peds  Hematology   Anesthesia Other Findings   Reproductive/Obstetrics                           Anesthesia Physical Anesthesia Plan  ASA: III  Anesthesia Plan: General   Post-op Pain Management:    Induction: Intravenous  Airway Management Planned: LMA  Additional Equipment:   Intra-op Plan:   Post-operative Plan: Extubation in OR  Informed Consent: I have reviewed the patients History and Physical, chart, labs and discussed the procedure including the risks, benefits and alternatives for the proposed anesthesia with the patient or authorized representative who has indicated his/her understanding and acceptance.     Plan Discussed with: CRNA and Surgeon  Anesthesia Plan Comments:         Anesthesia Quick Evaluation

## 2012-06-01 NOTE — Brief Op Note (Signed)
06/01/2012  9:46 AM  PATIENT:  [name]  76 y.o. male  PRE-OPERATIVE DIAGNOSIS:  severe right carpal tunnel and right ulnar nerve compression  POST-OPERATIVE DIAGNOSIS:  severe right carpal tunnel syndrome and right ulnar nerve compression  PROCEDURE:  Procedure(s) (LRB) with comments: CARPAL TUNNEL RELEASE (Right) ULNAR NERVE DECOMPRESSION/TRANSPOSITION (Right) - right ulnar nerve decompression  SURGEON:  Surgeon(s) and Role:    * Wyn Forster., MD - Primary  PHYSICIAN ASSISTANT:   ASSISTANTS:Noami Bove Dasnoit,P.A-C   ANESTHESIA:   general  EBL:  Total I/O In: 500 [I.V.:500] Out: -   BLOOD ADMINISTERED:none  DRAINS: none   LOCAL MEDICATIONS USED:  LIDOCAINE   SPECIMEN:  No Specimen  DISPOSITION OF SPECIMEN:  N/A  COUNTS:  YES  TOURNIQUET:  * Missing tourniquet times found for documented tourniquets in log:  [id number] *  DICTATION: .Other Dictation: Dictation Number [id number]  PLAN OF CARE: Discharge to home after PACU  PATIENT DISPOSITION:  PACU - hemodynamically stable.

## 2012-06-01 NOTE — Transfer of Care (Signed)
Immediate Anesthesia Transfer of Care Note  Patient: Nicholas Walls  Procedure(s) Performed: Procedure(s) (LRB) with comments: CARPAL TUNNEL RELEASE (Right) ULNAR NERVE DECOMPRESSION/TRANSPOSITION (Right) - right ulnar nerve decompression  Patient Location: PACU  Anesthesia Type: General  Level of Consciousness: awake, alert , oriented and patient cooperative  Airway & Oxygen Therapy: Patient Spontanous Breathing and Patient connected to face mask oxygen  Post-op Assessment: Report given to PACU RN and Post -op Vital signs reviewed and stable  Post vital signs: Reviewed and stable  Complications: No apparent anesthesia complications

## 2012-06-02 ENCOUNTER — Encounter (HOSPITAL_BASED_OUTPATIENT_CLINIC_OR_DEPARTMENT_OTHER): Payer: Self-pay | Admitting: Orthopedic Surgery

## 2012-06-02 NOTE — Anesthesia Postprocedure Evaluation (Signed)
Anesthesia Post Note  Patient: Nicholas Walls  Procedure(s) Performed: Procedure(s) (LRB): CARPAL TUNNEL RELEASE (Right) ULNAR NERVE DECOMPRESSION/TRANSPOSITION (Right)  Anesthesia type: general  Patient location: PACU  Post pain: Pain level controlled  Post assessment: Patient's Cardiovascular Status Stable  Last Vitals:  Filed Vitals:   06/01/12 1030  BP: 138/68  Pulse: 62  Temp: 36.4 C  Resp: 16    Post vital signs: Reviewed and stable  Level of consciousness: sedated  Complications: No apparent anesthesia complications

## 2012-06-02 NOTE — Op Note (Signed)
NAME:  Nicholas Walls, Nicholas Walls NO.:  [id number]  MEDICAL RECORD NO.:  [id number]  LOCATION:                               FACILITY:  [facility]  PHYSICIAN:  Katy Fitch. Shelisha Gautier, M.D. DATE OF BIRTH:  09-Oct-1935  DATE OF PROCEDURE:  06/01/2012 DATE OF DISCHARGE:  06/01/2012                              OPERATIVE REPORT   PREOPERATIVE DIAGNOSES:  Entrapment neuropathy, median nerve, right carpal tunnel and entrapment neuropathy, right ulnar nerve at cubital tunnel.  POSTOPERATIVE DIAGNOSES: 1. Entrapment neuropathy, median nerve, right carpal tunnel. 2. 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  OPERATION: 1. Release of right transverse carpal ligament at wrist. 2. Decompression of right ulnar nerve at cubital tunnel with resection     of anconeus epitrochlearis muscle and in situ decompression of     ulnar nerve.  OPERATING SURGEON:  Katy Fitch. Mihail Prettyman, MD  ASSISTANT:  Marveen Reeks Dasnoit, PA-C  ANESTHESIA:  General by LMA.  SUPERVISING ANESTHESIOLOGIST:  Kaylyn Layer. Michelle Piper, MD  INDICATION:  Nicholas Walls is a 76 year old gentleman well acquainted with our practice.  In 2011, I worked him up for hand numbness and identified profound cervical stenosis and cervical myelopathy.  We referred him for Spine Surgery consult with Dr. Alveda Reasons.  He subsequently underwent a posterior cervical laminectomy and decompression of his cervical cord.  Nicholas Walls has residual myelopathy symptoms as well as a peripheral neuropathy.  He is now referred for evaluation and management of persistent hand numbness with a clinical diagnosis of significant carpal tunnel syndrome.  Clinical examination revealed signs of entrapped neuropathy at the right wrist and at the right elbow.  We advised that it would be in his best interest to proceed with release of his right transverse carpal ligament and decompression of his right ulnar nerve at the  cubital tunnel with possible transposition.  He understands with his background myelopathy and possible peripheral neuropathy.  We cannot guarantee at age 3, complete resolution of his symptoms; however, past experience in similar circumstance has shown that the majority of patients do have significant improvement in their numbness following decompression.  Preoperatively, he was reminded the potential risks and benefits of surgery.  Questions were invited and answered in detail.  PROCEDURE:  Nicholas Walls was brought to room #6 of the [facility] and placed in supine position on the operating table.  Following the induction of general anesthesia by LMA technique, the right arm and hand were prepped with Betadine soap and solution, and sterilely draped.  A pneumatic tourniquet was applied to the proximal right brachium.  Due to a history of an aortic valve prosthesis being placed by Dr. Nydia Bouton and history of MRSA infection, Nicholas Walls was provided 1 g of vancomycin as an IV prophylactic antibiotic beginning in the holding area preoperatively.  In room #6 under Dr. Deirdre Priest direct supervision, spinal anesthesia by LMA technique was induced followed by routine Betadine scrub and paint of the right upper extremity.  Following exsanguination of right arm with an Esmarch bandage, the arterial tourniquet was inflated to 220 mmHg.  Procedure commenced with a routine surgical time-out.  A short  incision was then fashioned in line of the ring finger and the palm. Subcutaneous tissues were carefully divided revealing the palmar fascia. This was split longitudinally to reveal the common sensory branch of the median nerve and superficial palmar arch.  The carpal canal was surrounded with Penfield 4 elevator followed by use of scissors to release the ulnar bursa along its ulnar aspect extending into the distal forearm.  The volar forearm fascia was likewise released with  scissors.  This widely opened the carpal canal.  The ulnar bursa was noted to be quite fibrotic.  Bleeding points along the margin of the released ligament and in the subdermal plexus of the skin were electrocauterized with bipolar current.  The wound was then repaired with intradermal 3-0 Prolene suture.  Attention was then directed to the medial right elbow.  A 3-cm incision was fashioned paralleling the path of the ulnar nerve posterior to the epicondyle.  Subcutaneous tissues were carefully divided taking care to identify and gently protect the posterior branch of the medial antebrachial cutaneous sensory nerves.  The ulnar nerve was identified by palpation.  There was a large anconeus epitrochlearis muscle obscuring the nerve at the medial epicondyle.  The anconeus epitrochlearis was released along its posterior aspect.  The ulnar nerve was then identified by release of the brachial fascia.  The fascia was released distally with release of Osborne band in the arcuate ligament as well as the fascia at the head of the flexor carpi ulnaris. The nerve was decompressed 6 cm above the elbow and at least 6 cm distal to the epicondyle.  The primary air compression was deep to the anconeus epitrochlearis muscle at the arcuate ligament.  The nerve was noted to be stable through a range of 0-135 degrees of elbow flexion.  Bleeding points were electrocauterized with bipolar current followed by layered closure of the wound with subcutaneous 4-0 Vicryl and intradermal 3-0 Prolene suture.  The elbow wound was dressed with Steri-Strips, sterile gauze, and a Tegaderm followed by Ace wrap.  The hand was placed in a compressive dressing with Steri-Strips, sterile gauze, sterile Webril, and a volar plaster splint maintaining the wrist in 15 degrees of dorsiflexion.  There were no apparent complications.     Katy Fitch Anuj Summons, M.D.     RVS/MEDQ  D:  06/01/2012  T:  06/02/2012  Job:   [id number]

## 2012-06-24 ENCOUNTER — Ambulatory Visit: Payer: Medicare Other | Admitting: Cardiology

## 2012-07-01 ENCOUNTER — Ambulatory Visit (INDEPENDENT_AMBULATORY_CARE_PROVIDER_SITE_OTHER): Payer: Medicare Other | Admitting: Cardiology

## 2012-07-01 ENCOUNTER — Encounter: Payer: Self-pay | Admitting: Cardiology

## 2012-07-01 VITALS — BP 128/64 | HR 57 | Resp 18 | Ht 74.0 in | Wt 216.4 lb

## 2012-07-01 DIAGNOSIS — Z953 Presence of xenogenic heart valve: Secondary | ICD-10-CM

## 2012-07-01 DIAGNOSIS — Z952 Presence of prosthetic heart valve: Secondary | ICD-10-CM

## 2012-07-01 DIAGNOSIS — Z954 Presence of other heart-valve replacement: Secondary | ICD-10-CM

## 2012-07-01 DIAGNOSIS — E7800 Pure hypercholesterolemia, unspecified: Secondary | ICD-10-CM

## 2012-07-01 DIAGNOSIS — D649 Anemia, unspecified: Secondary | ICD-10-CM

## 2012-07-01 NOTE — Addendum Note (Signed)
Addended by: Regis Bill B on: 07/01/2012 11:22 AM   Modules accepted: Orders

## 2012-07-01 NOTE — Assessment & Plan Note (Signed)
No chest pain or shortness of breath.  No palpitations. 

## 2012-07-01 NOTE — Assessment & Plan Note (Signed)
The patient is not having any side effects from his simvastatin.  We will plan to recheck fasting labs at his next visit

## 2012-07-01 NOTE — Patient Instructions (Addendum)
Your physician recommends that you continue on your current medications as directed. Please refer to the Current Medication list given to you today.  Your physician recommends that you schedule a follow-up appointment in: 4 months with fasting labs (lp/bmet/hfp/cbc) ekg

## 2012-07-01 NOTE — Progress Notes (Signed)
Nicholas Walls Date of Birth:  October 13, 1935 [facility] [address] [place], [place]  [address] [phone]  Fax   [phone]  HPI: This pleasant 76 year old gentleman is seen for a scheduled followup office visit. He has a past history of severe aortic stenosis. He underwent aortic valve replacement with a pericardial tissue 10/13/11 by Dr. Tyrone Sage.  He  completed his outpatient cardiac rehabilitation program in which he did well.  Since we last saw the patient he has had successful surgery for carpal tunnel syndrome of his right hand.  [name] and is having no symptoms from his hand or from his heart.  He has not been experiencing any dizziness since his beta blocker was cut back on his last visit.  Current Outpatient Prescriptions  Medication Sig Dispense Refill  . aspirin EC 325 MG EC tablet Take 1 tablet (325 mg total) by mouth daily.  30 tablet    . ferrous fumarate-iron polysaccharide complex (TANDEM) 162-115.2 MG CAPS Take 1 capsule by mouth daily with breakfast.        . fish oil-omega-3 fatty acids 1000 MG capsule Take 3 g by mouth daily.       . metoprolol tartrate (LOPRESSOR) 25 MG tablet Take 0.5 tablets (12.5 mg total) by mouth 2 (two) times daily.  60 tablet  11  . ofloxacin (FLOXIN) 0.3 % otic solution as needed.      . simvastatin (ZOCOR) 20 MG tablet Take 20 mg by mouth at bedtime.        [name] oxyCODONE-acetaminophen (PERCOCET/ROXICET) 5-325 MG per tablet 1 or 2 tabs every 4 hours as needed for pain  24 tablet  0    Allergies  Allergen Reactions  . Other     Ultram-seeing things    Patient Active Problem List  Diagnosis  . Osteoarthritis  . Sleep apnea  . S/P aortic valve replacement with bioprosthetic valve  . Pure hypercholesterolemia  . Anemia  . Dizziness - light-headed    History  Smoking status  . Former Smoker  . Types: Cigarettes  . Quit date: 09/01/1981  Smokeless tobacco  . Never Used     History  Alcohol Use  . 1.2 oz/week  . 2 Cans of beer per week    weekly    Family History  Problem Relation Age of Onset  . Cancer Mother     throat  . Heart failure Father   . Cancer Father     Lung Cancer  . Hypertension Sister     Review of Systems: The patient denies any heat or cold intolerance.  No weight gain or weight loss.  The patient denies headaches or blurry vision.  There is no cough or sputum production.  The patient denies dizziness.  There is no hematuria or hematochezia.  The patient denies any muscle aches or arthritis.  The patient denies any rash.  The patient denies frequent falling or instability.  There is no history of depression or anxiety.  All other systems were reviewed and are negative.   Physical Exam: Filed Vitals:   07/01/12 0902  BP: 128/64  Pulse: 57  Resp: 18   General appearance reveals a well-developed well-nourished gentleman in no distress.The head and neck exam reveals pupils equal and reactive.  Extraocular movements are full.  There is no scleral icterus.  The mouth and pharynx are normal.  The neck is supple.  The carotids reveal no bruits.  The jugular  venous pressure is normal.  The  thyroid is not enlarged.  There is no lymphadenopathy.  The chest is clear to percussion and auscultation.  There are no rales or rhonchi.  Expansion of the chest is symmetrical.  The precordium is quiet.  The first heart sound is normal.  The second heart sound is physiologically split.  There is no  gallop rub or click.  There is a faint systolic ejection murmur at the base over his prosthetic aortic valve.  No diastolic murmur  There is no abnormal lift or heave.  The abdomen is soft and nontender.  The bowel sounds are normal.  The liver and spleen are not enlarged.  There are no abdominal masses.  There are no abdominal bruits.  Extremities reveal good pedal pulses.  There is no phlebitis or edema.  There is no cyanosis or clubbing.  Strength is normal  and symmetrical in all extremities.  There is no lateralizing weakness.  There are no sensory deficits.  The skin is warm and dry.  There is no rash.    Assessment / Plan: Continue same medication.  Recheck in 4 months for office visit EKG CBC lipid panel hepatic function panel and basal metabolic panel

## 2012-07-01 NOTE — Assessment & Plan Note (Signed)
No further symptoms since cutting back on his blood pressure medicines

## 2012-11-03 ENCOUNTER — Other Ambulatory Visit (INDEPENDENT_AMBULATORY_CARE_PROVIDER_SITE_OTHER): Payer: Medicare Other

## 2012-11-03 ENCOUNTER — Ambulatory Visit (INDEPENDENT_AMBULATORY_CARE_PROVIDER_SITE_OTHER): Payer: Medicare Other | Admitting: Cardiology

## 2012-11-03 ENCOUNTER — Encounter: Payer: Self-pay | Admitting: Cardiology

## 2012-11-03 VITALS — BP 134/86 | HR 49 | Ht 74.0 in | Wt 222.0 lb

## 2012-11-03 DIAGNOSIS — Z954 Presence of other heart-valve replacement: Secondary | ICD-10-CM

## 2012-11-03 DIAGNOSIS — E7800 Pure hypercholesterolemia, unspecified: Secondary | ICD-10-CM

## 2012-11-03 DIAGNOSIS — Z952 Presence of prosthetic heart valve: Secondary | ICD-10-CM

## 2012-11-03 DIAGNOSIS — D649 Anemia, unspecified: Secondary | ICD-10-CM

## 2012-11-03 DIAGNOSIS — Z953 Presence of xenogenic heart valve: Secondary | ICD-10-CM

## 2012-11-03 LAB — CBC WITH DIFFERENTIAL/PLATELET
Eosinophils Absolute: 0.3 10*3/uL (ref 0.0–0.7)
Eosinophils Relative: 7.7 % — ABNORMAL HIGH (ref 0.0–5.0)
Lymphocytes Relative: 27.4 % (ref 12.0–46.0)
MCV: 95 fl (ref 78.0–100.0)
Monocytes Absolute: 0.3 10*3/uL (ref 0.1–1.0)
Neutrophils Relative %: 54.1 % (ref 43.0–77.0)
Platelets: 124 10*3/uL — ABNORMAL LOW (ref 150.0–400.0)
WBC: 3.4 10*3/uL — ABNORMAL LOW (ref 4.5–10.5)

## 2012-11-03 LAB — LIPID PANEL
LDL Cholesterol: 86 mg/dL (ref 0–99)
Total CHOL/HDL Ratio: 4
VLDL: 24.8 mg/dL (ref 0.0–40.0)

## 2012-11-03 LAB — BASIC METABOLIC PANEL
GFR: 74.86 mL/min (ref >60.00)
Potassium: 4 mEq/L (ref 3.5–5.1)
Sodium: 140 mEq/L (ref 135–145)

## 2012-11-03 LAB — HEPATIC FUNCTION PANEL
AST: 18 U/L (ref 0–37)
Alkaline Phosphatase: 52 U/L (ref 39–117)
Total Bilirubin: 0.9 mg/dL (ref 0.3–1.2)

## 2012-11-03 NOTE — Progress Notes (Signed)
Nicholas Walls Date of Birth:  19-Mar-1936 Nicholas Walls [facility] [address] [place], [place]  [address] [phone]         Fax   [phone]  History of Present Illness: This pleasant 77 year old gentleman is seen for a scheduled followup office visit. He has a past history of severe aortic stenosis. He underwent aortic valve replacement with a pericardial tissue 10/13/11 by Dr. Tyrone Walls. He completed his outpatient cardiac rehabilitation program in which he did well. Since we last saw the patient he has had successful surgery for carpal tunnel syndrome of his right hand. [name] and is having no symptoms from his hand or from his heart. He has not been experiencing any dizziness since his beta blocker was cut back on his last visit.  His appetite is good and his weight is back up to where he wants it and his strength is back to where it had been preoperatively.   Current Outpatient Prescriptions  Medication Sig Dispense Refill  . aspirin EC 325 MG EC tablet Take 1 tablet (325 mg total) by mouth daily.  30 tablet    . ferrous fumarate-iron polysaccharide complex (TANDEM) 162-115.2 MG CAPS Take 1 capsule by mouth daily with breakfast.        . fish oil-omega-3 fatty acids 1000 MG capsule Take 3 g by mouth daily.       . metoprolol tartrate (LOPRESSOR) 25 MG tablet Take 0.5 tablets (12.5 mg total) by mouth 2 (two) times daily.  60 tablet  11  . ofloxacin (FLOXIN) 0.3 % otic solution as needed.      . simvastatin (ZOCOR) 20 MG tablet Take 20 mg by mouth at bedtime.         No current facility-administered medications for this visit.    Allergies  Allergen Reactions  . Other     Ultram-seeing things    Patient Active Problem List  Diagnosis  . Osteoarthritis  . Sleep apnea  . S/P aortic valve replacement with bioprosthetic valve  . Pure hypercholesterolemia  . Anemia  . Dizziness - light-headed    History  Smoking status  .  Former Smoker  . Types: Cigarettes  . Quit date: 09/01/1981  Smokeless tobacco  . Never Used    History  Alcohol Use  . 1.2 oz/week  . 2 Cans of beer per week    Comment: weekly    Family History  Problem Relation Age of Onset  . Cancer Mother     throat  . Heart failure Father   . Cancer Father     Lung Cancer  . Hypertension Sister     Review of Systems: Constitutional: no fever chills diaphoresis or fatigue or change in weight.  Head and neck: no hearing loss, no epistaxis, no photophobia or visual disturbance. Respiratory: No cough, shortness of breath or wheezing. Cardiovascular: No chest pain peripheral edema, palpitations. Gastrointestinal: No abdominal distention, no abdominal pain, no change in bowel habits hematochezia or melena. Genitourinary: No dysuria, no frequency, no urgency, no nocturia. Musculoskeletal:No arthralgias, no back pain, no gait disturbance or myalgias. Neurological: No dizziness, no headaches, no numbness, no seizures, no syncope, no weakness, no tremors. Hematologic: No lymphadenopathy, no easy bruising. Psychiatric: No confusion, no hallucinations, no sleep disturbance.    Physical Exam: Filed Vitals:   11/03/12 0844  BP: 134/86  Pulse: 49   the general appearance reveals a well-developed well-nourished gentleman in distress.The head and neck exam reveals  pupils equal and reactive.  Extraocular movements are full.  There is no scleral icterus.  The mouth and pharynx are normal.  The neck is supple.  The carotids reveal no bruits.  The jugular venous pressure is normal.  The  thyroid is not enlarged.  There is no lymphadenopathy.  The chest is clear to percussion and auscultation.  There are no rales or rhonchi.  Expansion of the chest is symmetrical.  The precordium is quiet.  The first heart sound is normal.  The second heart sound is physiologically split.  There is a soft flow murmur across the prosthetic aortic valve.  No diastolic  murmur.  There is no abnormal lift or heave.  The abdomen is soft and nontender.  The bowel sounds are normal.  The liver and spleen are not enlarged.  There are no abdominal masses.  There are no abdominal bruits.  Extremities reveal good pedal pulses.  There is no phlebitis or edema.  There is no cyanosis or clubbing.  Strength is normal and symmetrical in all extremities.  There is no lateralizing weakness.  There are no sensory deficits.  The skin is warm and dry.  There is no rash.  EKG shows sinus bradycardia at 49 per minute and otherwise normal   Assessment / Plan:  Continue on same medication.  He is tolerating current dose of beta blocker.  We will plan to recheck in 4 months for followup office visit.  Blood work today pending

## 2012-11-03 NOTE — Progress Notes (Signed)
Quick Note:  Please report to patient. The recent labs are stable. Continue same medication and careful diet. Hgb 14.3 so not anemic. Lipids very good. CSD ______

## 2012-11-03 NOTE — Patient Instructions (Addendum)
Your physician wants you to follow-up in: 4 months You will receive a reminder letter in the mail two months in advance. If you don't receive a letter, please call our office to schedule the follow-up appointment.     .Your physician recommends that you continue on your current medications as directed. Please refer to the Current Medication list given to you today.  

## 2012-11-03 NOTE — Assessment & Plan Note (Signed)
The patient is on simvastatin 20 mg daily.  Is not having side effects.  We are checking lab work today.

## 2012-11-03 NOTE — Assessment & Plan Note (Signed)
The patient has not been experiencing any chest pain or shortness of breath.  No dizziness or syncope.  No Palpitations.

## 2012-11-09 ENCOUNTER — Telehealth: Payer: Self-pay | Admitting: *Deleted

## 2012-11-09 NOTE — Telephone Encounter (Signed)
Advised patient of lab results  

## 2012-11-09 NOTE — Telephone Encounter (Signed)
Message copied by Burnell Blanks on Tue Nov 09, 2012  3:07 PM ------      Message from: Cassell Clement      Created: Wed Nov 03, 2012  6:09 PM       Please report to patient.  The recent labs are stable. Continue same medication and careful diet. Hgb 14.3 so not anemic. Lipids very good. CSD ------

## 2012-12-09 ENCOUNTER — Encounter: Payer: Self-pay | Admitting: *Deleted

## 2012-12-09 ENCOUNTER — Encounter: Payer: Self-pay | Admitting: Cardiology

## 2013-01-20 ENCOUNTER — Other Ambulatory Visit: Payer: Self-pay | Admitting: *Deleted

## 2013-01-20 DIAGNOSIS — Z952 Presence of prosthetic heart valve: Secondary | ICD-10-CM

## 2013-02-24 ENCOUNTER — Ambulatory Visit (HOSPITAL_COMMUNITY): Payer: Medicare Other | Attending: Cardiology

## 2013-02-24 DIAGNOSIS — Z954 Presence of other heart-valve replacement: Secondary | ICD-10-CM | POA: Insufficient documentation

## 2013-02-24 DIAGNOSIS — E785 Hyperlipidemia, unspecified: Secondary | ICD-10-CM | POA: Insufficient documentation

## 2013-02-24 DIAGNOSIS — I059 Rheumatic mitral valve disease, unspecified: Secondary | ICD-10-CM | POA: Insufficient documentation

## 2013-02-24 DIAGNOSIS — I079 Rheumatic tricuspid valve disease, unspecified: Secondary | ICD-10-CM | POA: Insufficient documentation

## 2013-02-24 DIAGNOSIS — Z87891 Personal history of nicotine dependence: Secondary | ICD-10-CM | POA: Insufficient documentation

## 2013-02-24 DIAGNOSIS — I379 Nonrheumatic pulmonary valve disorder, unspecified: Secondary | ICD-10-CM | POA: Insufficient documentation

## 2013-02-24 DIAGNOSIS — I359 Nonrheumatic aortic valve disorder, unspecified: Secondary | ICD-10-CM | POA: Insufficient documentation

## 2013-02-24 DIAGNOSIS — Z952 Presence of prosthetic heart valve: Secondary | ICD-10-CM

## 2013-02-24 NOTE — Progress Notes (Signed)
Echocardiogram performed.  

## 2013-02-28 ENCOUNTER — Telehealth: Payer: Self-pay | Admitting: *Deleted

## 2013-02-28 NOTE — Telephone Encounter (Signed)
Advised wife of echo

## 2013-02-28 NOTE — Telephone Encounter (Signed)
Message copied by Burnell Blanks on Mon Feb 28, 2013 12:09 PM ------      Message from: Cassell Clement      Created: Sun Feb 27, 2013  6:36 PM       Please report.  The prosthetic valve is functioning well. LV systolic function is good.CSD. ------

## 2013-03-03 ENCOUNTER — Encounter: Payer: Self-pay | Admitting: Cardiology

## 2013-03-03 ENCOUNTER — Ambulatory Visit (INDEPENDENT_AMBULATORY_CARE_PROVIDER_SITE_OTHER): Payer: Medicare Other | Admitting: Cardiology

## 2013-03-03 VITALS — BP 130/78 | HR 64 | Ht 74.0 in | Wt 220.0 lb

## 2013-03-03 DIAGNOSIS — Z954 Presence of other heart-valve replacement: Secondary | ICD-10-CM

## 2013-03-03 DIAGNOSIS — Z952 Presence of prosthetic heart valve: Secondary | ICD-10-CM

## 2013-03-03 DIAGNOSIS — E7800 Pure hypercholesterolemia, unspecified: Secondary | ICD-10-CM

## 2013-03-03 DIAGNOSIS — Z953 Presence of xenogenic heart valve: Secondary | ICD-10-CM

## 2013-03-03 NOTE — Patient Instructions (Addendum)
Your physician recommends that you continue on your current medications as directed. Please refer to the Current Medication list given to you today.  Your physician wants you to follow-up in: 4 months with fasting labs (lp/bmet/hfp) You will receive a reminder letter in the mail two months in advance. If you don't receive a letter, please call our office to schedule the follow-up appointment.  

## 2013-03-03 NOTE — Progress Notes (Signed)
[name] Date of Birth:  1935/12/08 [facility] [address] [place], [place]  [address] [phone]  Fax   [phone]  HPI: This pleasant 77 year old gentleman is seen for a scheduled followup office visit. He has a past history of severe aortic stenosis. He underwent aortic valve replacement with a pericardial tissue 10/13/11 by Dr. Tyrone Sage. He completed his outpatient cardiac rehabilitation program in which he did well. Since we last saw the patient he has had successful surgery for carpal tunnel syndrome of his right hand. [name] and is having no symptoms from his hand or from his heart.  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  Current Outpatient Prescriptions  Medication Sig Dispense Refill  . aspirin EC 325 MG EC tablet Take 1 tablet (325 mg total) by mouth daily.  30 tablet    . ferrous fumarate-iron polysaccharide complex (TANDEM) 162-115.2 MG CAPS Take 1 capsule by mouth daily with breakfast.        . fish oil-omega-3 fatty acids 1000 MG capsule Take 3 g by mouth daily.       . metoprolol tartrate (LOPRESSOR) 25 MG tablet Take 0.5 tablets (12.5 mg total) by mouth 2 (two) times daily.  60 tablet  11  . simvastatin (ZOCOR) 20 MG tablet Take 20 mg by mouth at bedtime.         No current facility-administered medications for this visit.    Allergies  Allergen Reactions  . Other     Ultram-seeing things    Patient Active Problem List   Diagnosis Date Noted  . Pure hypercholesterolemia 12/30/2011  . Anemia 12/30/2011  . Dizziness - light-headed 12/30/2011  . S/P aortic valve replacement with bioprosthetic valve 10/29/2011  . Sleep apnea 07/01/2011  . Osteoarthritis     History  Smoking status  . Former Smoker  . Types: Cigarettes  . Quit date: 09/01/1981  Smokeless tobacco  . Never Used    History  Alcohol Use  . 1.2 oz/week  . 2 Cans of beer per week    Comment: weekly     Family History  Problem Relation Age of Onset  . Cancer Mother     throat  . Heart failure Father   . Cancer Father     Lung Cancer  . Hypertension Sister     Review of Systems: The patient denies any heat or cold intolerance.  No weight gain or weight loss.  The patient denies headaches or blurry vision.  There is no cough or sputum production.  The patient denies dizziness.  There is no hematuria or hematochezia.  The patient denies any muscle aches or arthritis.  The patient denies any rash.  The patient denies frequent falling or instability.  There is no history of depression or anxiety.  All other systems were reviewed and are negative.   Physical Exam: Filed Vitals:   03/03/13 1105  BP: 130/78  Pulse: 64   the general appearance reveals a well-developed well-nourished gentleman in no distress.The head and neck exam reveals pupils equal and reactive.  Extraocular movements are full.  There is no scleral icterus.  The mouth and pharynx are normal.  The neck is supple.  The carotids reveal no bruits.  The jugular venous pressure is normal.  The  thyroid is not enlarged.  There is no lymphadenopathy.  The chest is clear to percussion and auscultation.  There are no rales or rhonchi.  Expansion of the chest is symmetrical.  The precordium is quiet.  The first heart sound is normal.  The second heart sound is physiologically split.  There is a soft systolic flow murmur across the prosthetic aortic valve.  No diastolic murmur. There is no abnormal lift or heave.  The abdomen is soft and nontender.  The bowel sounds are normal.  The liver and spleen are not enlarged.  There are no abdominal masses.  There are no abdominal bruits.  Extremities reveal good pedal pulses.  There is no phlebitis or edema.  There is no cyanosis or clubbing.  Strength is normal and symmetrical in all extremities.  There is no lateralizing weakness.  There are no sensory deficits.  The skin is warm and dry.  There is  no rash.      Assessment / Plan: Continue on same medication.  Continue full activity.  Recheck in 4 months for followup office visit lipid panel hepatic function panel and basal metabolic panel.

## 2013-03-03 NOTE — Assessment & Plan Note (Signed)
No chest pain.  No shortness of breath.  No significant dizzy spells or syncope since we cut back on his beta blocker

## 2013-03-03 NOTE — Assessment & Plan Note (Signed)
Patient has a history of hypercholesterolemia.  He is on simvastatin 20 mg daily.  We will check his lipids and his next visit in 4 months

## 2013-05-20 ENCOUNTER — Other Ambulatory Visit: Payer: Self-pay | Admitting: Cardiology

## 2013-07-01 ENCOUNTER — Ambulatory Visit (INDEPENDENT_AMBULATORY_CARE_PROVIDER_SITE_OTHER): Payer: Medicare Other | Admitting: Cardiology

## 2013-07-01 ENCOUNTER — Encounter: Payer: Self-pay | Admitting: Cardiology

## 2013-07-01 VITALS — BP 130/78 | HR 50 | Wt 217.4 lb

## 2013-07-01 DIAGNOSIS — E7800 Pure hypercholesterolemia, unspecified: Secondary | ICD-10-CM

## 2013-07-01 DIAGNOSIS — G4730 Sleep apnea, unspecified: Secondary | ICD-10-CM

## 2013-07-01 DIAGNOSIS — R001 Bradycardia, unspecified: Secondary | ICD-10-CM

## 2013-07-01 DIAGNOSIS — I498 Other specified cardiac arrhythmias: Secondary | ICD-10-CM

## 2013-07-01 DIAGNOSIS — Z952 Presence of prosthetic heart valve: Secondary | ICD-10-CM

## 2013-07-01 DIAGNOSIS — Z953 Presence of xenogenic heart valve: Secondary | ICD-10-CM

## 2013-07-01 DIAGNOSIS — D649 Anemia, unspecified: Secondary | ICD-10-CM

## 2013-07-01 LAB — HEPATIC FUNCTION PANEL
AST: 21 U/L (ref 0–37)
Albumin: 4.1 g/dL (ref 3.5–5.2)
Alkaline Phosphatase: 62 U/L (ref 39–117)
Total Protein: 7.2 g/dL (ref 6.0–8.3)

## 2013-07-01 LAB — CBC WITH DIFFERENTIAL/PLATELET
Basophils Relative: 0.4 % (ref 0.0–3.0)
Eosinophils Absolute: 0.3 10*3/uL (ref 0.0–0.7)
Lymphocytes Relative: 25.8 % (ref 12.0–46.0)
MCHC: 34.5 g/dL (ref 30.0–36.0)
Neutrophils Relative %: 57.3 % (ref 43.0–77.0)
RBC: 4.57 Mil/uL (ref 4.22–5.81)
WBC: 4 10*3/uL — ABNORMAL LOW (ref 4.5–10.5)

## 2013-07-01 LAB — BASIC METABOLIC PANEL
CO2: 27 mEq/L (ref 19–32)
Calcium: 9.1 mg/dL (ref 8.4–10.5)
Creatinine, Ser: 1 mg/dL (ref 0.4–1.5)

## 2013-07-01 LAB — LIPID PANEL
Cholesterol: 138 mg/dL (ref 0–200)
Triglycerides: 64 mg/dL (ref 0.0–149.0)

## 2013-07-01 NOTE — Assessment & Plan Note (Signed)
Patient states that he has been diagnosed previously as having sleep apnea.  He was not able to tolerate CPAP machines in the past.  He states that he does not have any difficulty sleeping

## 2013-07-01 NOTE — Progress Notes (Signed)
Nicholas Walls Date of Birth:  30-Dec-1935 [address] [place], [place]  [address] [phone]  Fax   [phone]  HPI: This pleasant 77 year old gentleman is seen for a scheduled followup office visit. He has a past history of severe aortic stenosis. He underwent aortic valve replacement with a pericardial tissue 10/13/11 by Dr. Tyrone Sage. He completed his outpatient cardiac rehabilitation program in which he did well. Since we last saw the patient he has had successful surgery for carpal tunnel syndrome of his right hand. [name] and is having no symptoms from his hand or from his heart.  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  The patient has not been experiencing any dizziness.  He has not been experiencing any heartburn.  Current Outpatient Prescriptions  Medication Sig Dispense Refill  . aspirin EC 325 MG EC tablet Take 1 tablet (325 mg total) by mouth daily.  30 tablet    . ferrous fumarate-iron polysaccharide complex (TANDEM) 162-115.2 MG CAPS Take 1 capsule by mouth daily with breakfast.        . fish oil-omega-3 fatty acids 1000 MG capsule Take 3 g by mouth daily.       . metoprolol tartrate (LOPRESSOR) 25 MG tablet TAKE 1/2 TABLET DAILY      . naproxen sodium (ANAPROX) 220 MG tablet Take 220 mg by mouth as directed. 2 daily      . simvastatin (ZOCOR) 20 MG tablet Take 20 mg by mouth at bedtime.         No current facility-administered medications for this visit.    Allergies  Allergen Reactions  . Other     Ultram-seeing things    Patient Active Problem List   Diagnosis Date Noted  . Pure hypercholesterolemia 12/30/2011  . Anemia 12/30/2011  . Dizziness - light-headed 12/30/2011  . S/P aortic valve replacement with bioprosthetic valve 10/29/2011  . Sleep apnea 07/01/2011  . Osteoarthritis     History  Smoking status  . Former Smoker  . Types: Cigarettes  . Quit date: 09/01/1981  Smokeless  tobacco  . Never Used    History  Alcohol Use  . 1.2 oz/week  . 2 Cans of beer per week    Comment: weekly    Family History  Problem Relation Age of Onset  . Cancer Mother     throat  . Heart failure Father   . Cancer Father     Lung Cancer  . Hypertension Sister     Review of Systems: The patient denies any heat or cold intolerance.  No weight gain or weight loss.  The patient denies headaches or blurry vision.  There is no cough or sputum production.  The patient denies dizziness.  There is no hematuria or hematochezia.  The patient denies any muscle aches or arthritis.  The patient denies any rash.  The patient denies frequent falling or instability.  There is no history of depression or anxiety.  All other systems were reviewed and are negative.   Physical Exam: Filed Vitals:   07/01/13 1030  BP: 130/78  Pulse: 50   the general appearance reveals a well-developed well-nourished gentleman in no distress.The head and neck exam reveals pupils equal and reactive.  Extraocular movements are full.  There is no scleral icterus.  The mouth and pharynx are normal.  The neck is supple.  The carotids reveal no bruits.  The jugular venous pressure is normal.  The  thyroid is not enlarged.  There is no lymphadenopathy.  The chest is clear to percussion and auscultation.  There are no rales or rhonchi.  Expansion of the chest is symmetrical.  The precordium is quiet.  The first heart sound is normal.  The second heart sound is physiologically split.  There is a soft systolic flow murmur across the prosthetic aortic valve.  No diastolic murmur. There is no abnormal lift or heave.  The abdomen is soft and nontender.  The bowel sounds are normal.  The liver and spleen are not enlarged.  There are no abdominal masses.  There are no abdominal bruits.  Extremities reveal good pedal pulses.  There is no phlebitis or edema.  There is no cyanosis or clubbing.  Strength is normal and symmetrical in all  extremities.  There is no lateralizing weakness.  There are no sensory deficits.  The skin is warm and dry.  There is no rash.   EKG today shows sinus bradycardia at 50 per minute otherwise normal EKG.   Assessment / Plan: Because of his sinus bradycardia we will cut back on his metoprolol to just 12.5 mg once a day.  We are checking lab work today including lipids and CBC. Continue full activity.  Recheck in 4 months for office visit

## 2013-07-01 NOTE — Assessment & Plan Note (Signed)
The patient has not been expressing any chest pain or shortness of breath.  No dizziness or syncope.  Energy level is good.

## 2013-07-01 NOTE — Assessment & Plan Note (Signed)
The patient is not sure that he needs to stay on his tandem multivitamin long-term.  We will check a CBC today and consider cutting back to possibly every other day.

## 2013-07-01 NOTE — Patient Instructions (Signed)
Will obtain labs today and call you with the results (lp/bmet/hfp/cbc)  DECREASE YOUR LOPRESSOR (METOPROLOL) TO 12.5 MG DAILY  Your physician recommends that you schedule a follow-up appointment in: 4 months

## 2013-07-03 NOTE — Progress Notes (Signed)
Quick Note:  Please report to patient. The recent labs are stable. Continue same medication and careful diet. The Hgb is normal now so he can stop the iron pills. Recheck CBC at next OV ______

## 2013-07-04 ENCOUNTER — Telehealth: Payer: Self-pay | Admitting: *Deleted

## 2013-07-04 NOTE — Telephone Encounter (Signed)
Advised patient of lab results  

## 2013-07-04 NOTE — Telephone Encounter (Signed)
Message copied by Burnell Blanks on Mon Jul 04, 2013  6:17 PM ------      Message from: Cassell Clement      Created: Sun Jul 03, 2013  6:56 PM       Please report to patient.  The recent labs are stable. Continue same medication and careful diet. The Hgb is normal now so he can stop the iron pills. Recheck CBC at next OV ------

## 2013-10-21 ENCOUNTER — Ambulatory Visit: Payer: Medicare Other | Admitting: Cardiology

## 2013-10-26 ENCOUNTER — Encounter: Payer: Self-pay | Admitting: Cardiology

## 2013-10-26 ENCOUNTER — Ambulatory Visit (INDEPENDENT_AMBULATORY_CARE_PROVIDER_SITE_OTHER): Payer: Medicare Other | Admitting: Cardiology

## 2013-10-26 VITALS — BP 140/76 | HR 60 | Ht 74.0 in | Wt 220.0 lb

## 2013-10-26 DIAGNOSIS — E7800 Pure hypercholesterolemia, unspecified: Secondary | ICD-10-CM | POA: Diagnosis not present

## 2013-10-26 DIAGNOSIS — Z953 Presence of xenogenic heart valve: Secondary | ICD-10-CM

## 2013-10-26 DIAGNOSIS — Z952 Presence of prosthetic heart valve: Secondary | ICD-10-CM | POA: Diagnosis not present

## 2013-10-26 DIAGNOSIS — G4730 Sleep apnea, unspecified: Secondary | ICD-10-CM | POA: Diagnosis not present

## 2013-10-26 DIAGNOSIS — D649 Anemia, unspecified: Secondary | ICD-10-CM | POA: Diagnosis not present

## 2013-10-26 LAB — CBC WITH DIFFERENTIAL/PLATELET
Basophils Absolute: 0 10*3/uL (ref 0.0–0.1)
Basophils Relative: 0.5 % (ref 0.0–3.0)
Eosinophils Absolute: 0.2 10*3/uL (ref 0.0–0.7)
Eosinophils Relative: 5.9 % — ABNORMAL HIGH (ref 0.0–5.0)
HCT: 41.9 % (ref 39.0–52.0)
Hemoglobin: 13.8 g/dL (ref 13.0–17.0)
Lymphocytes Relative: 22 % (ref 12.0–46.0)
Lymphs Abs: 0.8 10*3/uL (ref 0.7–4.0)
MCHC: 33 g/dL (ref 30.0–36.0)
MCV: 94.7 fl (ref 78.0–100.0)
Monocytes Absolute: 0.3 10*3/uL (ref 0.1–1.0)
Monocytes Relative: 9.1 % (ref 3.0–12.0)
Neutro Abs: 2.3 10*3/uL (ref 1.4–7.7)
Neutrophils Relative %: 62.5 % (ref 43.0–77.0)
Platelets: 142 10*3/uL — ABNORMAL LOW (ref 150.0–400.0)
RBC: 4.42 Mil/uL (ref 4.22–5.81)
RDW: 13.7 % (ref 11.5–14.6)
WBC: 3.7 10*3/uL — ABNORMAL LOW (ref 4.5–10.5)

## 2013-10-26 NOTE — Progress Notes (Signed)
Nicholas Walls Date of Birth:  03/30/1936 [address] [place], [place]  [address] [phone]  Fax   [phone]  HPI: This pleasant 78 year old gentleman is seen for a scheduled followup office visit. He has a past history of severe aortic stenosis. He underwent aortic valve replacement with a pericardial tissue 10/13/11 by Dr. Tyrone Sage. He completed his outpatient cardiac rehabilitation program in which he did well.   He has a history of arthritis and has  had successful surgery for carpal tunnel syndrome of his right hand. [name] and is having no symptoms from his hand or from his heart.  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  The patient has not been experiencing any dizziness.  He has not been experiencing any heartburn.  He has a past history of anemia.  At his last visit his hemoglobin was normal and he stopped his iron therapy. Current Outpatient Prescriptions  Medication Sig Dispense Refill  . aspirin EC 325 MG EC tablet Take 1 tablet (325 mg total) by mouth daily.  30 tablet    . fish oil-omega-3 fatty acids 1000 MG capsule Take 3 g by mouth daily.       . metoprolol tartrate (LOPRESSOR) 25 MG tablet TAKE 1/2 TABLET DAILY      . naproxen sodium (ANAPROX) 220 MG tablet Take 220 mg by mouth as directed. 2 daily      . simvastatin (ZOCOR) 20 MG tablet Take 20 mg by mouth at bedtime.         No current facility-administered medications for this visit.    Allergies  Allergen Reactions  . Other     Ultram-seeing things    Patient Active Problem List   Diagnosis Date Noted  . Pure hypercholesterolemia 12/30/2011  . Anemia 12/30/2011  . Dizziness - light-headed 12/30/2011  . S/P aortic valve replacement with bioprosthetic valve 10/29/2011  . Sleep apnea 07/01/2011  . Osteoarthritis     History  Smoking status  . Former Smoker  . Types: Cigarettes  . Quit date: 09/01/1981  Smokeless tobacco  . Never  Used    History  Alcohol Use  . 1.2 oz/week  . 2 Cans of beer per week    Comment: weekly    Family History  Problem Relation Age of Onset  . Cancer Mother     throat  . Heart failure Father   . Cancer Father     Lung Cancer  . Hypertension Sister     Review of Systems: The patient denies any heat or cold intolerance.  No weight gain or weight loss.  The patient denies headaches or blurry vision.  There is no cough or sputum production.  The patient denies dizziness.  There is no hematuria or hematochezia.  The patient denies any muscle aches or arthritis.  The patient denies any rash.  The patient denies frequent falling or instability.  There is no history of depression or anxiety.  All other systems were reviewed and are negative.   Physical Exam: Filed Vitals:   10/26/13 0950  BP: 140/76  Pulse: 60   the general appearance reveals a well-developed well-nourished gentleman in no distress.The head and neck exam reveals pupils equal and reactive.  Extraocular movements are full.  There is no scleral icterus.  The mouth and pharynx are normal.  The neck is supple.  The carotids reveal no bruits.  The jugular venous pressure is  normal.  The  thyroid is not enlarged.  There is no lymphadenopathy.  The chest is clear to percussion and auscultation.  There are no rales or rhonchi.  Expansion of the chest is symmetrical.  The precordium is quiet.  The first heart sound is normal.  The second heart sound is physiologically split.  There is a soft systolic flow murmur across the prosthetic aortic valve.  No diastolic murmur. There is no abnormal lift or heave.  The abdomen is soft and nontender.  The bowel sounds are normal.  The liver and spleen are not enlarged.  There are no abdominal masses.  There are no abdominal bruits.  Extremities reveal good pedal pulses.  There is no phlebitis or edema.  There is no cyanosis or clubbing.  Strength is normal and symmetrical in all extremities.  There  is no lateralizing weakness.  There are no sensory deficits.  The skin is warm and dry.  There is no rash.      Assessment / Plan: The patient is doing well.  Check CBC today.  Continue current therapy.  Recheck in 4 months for office visit EKG lipid panel hepatic function panel and basal metabolic panel

## 2013-10-26 NOTE — Assessment & Plan Note (Signed)
The patient carries a diagnosis of sleep apnea.  However he states that he sleeps very well and he sleeps all night without interruption.  No nocturia.  He has never needed a CPAP machine.

## 2013-10-26 NOTE — Addendum Note (Signed)
Addended by: Linzie Collin D on: 10/26/2013 10:58 AM   Modules accepted: Orders

## 2013-10-26 NOTE — Assessment & Plan Note (Signed)
The patient is on low-dose simvastatin for his hypercholesterolemia.  He is not having a myalgias.

## 2013-10-26 NOTE — Assessment & Plan Note (Signed)
The patient is not having any chest pain or shortness of breath.  No palpitations.  No dizziness or syncope.  Exercise tolerance is excellent.

## 2013-10-26 NOTE — Patient Instructions (Signed)
Will obtain labs today and call you with the results (cbc)  Your physician recommends that you continue on your current medications as directed. Please refer to the Current Medication list given to you today.  Your physician recommends that you schedule a follow-up appointment in: 4 months with fasting labs (lp/bmet/hfp) and EKG

## 2013-10-26 NOTE — Assessment & Plan Note (Signed)
The patient's anemia will be rechecked today.  He has been off iron therapy for 4 months.

## 2013-12-02 ENCOUNTER — Other Ambulatory Visit: Payer: Self-pay | Admitting: *Deleted

## 2013-12-02 MED ORDER — METOPROLOL TARTRATE 25 MG PO TABS: ORAL_TABLET | ORAL | Status: DC

## 2014-02-13 ENCOUNTER — Ambulatory Visit: Payer: Medicare Other | Admitting: Cardiology

## 2014-02-14 ENCOUNTER — Ambulatory Visit: Payer: Medicare Other | Admitting: Cardiology

## 2014-03-08 ENCOUNTER — Encounter (INDEPENDENT_AMBULATORY_CARE_PROVIDER_SITE_OTHER): Payer: Self-pay

## 2014-03-08 ENCOUNTER — Encounter: Payer: Self-pay | Admitting: Cardiology

## 2014-03-08 ENCOUNTER — Ambulatory Visit (INDEPENDENT_AMBULATORY_CARE_PROVIDER_SITE_OTHER): Payer: Medicare Other | Admitting: Cardiology

## 2014-03-08 VITALS — BP 126/70 | HR 68 | Ht 74.0 in | Wt 209.0 lb

## 2014-03-08 DIAGNOSIS — D508 Other iron deficiency anemias: Secondary | ICD-10-CM

## 2014-03-08 DIAGNOSIS — E7800 Pure hypercholesterolemia, unspecified: Secondary | ICD-10-CM

## 2014-03-08 DIAGNOSIS — Z953 Presence of xenogenic heart valve: Secondary | ICD-10-CM

## 2014-03-08 DIAGNOSIS — Z952 Presence of prosthetic heart valve: Secondary | ICD-10-CM

## 2014-03-08 LAB — HEPATIC FUNCTION PANEL
ALK PHOS: 62 U/L (ref 39–117)
ALT: 19 U/L (ref 0–53)
AST: 23 U/L (ref 0–37)
Albumin: 3.9 g/dL (ref 3.5–5.2)
BILIRUBIN DIRECT: 0.1 mg/dL (ref 0.0–0.3)
BILIRUBIN TOTAL: 0.8 mg/dL (ref 0.2–1.2)
Total Protein: 6.9 g/dL (ref 6.0–8.3)

## 2014-03-08 LAB — BASIC METABOLIC PANEL
BUN: 22 mg/dL (ref 6–23)
CALCIUM: 9.3 mg/dL (ref 8.4–10.5)
CO2: 30 mEq/L (ref 19–32)
CREATININE: 1 mg/dL (ref 0.4–1.5)
Chloride: 104 mEq/L (ref 96–112)
GFR: 78.08 mL/min (ref >60.00)
GLUCOSE: 105 mg/dL — AB (ref 70–99)
POTASSIUM: 4.7 meq/L (ref 3.5–5.1)
Sodium: 139 mEq/L (ref 135–145)

## 2014-03-08 LAB — CBC WITH DIFFERENTIAL/PLATELET
BASOS ABS: 0 10*3/uL (ref 0.0–0.1)
Basophils Relative: 0.4 % (ref 0.0–3.0)
Eosinophils Absolute: 0.2 10*3/uL (ref 0.0–0.7)
Eosinophils Relative: 5.6 % — ABNORMAL HIGH (ref 0.0–5.0)
HEMATOCRIT: 38.2 % — AB (ref 39.0–52.0)
Hemoglobin: 12.8 g/dL — ABNORMAL LOW (ref 13.0–17.0)
LYMPHS ABS: 0.8 10*3/uL (ref 0.7–4.0)
Lymphocytes Relative: 23.5 % (ref 12.0–46.0)
MCHC: 33.5 g/dL (ref 30.0–36.0)
MCV: 88.4 fl (ref 78.0–100.0)
MONOS PCT: 9.4 % (ref 3.0–12.0)
Monocytes Absolute: 0.3 10*3/uL (ref 0.1–1.0)
NEUTROS PCT: 61.1 % (ref 43.0–77.0)
Neutro Abs: 2.1 10*3/uL (ref 1.4–7.7)
PLATELETS: 160 10*3/uL (ref 150.0–400.0)
RBC: 4.32 Mil/uL (ref 4.22–5.81)
RDW: 14.7 % (ref 11.5–15.5)
WBC: 3.4 10*3/uL — ABNORMAL LOW (ref 4.0–10.5)

## 2014-03-08 LAB — LIPID PANEL
CHOLESTEROL: 137 mg/dL (ref 0–200)
HDL: 43.5 mg/dL (ref >39.00)
LDL Cholesterol: 80 mg/dL (ref 0–99)
NonHDL: 93.5
TRIGLYCERIDES: 68 mg/dL (ref 0.0–149.0)
Total CHOL/HDL Ratio: 3
VLDL: 13.6 mg/dL (ref 0.0–40.0)

## 2014-03-08 NOTE — Patient Instructions (Signed)
Will obtain labs today and call you with the results (lp/bmet/hfp/cbc)  Your physician wants you to follow-up in: 4 month ov/ekg You will receive a reminder letter in the mail two months in advance. If you don't receive a letter, please call our office to schedule the follow-up appointment.   Your physician recommends that you continue on your current medications as directed. Please refer to the Current Medication list given to you today.

## 2014-03-08 NOTE — Assessment & Plan Note (Signed)
The patient is not experiencing any evidence of GI blood loss.  We are checking a CBC on him today.  His energy level is normal.  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

## 2014-03-08 NOTE — Assessment & Plan Note (Signed)
The patient is on simvastatin 20 mg daily.  He is not having any myalgias.  Lab work today fasting is pending.

## 2014-03-08 NOTE — Progress Notes (Signed)
Nicholas Walls Date of Birth:  1936-05-31 [facility] [address] [place], [place]  [id number] [phone]  Fax   [phone]  HPI: This pleasant 78 year old gentleman is seen for a scheduled followup office visit. He had a past history of severe aortic stenosis. He underwent aortic valve replacement with a pericardial tissue 10/13/11 by Dr. Tyrone SageGerhardt. He completed his outpatient cardiac rehabilitation program in which he did well. [name]. [name] and is having no symptoms from his hand or from his heart. 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 The patient has not been experiencing any dizziness. He has not been experiencing any heartburn. He has a past history of anemia.  He recently started back on his iron therapy.   Current Outpatient Prescriptions  Medication Sig Dispense Refill  . aspirin EC 325 MG EC tablet Take 1 tablet (325 mg total) by mouth daily.  30 tablet    . fish oil-omega-3 fatty acids 1000 MG capsule Take 3 g by mouth daily.       . metoprolol tartrate (LOPRESSOR) 25 MG tablet TAKE 1/2 TABLET DAILY  90 tablet  3  . naproxen sodium (ANAPROX) 220 MG tablet Take 220 mg by mouth as directed. 2 daily      . simvastatin (ZOCOR) 20 MG tablet Take 20 mg by mouth at bedtime.         No current facility-administered medications for this visit.    Allergies  Allergen Reactions  . Other     Ultram-seeing things    Patient Active Problem List   Diagnosis Date Noted  . Pure hypercholesterolemia 12/30/2011  . Anemia 12/30/2011  . Dizziness - light-headed 12/30/2011  . S/P aortic valve replacement with bioprosthetic valve 10/29/2011  . Sleep apnea 07/01/2011  . Osteoarthritis     History  Smoking status  . Former Smoker  . Types: Cigarettes  . Quit date: 09/01/1981  Smokeless tobacco  . Never Used      History  Alcohol Use  . 1.2 oz/week  . 2 Cans of beer per week    Comment: weekly    Family History  Problem Relation Age of Onset  . Cancer Mother     throat  . Heart failure Father   . Cancer Father     Lung Cancer  . Hypertension Sister     Review of Systems: The patient denies any heat or cold intolerance.  No weight gain or weight loss.  The patient denies headaches or blurry vision.  There is no cough or sputum production.  The patient denies dizziness.  There is no hematuria or hematochezia.  The patient denies any muscle aches or arthritis.  The patient denies any rash.  The patient denies frequent falling or instability.  There is no history of depression or anxiety.  All other systems were reviewed and are negative.   Physical Exam: Filed Vitals:   03/08/14 0928  BP: 126/70  Pulse: 68   The general appearance reveals a well-developed well-nourished gentleman in no distress.The head and neck exam reveals pupils equal and reactive.  Extraocular movements are full.  There is no scleral icterus.  The mouth and pharynx are normal.  The neck is supple.  The carotids reveal no bruits.  The jugular venous pressure is normal.  The  thyroid is  not enlarged.  There is no lymphadenopathy.  The chest is clear to percussion and auscultation.  There are no rales or rhonchi.  Expansion of the chest is symmetrical.  The precordium is quiet.  The first heart sound is normal.  The second heart sound is physiologically split.  There is a faint grade 1/6 systolic ejection murmur at the base.  No diastolic murmur.  There is no abnormal lift or heave.  The abdomen is soft and nontender.  The bowel sounds are normal.  The liver and spleen are not enlarged.  There are no abdominal masses.  There are no abdominal bruits.  Extremities reveal good pedal pulses.  There is no phlebitis or edema.  There is no cyanosis or clubbing.  Strength is normal and symmetrical in all extremities.  There is no  lateralizing weakness.  There are no sensory deficits.  The skin is warm and dry.  There is no rash.     Assessment / Plan: 1.  Status post aortic valve replacement with a pericardial tissue valve on 10/13/11 by Dr. Tyrone Sage. 2. history of hypercholesterolemia 3. past history of anemia 4. osteoarthritis  Plan: Continue same medication.  We are checking fasting lipid panel hepatic function panel and basal metabolic panel and CBC today. Recheck in 4 months for office visit and EKG.

## 2014-03-08 NOTE — Assessment & Plan Note (Signed)
The patient has not been having any cardiac symptoms.  No chest pain or shortness of breath.  No dizziness or syncope.  No palpitations

## 2014-03-09 NOTE — Progress Notes (Signed)
Quick Note:  Please report to patient. The recent labs are stable. Continue same medication and careful diet. Lipids are excellent. Liver function tests are normal. Blood sugar 105 slightly high. Watch sweets. ______

## 2014-04-25 DIAGNOSIS — N529 Male erectile dysfunction, unspecified: Secondary | ICD-10-CM | POA: Diagnosis not present

## 2014-04-25 DIAGNOSIS — E785 Hyperlipidemia, unspecified: Secondary | ICD-10-CM | POA: Diagnosis not present

## 2014-04-25 DIAGNOSIS — Z23 Encounter for immunization: Secondary | ICD-10-CM | POA: Diagnosis not present

## 2014-07-12 ENCOUNTER — Ambulatory Visit: Payer: Medicare Other | Admitting: Cardiology

## 2014-07-31 ENCOUNTER — Encounter: Payer: Self-pay | Admitting: Cardiology

## 2014-07-31 ENCOUNTER — Ambulatory Visit (INDEPENDENT_AMBULATORY_CARE_PROVIDER_SITE_OTHER): Payer: Medicare Other | Admitting: Cardiology

## 2014-07-31 VITALS — BP 138/70 | HR 61 | Ht 74.0 in | Wt 211.0 lb

## 2014-07-31 DIAGNOSIS — M158 Other polyosteoarthritis: Secondary | ICD-10-CM | POA: Diagnosis not present

## 2014-07-31 DIAGNOSIS — E7800 Pure hypercholesterolemia, unspecified: Secondary | ICD-10-CM

## 2014-07-31 DIAGNOSIS — D508 Other iron deficiency anemias: Secondary | ICD-10-CM

## 2014-07-31 DIAGNOSIS — R42 Dizziness and giddiness: Secondary | ICD-10-CM | POA: Diagnosis not present

## 2014-07-31 DIAGNOSIS — Z953 Presence of xenogenic heart valve: Secondary | ICD-10-CM

## 2014-07-31 DIAGNOSIS — Z954 Presence of other heart-valve replacement: Secondary | ICD-10-CM | POA: Diagnosis not present

## 2014-07-31 NOTE — Assessment & Plan Note (Signed)
The patient is not having any chest pain or shortness of breath.  Exercise tolerance remains excellent.

## 2014-07-31 NOTE — Assessment & Plan Note (Signed)
The patient's hemoglobin in July was 12.8.  He has not been aware of any hematochezia or melena.  We will reduce his dose of aspirin to just 81 mg daily.  We will recheck a CBC at his next visit.

## 2014-07-31 NOTE — Assessment & Plan Note (Signed)
The patient has not been experiencing any further lightheaded episodes.

## 2014-07-31 NOTE — Patient Instructions (Signed)
DECREASE YOUR ASPIRIN TO 81 MG DAILY  Your physician wants you to follow-up in: 4 months with fasting labs (lp/bmet/hfp/cbc) You will receive a reminder letter in the mail two months in advance. If you don't receive a letter, please call our office to schedule the follow-up appointment.

## 2014-07-31 NOTE — Assessment & Plan Note (Signed)
The patient has osteoarthritis particularly of his hands.  This has been helped with daily Aleve

## 2014-07-31 NOTE — Progress Notes (Signed)
[name] Date of Birth:  [birth year] [facility] [address] [place], [place]  [address] [phone]  Fax   [phone]  HPI: This pleasant 78 year old gentleman is seen for a scheduled followup office visit. He had a past history of severe aortic stenosis. He underwent aortic valve replacement with a pericardial tissue 10/13/11 by Dr. Tyrone Sage. He completed his outpatient cardiac rehabilitation program in which he did well. [name]. [name] and is having no symptoms from his hand or from his heart. 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 The patient has not been experiencing any dizziness. He has not been experiencing any heartburn. He has a past history of anemia.  He recently started back on his iron therapy.  He has been taking a full aspirin in the form of Bufferin daily as well as taking Aleve daily.  He denies any dyspepsia. Current Outpatient Prescriptions  Medication Sig Dispense Refill  . aspirin 81 MG tablet Take 81 mg by mouth daily.    . fish oil-omega-3 fatty acids 1000 MG capsule Take 3 g by mouth daily.     . metoprolol tartrate (LOPRESSOR) 25 MG tablet TAKE 1/2 TABLET DAILY 90 tablet 3  . naproxen sodium (ANAPROX) 220 MG tablet Take 220 mg by mouth as directed. 2 daily    . simvastatin (ZOCOR) 20 MG tablet Take 20 mg by mouth at bedtime.       No current facility-administered medications for this visit.    Allergies  Allergen Reactions  . Other     Ultram-seeing things    Patient Active Problem List   Diagnosis Date Noted  . Pure hypercholesterolemia 12/30/2011  . Anemia 12/30/2011  . Dizziness - light-headed 12/30/2011  . S/P aortic valve replacement with bioprosthetic valve 10/29/2011  . Sleep apnea 07/01/2011  . Osteoarthritis     History  Smoking status  . Former Smoker  .  Types: Cigarettes  . Quit date: 09/01/1981  Smokeless tobacco  . Never Used    History  Alcohol Use  . 1.2 oz/week  . 2 Cans of beer per week    Comment: weekly    Family History  Problem Relation Age of Onset  . Cancer Mother     throat  . Heart failure Father   . Cancer Father     Lung Cancer  . Hypertension Sister     Review of Systems: The patient denies any heat or cold intolerance.  No weight gain or weight loss.  The patient denies headaches or blurry vision.  There is no cough or sputum production.  The patient denies dizziness.  There is no hematuria or hematochezia.  The patient denies any muscle aches or arthritis.  The patient denies any rash.  The patient denies frequent falling or instability.  There is no history of depression or anxiety.  All other systems were reviewed and are negative.   Physical Exam: Filed Vitals:   07/31/14 1038  BP: 138/70  Pulse:    The general appearance reveals a well-developed well-nourished gentleman in no distress.The head and neck exam reveals pupils equal and reactive.  Extraocular movements are full.  There is no scleral icterus.  The mouth and pharynx are normal.  The neck is supple.  The carotids reveal no bruits.  The jugular venous pressure is  normal.  The  thyroid is not enlarged.  There is no lymphadenopathy.  The chest is clear to percussion and auscultation.  There are no rales or rhonchi.  Expansion of the chest is symmetrical.  The precordium is quiet.  The first heart sound is normal.  The second heart sound is physiologically split.  There is a faint grade 1/6 systolic ejection murmur at the base.  No diastolic murmur.  There is no abnormal lift or heave.  The abdomen is soft and nontender.  The bowel sounds are normal.  The liver and spleen are not enlarged.  There are no abdominal masses.  There are no abdominal bruits.  Extremities reveal good pedal pulses.  There is no phlebitis or edema.  There is no cyanosis or  clubbing.  Strength is normal and symmetrical in all extremities.  There is no lateralizing weakness.  There are no sensory deficits.  The skin is warm and dry.  There is no rash.  EKG today shows normal sinus rhythm and is within normal limits.  Assessment / Plan: 1.  Status post aortic valve replacement with a pericardial tissue valve on 10/13/11 by Dr. Tyrone SageGerhardt. 2. history of hypercholesterolemia 3. past history of anemia 4. osteoarthritis  Plan: Continue same medication except reduce dose of aspirin to just 81 mg daily.   Recheck in 4 months for office visit, CBC, lipid panel, hepatic function panel, and basal metabolic panel

## 2014-11-22 ENCOUNTER — Encounter: Payer: Self-pay | Admitting: *Deleted

## 2014-11-23 ENCOUNTER — Encounter: Payer: Self-pay | Admitting: Cardiology

## 2014-11-23 ENCOUNTER — Ambulatory Visit (INDEPENDENT_AMBULATORY_CARE_PROVIDER_SITE_OTHER): Payer: Medicare Other | Admitting: Cardiology

## 2014-11-23 ENCOUNTER — Other Ambulatory Visit (INDEPENDENT_AMBULATORY_CARE_PROVIDER_SITE_OTHER): Payer: Medicare Other | Admitting: *Deleted

## 2014-11-23 VITALS — BP 142/80 | HR 66 | Ht 74.0 in | Wt 209.4 lb

## 2014-11-23 DIAGNOSIS — E7800 Pure hypercholesterolemia, unspecified: Secondary | ICD-10-CM

## 2014-11-23 DIAGNOSIS — D508 Other iron deficiency anemias: Secondary | ICD-10-CM | POA: Diagnosis not present

## 2014-11-23 DIAGNOSIS — Z954 Presence of other heart-valve replacement: Secondary | ICD-10-CM | POA: Diagnosis not present

## 2014-11-23 DIAGNOSIS — M158 Other polyosteoarthritis: Secondary | ICD-10-CM | POA: Diagnosis not present

## 2014-11-23 DIAGNOSIS — Z953 Presence of xenogenic heart valve: Secondary | ICD-10-CM

## 2014-11-23 LAB — BASIC METABOLIC PANEL
BUN: 17 mg/dL (ref 6–23)
CO2: 27 mEq/L (ref 19–32)
Calcium: 9.3 mg/dL (ref 8.4–10.5)
Chloride: 102 mEq/L (ref 96–112)
Creatinine, Ser: 1 mg/dL (ref 0.40–1.50)
GFR: 77.04 mL/min (ref >60.00)
Glucose, Bld: 97 mg/dL (ref 70–99)
POTASSIUM: 4.6 meq/L (ref 3.5–5.1)
SODIUM: 138 meq/L (ref 135–145)

## 2014-11-23 LAB — CBC WITH DIFFERENTIAL/PLATELET
BASOS ABS: 0 10*3/uL (ref 0.0–0.1)
Basophils Relative: 0.7 % (ref 0.0–3.0)
EOS ABS: 0.3 10*3/uL (ref 0.0–0.7)
Eosinophils Relative: 7.6 % — ABNORMAL HIGH (ref 0.0–5.0)
HCT: 44.8 % (ref 39.0–52.0)
Hemoglobin: 15.3 g/dL (ref 13.0–17.0)
Lymphocytes Relative: 24.4 % (ref 12.0–46.0)
Lymphs Abs: 1 10*3/uL (ref 0.7–4.0)
MCHC: 34.3 g/dL (ref 30.0–36.0)
MCV: 95.8 fl (ref 78.0–100.0)
MONO ABS: 0.4 10*3/uL (ref 0.1–1.0)
Monocytes Relative: 10 % (ref 3.0–12.0)
Neutro Abs: 2.3 10*3/uL (ref 1.4–7.7)
Neutrophils Relative %: 57.3 % (ref 43.0–77.0)
PLATELETS: 150 10*3/uL (ref 150.0–400.0)
RBC: 4.67 Mil/uL (ref 4.22–5.81)
RDW: 13.6 % (ref 11.5–15.5)
WBC: 4.1 10*3/uL (ref 4.0–10.5)

## 2014-11-23 LAB — HEPATIC FUNCTION PANEL
ALK PHOS: 69 U/L (ref 39–117)
ALT: 22 U/L (ref 0–53)
AST: 24 U/L (ref 0–37)
Albumin: 4.3 g/dL (ref 3.5–5.2)
BILIRUBIN DIRECT: 0.2 mg/dL (ref 0.0–0.3)
TOTAL PROTEIN: 7.2 g/dL (ref 6.0–8.3)
Total Bilirubin: 1.2 mg/dL (ref 0.2–1.2)

## 2014-11-23 LAB — LIPID PANEL
CHOLESTEROL: 138 mg/dL (ref 0–200)
HDL: 44 mg/dL (ref >39.00)
LDL CALC: 79 mg/dL (ref 0–99)
NonHDL: 94
Total CHOL/HDL Ratio: 3
Triglycerides: 76 mg/dL (ref 0.0–149.0)
VLDL: 15.2 mg/dL (ref 0.0–40.0)

## 2014-11-23 NOTE — Progress Notes (Signed)
Quick Note:  Please report to patient. The recent labs are stable. Continue same medication and careful diet. No longer anemic. He can stop the iron now. ______

## 2014-11-23 NOTE — Patient Instructions (Addendum)
Will obtain labs today and call you with the results (lp/bmet/hfp/cbc)  Your physician recommends that you continue on your current medications as directed. Please refer to the Current Medication list given to you today.  Your physician wants you to follow-up in: 4 month ov/ekg You will receive a reminder letter in the mail two months in advance. If you don't receive a letter, please call our office to schedule the follow-up appointment.

## 2014-11-23 NOTE — Progress Notes (Signed)
Cardiology Office Note   Date:  11/23/2014   ID:  Nicholas Walls, DOB 08/28/1936, MRN [id number]  PCP:  Lorelei Pont DO  Cardiologist:   Cassell Clement, MD   No chief complaint on file.     History of Present Illness: Nicholas Walls is a 79 y.o. male who presents for a four-month follow-up office visit This pleasant 79 year old gentleman is seen for a scheduled followup office visit. He had a past history of severe aortic stenosis. He underwent aortic valve replacement with a pericardial tissue 10/13/11 by Dr. Tyrone Sage. He completed his outpatient cardiac rehabilitation program in which he did well. [name]. [name] and is having no symptoms from his hand or from his heart. 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 The patient has not been experiencing any dizziness. He has not been experiencing any heartburn. He has a past history of anemia.  Since last visit he has been doing well.  He stays physically active.  His arthritis is no worse.  He takes 2 Aleve at bedtime.  He is also on a baby aspirin.  He is not having any dyspepsia or heartburn.  He denies chest pain or shortness of breath dizziness or syncope.   Past Medical History  Diagnosis Date  . Hyperlipidemia   . Aortic stenosis, severe     s/p AVR with pericardial tissue valve Feb 2013 per Dr. Tyrone Sage  . Osteoarthritis      End-stage osteoarthritis--left knee  . History of hiatal hernia   . History of anemia of chronic disease   . Hyponatremia     Mild hyponatremia, allowed to self-correct, asymptomatic  . Aortic valve defect   . Hypertension   . Bradycardia by electrocardiogram   . Sleep apnea      4 yrs ago     no cpap  . Full dentures     Past Surgical History  Procedure Laterality Date  . Cholecystectomy    . Total knee arthroplasty      bilateral  . Joint  replacement      bil knees  . Cervical disc surgery    . Cardiac catheterization    . Aortic valve replacement  10/13/2011    Procedure: AORTIC VALVE REPLACEMENT (AVR);  Surgeon: [name], MD;  Location: [facility];  Service: Open Heart Surgery;  Laterality: N/A;  . Carpal tunnel release  06/01/2012    Procedure: CARPAL TUNNEL RELEASE;  Surgeon: Wyn Forster., MD;  Location: [facility];  Service: Orthopedics;  Laterality: Right;  . Ulnar nerve transposition  06/01/2012    Procedure: ULNAR NERVE DECOMPRESSION/TRANSPOSITION;  Surgeon: Wyn Forster., MD;  Location: [facility];  Service: Orthopedics;  Laterality: Right;  right ulnar nerve decompression     Current Outpatient Prescriptions  Medication Sig Dispense Refill  . aspirin 81 MG tablet Take 81 mg by mouth daily.    . fish oil-omega-3 fatty acids 1000 MG capsule Take 3 g by mouth daily.     . metoprolol tartrate (LOPRESSOR) 25 MG tablet TAKE 1/2 TABLET DAILY 90 tablet 3  . naproxen sodium (ANAPROX) 220 MG tablet Take 220 mg by mouth as directed. 2 daily    . simvastatin (ZOCOR) 20 MG tablet Take 20 mg by mouth at bedtime.  No current facility-administered medications for this visit.    Allergies:   Other    Social History:  The patient  reports that he quit smoking about 33 years ago. His smoking use included Cigarettes. He has never used smokeless tobacco. He reports that he drinks about 1.2 oz of alcohol per week. He reports that he does not use illicit drugs.   Family History:  [name] history includes Heart failure in his father; Hypertension in his sister; Lung cancer in his father; Throat cancer in his mother.    ROS:  Please see the history of present illness.   Otherwise, review of systems are positive for none.   All other systems are reviewed and negative.    PHYSICAL EXAM: VS:  BP 142/80 mmHg  Pulse 66  Ht 6\' 2"  (1.88 m)  Wt 209 lb 6.4 oz (94.983 kg)   BMI 26.87 kg/m2 , BMI Body mass index is 26.87 kg/(m^2). GEN: Well nourished, well developed, in no acute distress HEENT: normal Neck: no JVD, carotid bruits, or masses Cardiac: RRR; no murmurs, rubs, or gallops,no edema.  There is a soft grade 1/6 systolic flow murmur at the base.  No diastolic murmurs.  Respiratory:  clear to auscultation bilaterally, normal work of breathing GI: soft, nontender, nondistended, + BS MS: no deformity or atrophy Skin: warm and dry, no rash Neuro:  Strength and sensation are intact Psych: euthymic mood, full affect   EKG:  EKG is not ordered today.    Recent Labs: 03/08/2014: ALT 19; BUN 22; Creatinine 1.0; Hemoglobin 12.8*; Platelets 160.0; Potassium 4.7; Sodium 139    Lipid Panel    Component Value Date/Time   CHOL 137 03/08/2014 1013   TRIG 68.0 03/08/2014 1013   HDL 43.50 03/08/2014 1013   CHOLHDL 3 03/08/2014 1013   VLDL 13.6 03/08/2014 1013   LDLCALC 80 03/08/2014 1013      Wt Readings from Last 3 Encounters:  11/23/14 209 lb 6.4 oz (94.983 kg)  07/31/14 211 lb (95.709 kg)  03/08/14 209 lb (94.802 kg)      ASSESSMENT AND PLAN:  1. Status post aortic valve replacement with a pericardial tissue valve on 10/13/11 by Dr. Tyrone Sage. 2. history of hypercholesterolemia 3. past history of anemia 4. osteoarthritis  Plan: Continue current medication.  We are checking lab work today.  Recheck in 4 months for office visit and EKG   Current medicines are reviewed at length with the patient today.  The patient does not have concerns regarding medicines.  The following changes have been made:  no change  Labs/ tests ordered today include:   Orders Placed This Encounter  Procedures  . CBC with Differential/Platelet       Signed, Cassell Clement, MD  11/23/2014 1:08 PM    [facility] [address] [place], [place], [place]  [address] Phone: [phone]; Fax: [phone]

## 2014-11-24 ENCOUNTER — Telehealth: Payer: Self-pay | Admitting: *Deleted

## 2014-11-24 MED ORDER — METOPROLOL TARTRATE 25 MG PO TABS: ORAL_TABLET | ORAL | Status: DC

## 2014-11-24 NOTE — Telephone Encounter (Signed)
-----   Message from Cassell Clement, MD sent at 11/23/2014  9:32 PM EDT ----- Please report to patient.  The recent labs are stable. Continue same medication and careful diet. No longer anemic. He can stop the iron now.

## 2014-11-24 NOTE — Telephone Encounter (Signed)
Advised patient of lab results  

## 2015-02-01 ENCOUNTER — Telehealth: Payer: Self-pay | Admitting: *Deleted

## 2015-02-01 MED ORDER — METOPROLOL TARTRATE 25 MG PO TABS: ORAL_TABLET | ORAL | Status: DC

## 2015-02-01 NOTE — Telephone Encounter (Signed)
Spoke with patient, he is out of town and forgot his medications Called Metoprolol to [facility] in [place], [place]

## 2015-02-14 ENCOUNTER — Other Ambulatory Visit: Payer: Self-pay | Admitting: Cardiology

## 2015-03-28 ENCOUNTER — Encounter: Payer: Self-pay | Admitting: Cardiology

## 2015-03-28 ENCOUNTER — Ambulatory Visit (INDEPENDENT_AMBULATORY_CARE_PROVIDER_SITE_OTHER): Payer: Medicare Other | Admitting: Cardiology

## 2015-03-28 VITALS — BP 130/76 | HR 61 | Ht 74.0 in | Wt 206.1 lb

## 2015-03-28 DIAGNOSIS — E7800 Pure hypercholesterolemia, unspecified: Secondary | ICD-10-CM

## 2015-03-28 DIAGNOSIS — D508 Other iron deficiency anemias: Secondary | ICD-10-CM | POA: Diagnosis not present

## 2015-03-28 DIAGNOSIS — Z953 Presence of xenogenic heart valve: Secondary | ICD-10-CM

## 2015-03-28 DIAGNOSIS — Z954 Presence of other heart-valve replacement: Secondary | ICD-10-CM

## 2015-03-28 NOTE — Progress Notes (Signed)
Cardiology Office Note   Date:  03/28/2015   ID:  Nicholas Walls, DOB 01/31/1936, MRN [id number]  PCP:  Lorelei Pont DO  Cardiologist: Cassell Clement MD  No chief complaint on file.     History of Present Illness:  Nicholas Walls is a 79 y.o. male who presents for a four-month follow-up office visit This pleasant 79 year old gentleman is seen for a scheduled followup office visit. He had a past history of severe aortic stenosis. He underwent aortic valve replacement with a pericardial tissue 10/13/11 by Dr. Tyrone Sage. He completed his outpatient cardiac rehabilitation program in which he did well. [name]. [name] and is having no symptoms from his hand or from his heart. 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 The patient has not been experiencing any dizziness. He has not been experiencing any heartburn. He has a past history of anemia.  Since last visit he has been doing well. He stays physically active. His arthritis is no worse. He takes 2 Aleve at bedtime. He is also on a baby aspirin. He is not having any dyspepsia or heartburn. He denies chest pain or shortness of breath dizziness or syncope.  He has not been having any myalgias.  He is tolerating his medications.   Past Medical History  Diagnosis Date  . Hyperlipidemia   . Aortic stenosis, severe     s/p AVR with pericardial tissue valve Feb 2013 per Dr. Tyrone Sage  . Osteoarthritis      End-stage osteoarthritis--left knee  . History of hiatal hernia   . History of anemia of chronic disease   . Hyponatremia     Mild hyponatremia, allowed to self-correct, asymptomatic  . Aortic valve defect   . Hypertension   . Bradycardia by electrocardiogram   . Sleep apnea      4 yrs ago     no cpap  . Full dentures     Past Surgical History  Procedure Laterality Date  .  Cholecystectomy    . Total knee arthroplasty      bilateral  . Joint replacement      bil knees  . Cervical disc surgery    . Cardiac catheterization    . Aortic valve replacement  10/13/2011    Procedure: AORTIC VALVE REPLACEMENT (AVR);  Surgeon: [name], MD;  Location: [facility] OR;  Service: Open Heart Surgery;  Laterality: N/A;  . Carpal tunnel release  06/01/2012    Procedure: CARPAL TUNNEL RELEASE;  Surgeon: Wyn Forster., MD;  Location: [facility];  Service: Orthopedics;  Laterality: Right;  . Ulnar nerve transposition  06/01/2012    Procedure: ULNAR NERVE DECOMPRESSION/TRANSPOSITION;  Surgeon: Wyn Forster., MD;  Location: [facility];  Service: Orthopedics;  Laterality: Right;  right ulnar nerve decompression     Current Outpatient Prescriptions  Medication Sig Dispense Refill  . aspirin 81 MG tablet Take 81 mg by mouth daily.    . fish oil-omega-3 fatty acids 1000 MG capsule Take 3 g by mouth daily.     . metoprolol tartrate (LOPRESSOR) 25 MG tablet TAKE 1/2 TABLET DAILY 60 tablet 0  . naproxen sodium (ANAPROX) 220 MG tablet Take 220 mg by mouth daily.     . simvastatin (ZOCOR) 20 MG tablet Take 20 mg by mouth at bedtime.  No current facility-administered medications for this visit.    Allergies:   Ultram    Social History:  The patient  reports that he quit smoking about 33 years ago. His smoking use included Cigarettes. He has never used smokeless tobacco. He reports that he drinks about 1.2 oz of alcohol per week. He reports that he does not use illicit drugs.   Family History:  [name] history includes Heart failure in his father; Hypertension in his sister; Lung cancer in his father; Throat cancer in his mother.    ROS:  Please see the history of present illness.   Otherwise, review of systems are positive for none.   All other systems are reviewed and negative.    PHYSICAL EXAM: VS:  BP 130/76 mmHg   Pulse 61  Ht 6\' 2"  (1.88 m)  Wt 206 lb 1.9 oz (93.495 kg)  BMI 26.45 kg/m2 , BMI Body mass index is 26.45 kg/(m^2). GEN: Well nourished, well developed, in no acute distress HEENT: normal Neck: no JVD, carotid bruits, or masses Cardiac: RRR; no diastolic murmurs, rubs, or gallops,no edema.  Aortic closure sound is crisp.  Respiratory:  clear to auscultation bilaterally, normal work of breathing GI: soft, nontender, nondistended, + BS MS: no deformity or atrophy Skin: warm and dry, no rash Neuro:  Strength and sensation are intact Psych: euthymic mood, full affect   EKG:  EKG is ordered today. The ekg ordered today demonstrates normal sinus rhythm.  Within normal limits.   Recent Labs: 11/23/2014: ALT 22; BUN 17; Creatinine, Ser 1.00; Hemoglobin 15.3; Platelets 150.0; Potassium 4.6; Sodium 138    Lipid Panel    Component Value Date/Time   CHOL 138 11/23/2014 1024   TRIG 76.0 11/23/2014 1024   HDL 44.00 11/23/2014 1024   CHOLHDL 3 11/23/2014 1024   VLDL 15.2 11/23/2014 1024   LDLCALC 79 11/23/2014 1024      Wt Readings from Last 3 Encounters:  03/28/15 206 lb 1.9 oz (93.495 kg)  11/23/14 209 lb 6.4 oz (94.983 kg)  07/31/14 211 lb (95.709 kg)         ASSESSMENT AND PLAN:  1. Status post aortic valve replacement with a pericardial tissue valve on 10/13/11 by Dr. Tyrone Sage. 2. history of hypercholesterolemia 3. past history of anemia 4. osteoarthritis  Plan: Continue current medication. Recheck in 4 months for office visit CBC lipid panel hepatic function panel and basal metabolic panel   Current medicines are reviewed at length with the patient today.  The patient does not have concerns regarding medicines.  The following changes have been made:  no change  Labs/ tests ordered today include:   Orders Placed This Encounter  Procedures  . Lipid panel  . Hepatic function panel  . Basic metabolic panel  . CBC with Differential/Platelet  . EKG 12-Lead        Signed, Cassell Clement MD 03/28/2015 1:13 PM    [facility] [address] [place], [place], [place]  [address] Phone: [phone]; Fax: [phone]

## 2015-03-28 NOTE — Patient Instructions (Signed)
Medication Instructions:  Your physician recommends that you continue on your current medications as directed. Please refer to the Current Medication list given to you today.  Labwork: none  Testing/Procedures: none  Follow-Up: Your physician recommends that you schedule a follow-up appointment in: 4 months with fasting labs (lp/bmet/hfp/cbc)

## 2015-05-01 ENCOUNTER — Other Ambulatory Visit: Payer: Self-pay | Admitting: *Deleted

## 2015-05-01 MED ORDER — SIMVASTATIN 20 MG PO TABS: 20.0000 mg | ORAL_TABLET | Freq: Every day | ORAL | Status: DC

## 2015-05-28 DIAGNOSIS — Z23 Encounter for immunization: Secondary | ICD-10-CM | POA: Diagnosis not present

## 2015-07-13 ENCOUNTER — Encounter: Payer: Self-pay | Admitting: Cardiology

## 2015-08-02 ENCOUNTER — Ambulatory Visit: Payer: Medicare Other | Admitting: Cardiology

## 2015-08-17 ENCOUNTER — Telehealth: Payer: Self-pay | Admitting: *Deleted

## 2015-08-17 DIAGNOSIS — Z953 Presence of xenogenic heart valve: Secondary | ICD-10-CM

## 2015-08-17 NOTE — Telephone Encounter (Signed)
Echo scheduled for patient

## 2015-09-02 DIAGNOSIS — I330 Acute and subacute infective endocarditis: Secondary | ICD-10-CM

## 2015-09-02 DIAGNOSIS — T826XXA Infection and inflammatory reaction due to cardiac valve prosthesis, initial encounter: Secondary | ICD-10-CM

## 2015-09-02 HISTORY — DX: Acute and subacute infective endocarditis: I33.0

## 2015-09-02 HISTORY — DX: Infection and inflammatory reaction due to cardiac valve prosthesis, initial encounter: T82.6XXA

## 2015-09-12 ENCOUNTER — Ambulatory Visit (HOSPITAL_COMMUNITY): Payer: Medicare Other | Attending: Cardiovascular Disease

## 2015-09-12 DIAGNOSIS — E785 Hyperlipidemia, unspecified: Secondary | ICD-10-CM | POA: Diagnosis not present

## 2015-09-12 DIAGNOSIS — Z954 Presence of other heart-valve replacement: Secondary | ICD-10-CM | POA: Diagnosis not present

## 2015-09-12 DIAGNOSIS — Z87891 Personal history of nicotine dependence: Secondary | ICD-10-CM | POA: Insufficient documentation

## 2015-09-12 DIAGNOSIS — Z953 Presence of xenogenic heart valve: Secondary | ICD-10-CM

## 2015-09-17 DIAGNOSIS — N529 Male erectile dysfunction, unspecified: Secondary | ICD-10-CM | POA: Diagnosis not present

## 2015-09-17 DIAGNOSIS — R509 Fever, unspecified: Secondary | ICD-10-CM | POA: Diagnosis not present

## 2015-09-20 ENCOUNTER — Ambulatory Visit (INDEPENDENT_AMBULATORY_CARE_PROVIDER_SITE_OTHER): Payer: Medicare Other | Admitting: Cardiology

## 2015-09-20 ENCOUNTER — Encounter: Payer: Self-pay | Admitting: Cardiology

## 2015-09-20 VITALS — BP 130/64 | HR 56 | Ht 74.0 in | Wt 206.1 lb

## 2015-09-20 DIAGNOSIS — Z953 Presence of xenogenic heart valve: Secondary | ICD-10-CM

## 2015-09-20 DIAGNOSIS — G4730 Sleep apnea, unspecified: Secondary | ICD-10-CM | POA: Diagnosis not present

## 2015-09-20 DIAGNOSIS — Z954 Presence of other heart-valve replacement: Secondary | ICD-10-CM

## 2015-09-20 DIAGNOSIS — D508 Other iron deficiency anemias: Secondary | ICD-10-CM

## 2015-09-20 DIAGNOSIS — R6883 Chills (without fever): Secondary | ICD-10-CM | POA: Diagnosis not present

## 2015-09-20 DIAGNOSIS — E7800 Pure hypercholesterolemia, unspecified: Secondary | ICD-10-CM

## 2015-09-20 LAB — LIPID PANEL
CHOLESTEROL: 134 mg/dL (ref 125–200)
HDL: 33 mg/dL — ABNORMAL LOW (ref >=40)
LDL Cholesterol: 85 mg/dL (ref <130)
Total CHOL/HDL Ratio: 4.1 Ratio (ref <=5.0)
Triglycerides: 82 mg/dL (ref <150)
VLDL: 16 mg/dL (ref <30)

## 2015-09-20 LAB — CBC WITH DIFFERENTIAL/PLATELET
BASOS ABS: 0 10*3/uL (ref 0.0–0.1)
BASOS PCT: 0 % (ref 0–1)
EOS ABS: 0.1 10*3/uL (ref 0.0–0.7)
EOS PCT: 2 % (ref 0–5)
HCT: 35.3 % — ABNORMAL LOW (ref 39.0–52.0)
Hemoglobin: 12.1 g/dL — ABNORMAL LOW (ref 13.0–17.0)
LYMPHS PCT: 11 % — AB (ref 12–46)
Lymphs Abs: 0.6 10*3/uL — ABNORMAL LOW (ref 0.7–4.0)
MCH: 29.9 pg (ref 26.0–34.0)
MCHC: 34.3 g/dL (ref 30.0–36.0)
MCV: 87.2 fL (ref 78.0–100.0)
MONO ABS: 0.6 10*3/uL (ref 0.1–1.0)
MPV: 10.1 fL (ref 8.6–12.4)
Monocytes Relative: 11 % (ref 3–12)
Neutro Abs: 3.9 10*3/uL (ref 1.7–7.7)
Neutrophils Relative %: 76 % (ref 43–77)
PLATELETS: 136 10*3/uL — AB (ref 150–400)
RBC: 4.05 MIL/uL — AB (ref 4.22–5.81)
RDW: 14 % (ref 11.5–15.5)
WBC: 5.1 10*3/uL (ref 4.0–10.5)

## 2015-09-20 LAB — HEPATIC FUNCTION PANEL
ALT: 17 U/L (ref 9–46)
AST: 20 U/L (ref 10–35)
Albumin: 3.8 g/dL (ref 3.6–5.1)
Alkaline Phosphatase: 132 U/L — ABNORMAL HIGH (ref 40–115)
BILIRUBIN INDIRECT: 0.7 mg/dL (ref 0.2–1.2)
Bilirubin, Direct: 0.3 mg/dL — ABNORMAL HIGH (ref <=0.2)
TOTAL PROTEIN: 6.7 g/dL (ref 6.1–8.1)
Total Bilirubin: 1 mg/dL (ref 0.2–1.2)

## 2015-09-20 LAB — BASIC METABOLIC PANEL
BUN: 20 mg/dL (ref 7–25)
CALCIUM: 8.7 mg/dL (ref 8.6–10.3)
CO2: 26 mmol/L (ref 20–31)
CREATININE: 0.99 mg/dL (ref 0.70–1.18)
Chloride: 104 mmol/L (ref 98–110)
Glucose, Bld: 94 mg/dL (ref 65–99)
POTASSIUM: 4.3 mmol/L (ref 3.5–5.3)
Sodium: 137 mmol/L (ref 135–146)

## 2015-09-20 NOTE — Progress Notes (Signed)
Cardiology Office Note   Date:  09/20/2015   ID:  Nicholas Walls, DOB 03-Sep-1935, MRN [id number]  PCP:  Lorelei Pont, DO  Cardiologist: Cassell Clement MD  Chief Complaint  Patient presents with  . routine follow up    Patient denies chest pain, shortness of breath, le edema, or claudication      History of Present Illness:  Nicholas Walls is a 80 y.o. male who presents for a four-month follow-up office visit  He had a past history of severe aortic stenosis. He underwent aortic valve replacement with a pericardial tissue 10/13/11 by Dr. Tyrone Sage. He completed his outpatient cardiac rehabilitation program in which he did well. [name]. [name] and is having no symptoms from his hand or from his heart. 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 The patient has not been experiencing any dizziness. He has not been experiencing any heartburn. He has a past history of anemia. in the past he has been on oral iron therapy but not recently. Last weekend the patient had several episodes of hard chills.  These residual slight headache.  The chills lasted about 90 minutes and then resolved without specific therapy. The patient denies any chest pain or shortness of breath.  He does have some mild trouble with his balance since he had cervical spine surgery 5 or 6 years ago. Since last visit he has been doing well. He stays physically active. His arthritis is no worse. He takes 2 Aleve at bedtime. He is also on a baby aspirin. He is not having any dyspepsia or heartburn. He denies chest pain or shortness of breath dizziness or syncope. He has not been having any myalgias. He is tolerating his medications. The patient had an update of his echocardiogram on 09/12/15 which showed normal left ventricular systolic function with grade 2 diastolic  dysfunction.  There was a small gradient across the prosthetic aortic valve with a peak pressure of 26 and mean pressure of 14.  Past Medical History  Diagnosis Date  . Hyperlipidemia   . Aortic stenosis, severe     s/p AVR with pericardial tissue valve Feb 2013 per Dr. Tyrone Sage  . Osteoarthritis      End-stage osteoarthritis--left knee  . History of hiatal hernia   . History of anemia of chronic disease   . Hyponatremia     Mild hyponatremia, allowed to self-correct, asymptomatic  . Aortic valve defect   . Hypertension   . Bradycardia by electrocardiogram   . Sleep apnea      4 yrs ago     no cpap  . Full dentures     Past Surgical History  Procedure Laterality Date  . Cholecystectomy    . Total knee arthroplasty      bilateral  . Joint replacement      bil knees  . Cervical disc surgery    . Cardiac catheterization    . Aortic valve replacement  10/13/2011    Procedure: AORTIC VALVE REPLACEMENT (AVR);  Surgeon: [name], MD;  Location: [facility] OR;  Service: Open Heart Surgery;  Laterality: N/A;  . Carpal tunnel release  06/01/2012    Procedure: CARPAL TUNNEL RELEASE;  Surgeon: Wyn Forster., MD;  Location: [facility];  Service: Orthopedics;  Laterality: Right;  . Ulnar nerve transposition  06/01/2012  Procedure: ULNAR NERVE DECOMPRESSION/TRANSPOSITION;  Surgeon: Wyn Forster., MD;  Location: [facility];  Service: Orthopedics;  Laterality: Right;  right ulnar nerve decompression     Current Outpatient Prescriptions  Medication Sig Dispense Refill  . aspirin 81 MG tablet Take 81 mg by mouth daily.    . fish oil-omega-3 fatty acids 1000 MG capsule Take 3 g by mouth daily.     . metoprolol tartrate (LOPRESSOR) 25 MG tablet Take 12.5 mg by mouth daily.    . naproxen sodium (ANAPROX) 220 MG tablet Take 220 mg by mouth daily.     . simvastatin (ZOCOR) 20 MG tablet Take 1 tablet (20 mg total) by mouth at bedtime. 90 tablet 3   No  current facility-administered medications for this visit.    Allergies:   Ultram    Social History:  The patient  reports that he quit smoking about 34 years ago. His smoking use included Cigarettes. He has never used smokeless tobacco. He reports that he drinks about 1.2 oz of alcohol per week. He reports that he does not use illicit drugs.   Family History:  [name] history includes Heart failure in his father; Hypertension in his sister; Lung cancer in his father; Throat cancer in his mother.    ROS:  Please see the history of present illness.   Otherwise, review of systems are positive for none.   All other systems are reviewed and negative.    PHYSICAL EXAM: VS:  BP 130/64 mmHg  Pulse 56  Ht  (1.88 m)  Wt 206 lb 1.9 oz (93.495 kg)  BMI 26.45 kg/m2 , BMI Body mass index is 26.45 kg/(m^2). GEN: Well nourished, well developed, in no acute distress HEENT: normal Neck: no JVD, carotid bruits, or masses Cardiac: RRR; there is a soft systolic ejection murmur across the prosthetic valve.  No diastolic murmur.  No gallop or rub.  No peripheral edema. Respiratory:  clear to auscultation bilaterally, normal work of breathing GI: soft, nontender, nondistended, + BS MS: no deformity or atrophy Skin: warm and dry, no rash Neuro:  Strength and sensation are intact Psych: euthymic mood, full affect   EKG:  EKG is ordered today. The ekg ordered today demonstrates sinus bradycardia at 56 bpm.  No ischemic changes.   Recent Labs: 11/23/2014: ALT 22; BUN 17; Creatinine, Ser 1.00; Hemoglobin 15.3; Platelets 150.0; Potassium 4.6; Sodium 138    Lipid Panel    Component Value Date/Time   CHOL 138 11/23/2014 1024   TRIG 76.0 11/23/2014 1024   HDL 44.00 11/23/2014 1024   CHOLHDL 3 11/23/2014 1024   VLDL 15.2 11/23/2014 1024   LDLCALC 79 11/23/2014 1024      Wt Readings from Last 3 Encounters:  09/20/15 206 lb 1.9 oz (93.495 kg)  03/28/15 206 lb 1.9 oz (93.495 kg)    11/23/14 209 lb 6.4 oz (94.983 kg)        ASSESSMENT AND PLAN:  1. Status post aortic valve replacement with a pericardial tissue valve on 10/13/11 by Dr. Tyrone Sage. 2. history of hypercholesterolemia 3. past history of anemia 4. Osteoarthritis 5.recent sudden shaking chills and fever.  No localizing signs or symptoms.  No symptoms of prostatitis.  The fever resolved on its own accord with no specific therapy.   Current medicines are reviewed at length with the patient today.  The patient does not have concerns regarding medicines.  The following changes have been made:  no change  Labs/  tests ordered today include:   Orders Placed This Encounter  Procedures  . TSH  . Lipid panel  . Hepatic function panel  . Basic metabolic panel  . CBC with Differential/Platelet  . T4, free  . EKG 12-Lead     Disposition: Await results of today's lab work.  Recheck in 6 months for office visit with Dr. Duke Salvia.  Karie Schwalbe MD 09/20/2015 12:57 PM    [facility] [address] [place], [place], [place]  [address] Phone: [phone]; Fax: [phone]

## 2015-09-20 NOTE — Patient Instructions (Signed)
Medication Instructions:  Your physician recommends that you continue on your current medications as directed. Please refer to the Current Medication list given to you today.  Labwork: Lp/bmet/hfp/cbc/tsh/ft4  Testing/Procedures: none  Follow-Up: Your physician wants you to follow-up in: 6 month ov with Dr Fara Chute will receive a reminder letter in the mail two months in advance. If you don't receive a letter, please call our office to schedule the follow-up appointment.  If you need a refill on your cardiac medications before your next appointment, please call your pharmacy.

## 2015-09-21 LAB — T4, FREE: FREE T4: 1.35 ng/dL (ref 0.80–1.80)

## 2015-09-21 LAB — TSH: TSH: 1.974 u[IU]/mL (ref 0.350–4.500)

## 2015-09-22 DIAGNOSIS — J019 Acute sinusitis, unspecified: Secondary | ICD-10-CM | POA: Diagnosis not present

## 2015-09-22 DIAGNOSIS — R6883 Chills (without fever): Secondary | ICD-10-CM | POA: Diagnosis not present

## 2015-09-22 DIAGNOSIS — R1310 Dysphagia, unspecified: Secondary | ICD-10-CM | POA: Diagnosis not present

## 2015-09-22 DIAGNOSIS — R030 Elevated blood-pressure reading, without diagnosis of hypertension: Secondary | ICD-10-CM | POA: Diagnosis not present

## 2015-09-22 DIAGNOSIS — M1990 Unspecified osteoarthritis, unspecified site: Secondary | ICD-10-CM | POA: Diagnosis not present

## 2015-09-22 DIAGNOSIS — J029 Acute pharyngitis, unspecified: Secondary | ICD-10-CM | POA: Diagnosis not present

## 2015-09-25 DIAGNOSIS — R079 Chest pain, unspecified: Secondary | ICD-10-CM | POA: Diagnosis not present

## 2015-09-25 DIAGNOSIS — E785 Hyperlipidemia, unspecified: Secondary | ICD-10-CM | POA: Diagnosis not present

## 2015-09-25 DIAGNOSIS — I509 Heart failure, unspecified: Secondary | ICD-10-CM | POA: Diagnosis not present

## 2015-09-25 DIAGNOSIS — B349 Viral infection, unspecified: Secondary | ICD-10-CM | POA: Diagnosis not present

## 2015-09-25 DIAGNOSIS — Z9989 Dependence on other enabling machines and devices: Secondary | ICD-10-CM | POA: Diagnosis not present

## 2015-09-25 DIAGNOSIS — A419 Sepsis, unspecified organism: Secondary | ICD-10-CM | POA: Diagnosis not present

## 2015-09-25 DIAGNOSIS — R109 Unspecified abdominal pain: Secondary | ICD-10-CM | POA: Diagnosis not present

## 2015-09-25 DIAGNOSIS — R509 Fever, unspecified: Secondary | ICD-10-CM | POA: Diagnosis not present

## 2015-09-25 DIAGNOSIS — R42 Dizziness and giddiness: Secondary | ICD-10-CM | POA: Diagnosis not present

## 2015-09-25 DIAGNOSIS — Z79899 Other long term (current) drug therapy: Secondary | ICD-10-CM | POA: Diagnosis not present

## 2015-09-25 DIAGNOSIS — N280 Ischemia and infarction of kidney: Secondary | ICD-10-CM | POA: Diagnosis not present

## 2015-09-25 DIAGNOSIS — I38 Endocarditis, valve unspecified: Secondary | ICD-10-CM | POA: Diagnosis not present

## 2015-09-25 DIAGNOSIS — Z7901 Long term (current) use of anticoagulants: Secondary | ICD-10-CM | POA: Diagnosis not present

## 2015-09-25 DIAGNOSIS — I10 Essential (primary) hypertension: Secondary | ICD-10-CM | POA: Diagnosis not present

## 2015-09-26 ENCOUNTER — Inpatient Hospital Stay (HOSPITAL_COMMUNITY): Payer: Medicare Other

## 2015-09-26 ENCOUNTER — Encounter (HOSPITAL_COMMUNITY)
Admission: EM | Disposition: A | Discharge status: Home Health | Payer: Self-pay | Source: Other Acute Inpatient Hospital | Attending: Cardiology

## 2015-09-26 ENCOUNTER — Inpatient Hospital Stay (HOSPITAL_COMMUNITY)
Admission: EM | Admit: 2015-09-26 | Discharge: 2015-10-02 | DRG: 314 | Disposition: A | Discharge status: Home Health | Payer: Medicare Other | Source: Other Acute Inpatient Hospital | Attending: Cardiology | Admitting: Cardiology

## 2015-09-26 ENCOUNTER — Encounter (HOSPITAL_COMMUNITY): Payer: Self-pay | Admitting: Internal Medicine

## 2015-09-26 DIAGNOSIS — Z7982 Long term (current) use of aspirin: Secondary | ICD-10-CM | POA: Diagnosis not present

## 2015-09-26 DIAGNOSIS — R590 Localized enlarged lymph nodes: Secondary | ICD-10-CM | POA: Diagnosis present

## 2015-09-26 DIAGNOSIS — Z9049 Acquired absence of other specified parts of digestive tract: Secondary | ICD-10-CM

## 2015-09-26 DIAGNOSIS — N280 Ischemia and infarction of kidney: Secondary | ICD-10-CM | POA: Diagnosis present

## 2015-09-26 DIAGNOSIS — Z96653 Presence of artificial knee joint, bilateral: Secondary | ICD-10-CM | POA: Diagnosis present

## 2015-09-26 DIAGNOSIS — G4730 Sleep apnea, unspecified: Secondary | ICD-10-CM | POA: Diagnosis present

## 2015-09-26 DIAGNOSIS — T826XXA Infection and inflammatory reaction due to cardiac valve prosthesis, initial encounter: Secondary | ICD-10-CM | POA: Diagnosis not present

## 2015-09-26 DIAGNOSIS — Z953 Presence of xenogenic heart valve: Secondary | ICD-10-CM

## 2015-09-26 DIAGNOSIS — R509 Fever, unspecified: Secondary | ICD-10-CM | POA: Insufficient documentation

## 2015-09-26 DIAGNOSIS — R109 Unspecified abdominal pain: Secondary | ICD-10-CM | POA: Diagnosis present

## 2015-09-26 DIAGNOSIS — J449 Chronic obstructive pulmonary disease, unspecified: Secondary | ICD-10-CM | POA: Diagnosis present

## 2015-09-26 DIAGNOSIS — Z8249 Family history of ischemic heart disease and other diseases of the circulatory system: Secondary | ICD-10-CM

## 2015-09-26 DIAGNOSIS — B957 Other staphylococcus as the cause of diseases classified elsewhere: Secondary | ICD-10-CM | POA: Diagnosis not present

## 2015-09-26 DIAGNOSIS — Z87891 Personal history of nicotine dependence: Secondary | ICD-10-CM | POA: Diagnosis not present

## 2015-09-26 DIAGNOSIS — IMO0001 Reserved for inherently not codable concepts without codable children: Secondary | ICD-10-CM

## 2015-09-26 DIAGNOSIS — K449 Diaphragmatic hernia without obstruction or gangrene: Secondary | ICD-10-CM | POA: Diagnosis present

## 2015-09-26 DIAGNOSIS — Y831 Surgical operation with implant of artificial internal device as the cause of abnormal reaction of the patient, or of later complication, without mention of misadventure at the time of the procedure: Secondary | ICD-10-CM | POA: Diagnosis present

## 2015-09-26 DIAGNOSIS — I351 Nonrheumatic aortic (valve) insufficiency: Secondary | ICD-10-CM | POA: Diagnosis not present

## 2015-09-26 DIAGNOSIS — Z0389 Encounter for observation for other suspected diseases and conditions ruled out: Secondary | ICD-10-CM

## 2015-09-26 DIAGNOSIS — Y838 Other surgical procedures as the cause of abnormal reaction of the patient, or of later complication, without mention of misadventure at the time of the procedure: Secondary | ICD-10-CM | POA: Diagnosis not present

## 2015-09-26 DIAGNOSIS — Y712 Prosthetic and other implants, materials and accessory cardiovascular devices associated with adverse incidents: Secondary | ICD-10-CM | POA: Diagnosis not present

## 2015-09-26 DIAGNOSIS — M6281 Muscle weakness (generalized): Secondary | ICD-10-CM | POA: Diagnosis not present

## 2015-09-26 DIAGNOSIS — I330 Acute and subacute infective endocarditis: Secondary | ICD-10-CM | POA: Diagnosis not present

## 2015-09-26 DIAGNOSIS — T826XXD Infection and inflammatory reaction due to cardiac valve prosthesis, subsequent encounter: Secondary | ICD-10-CM | POA: Diagnosis not present

## 2015-09-26 DIAGNOSIS — E7800 Pure hypercholesterolemia, unspecified: Secondary | ICD-10-CM | POA: Diagnosis not present

## 2015-09-26 DIAGNOSIS — D638 Anemia in other chronic diseases classified elsewhere: Secondary | ICD-10-CM | POA: Diagnosis present

## 2015-09-26 DIAGNOSIS — N281 Cyst of kidney, acquired: Secondary | ICD-10-CM

## 2015-09-26 DIAGNOSIS — R0602 Shortness of breath: Secondary | ICD-10-CM

## 2015-09-26 DIAGNOSIS — J439 Emphysema, unspecified: Secondary | ICD-10-CM | POA: Diagnosis present

## 2015-09-26 DIAGNOSIS — Z954 Presence of other heart-valve replacement: Secondary | ICD-10-CM | POA: Diagnosis not present

## 2015-09-26 DIAGNOSIS — I309 Acute pericarditis, unspecified: Secondary | ICD-10-CM | POA: Diagnosis not present

## 2015-09-26 DIAGNOSIS — I38 Endocarditis, valve unspecified: Secondary | ICD-10-CM | POA: Diagnosis present

## 2015-09-26 DIAGNOSIS — Z22322 Carrier or suspected carrier of Methicillin resistant Staphylococcus aureus: Secondary | ICD-10-CM | POA: Diagnosis not present

## 2015-09-26 DIAGNOSIS — I712 Thoracic aortic aneurysm, without rupture: Secondary | ICD-10-CM | POA: Diagnosis present

## 2015-09-26 DIAGNOSIS — Z885 Allergy status to narcotic agent status: Secondary | ICD-10-CM

## 2015-09-26 DIAGNOSIS — R748 Abnormal levels of other serum enzymes: Secondary | ICD-10-CM | POA: Diagnosis not present

## 2015-09-26 DIAGNOSIS — I339 Acute and subacute endocarditis, unspecified: Secondary | ICD-10-CM | POA: Diagnosis not present

## 2015-09-26 DIAGNOSIS — B9689 Other specified bacterial agents as the cause of diseases classified elsewhere: Secondary | ICD-10-CM | POA: Diagnosis not present

## 2015-09-26 DIAGNOSIS — D649 Anemia, unspecified: Secondary | ICD-10-CM | POA: Diagnosis present

## 2015-09-26 DIAGNOSIS — R599 Enlarged lymph nodes, unspecified: Secondary | ICD-10-CM | POA: Diagnosis present

## 2015-09-26 DIAGNOSIS — Z79899 Other long term (current) drug therapy: Secondary | ICD-10-CM

## 2015-09-26 DIAGNOSIS — I10 Essential (primary) hypertension: Secondary | ICD-10-CM | POA: Diagnosis present

## 2015-09-26 DIAGNOSIS — E785 Hyperlipidemia, unspecified: Secondary | ICD-10-CM | POA: Diagnosis present

## 2015-09-26 DIAGNOSIS — R591 Generalized enlarged lymph nodes: Secondary | ICD-10-CM | POA: Diagnosis not present

## 2015-09-26 DIAGNOSIS — R011 Cardiac murmur, unspecified: Secondary | ICD-10-CM | POA: Diagnosis not present

## 2015-09-26 DIAGNOSIS — I340 Nonrheumatic mitral (valve) insufficiency: Secondary | ICD-10-CM

## 2015-09-26 DIAGNOSIS — Z452 Encounter for adjustment and management of vascular access device: Secondary | ICD-10-CM | POA: Diagnosis not present

## 2015-09-26 DIAGNOSIS — R1011 Right upper quadrant pain: Secondary | ICD-10-CM | POA: Diagnosis not present

## 2015-09-26 HISTORY — DX: Anemia, unspecified: D64.9

## 2015-09-26 HISTORY — DX: Unspecified osteoarthritis, unspecified site: M19.90

## 2015-09-26 HISTORY — DX: Ischemia and infarction of kidney: N28.0

## 2015-09-26 HISTORY — PX: TEE WITHOUT CARDIOVERSION: SHX5443

## 2015-09-26 HISTORY — DX: Cardiac murmur, unspecified: R01.1

## 2015-09-26 HISTORY — DX: Obstructive sleep apnea (adult) (pediatric): G47.33

## 2015-09-26 LAB — COMPREHENSIVE METABOLIC PANEL
ALBUMIN: 2.8 g/dL — AB (ref 3.5–5.0)
ALK PHOS: 124 U/L (ref 38–126)
ALT: 32 U/L (ref 17–63)
ANION GAP: 9 (ref 5–15)
AST: 33 U/L (ref 15–41)
BILIRUBIN TOTAL: 1.5 mg/dL — AB (ref 0.3–1.2)
BUN: 15 mg/dL (ref 6–20)
CO2: 26 mmol/L (ref 22–32)
Calcium: 8.6 mg/dL — ABNORMAL LOW (ref 8.9–10.3)
Chloride: 102 mmol/L (ref 101–111)
Creatinine, Ser: 1.14 mg/dL (ref 0.61–1.24)
GFR calc Af Amer: >60 mL/min (ref >60)
GFR calc non Af Amer: >60 mL/min (ref >60)
GLUCOSE: 109 mg/dL — AB (ref 65–99)
POTASSIUM: 4.2 mmol/L (ref 3.5–5.1)
SODIUM: 137 mmol/L (ref 135–145)
TOTAL PROTEIN: 5.6 g/dL — AB (ref 6.5–8.1)

## 2015-09-26 LAB — PROTIME-INR
INR: 1.45 (ref 0.00–1.49)
Prothrombin Time: 17.7 seconds — ABNORMAL HIGH (ref 11.6–15.2)

## 2015-09-26 LAB — TSH: TSH: 1.332 u[IU]/mL (ref 0.350–4.500)

## 2015-09-26 LAB — GLUCOSE, CAPILLARY: GLUCOSE-CAPILLARY: 100 mg/dL — AB (ref 65–99)

## 2015-09-26 LAB — URINALYSIS W MICROSCOPIC (NOT AT ARMC)
BILIRUBIN URINE: NEGATIVE
GLUCOSE, UA: NEGATIVE mg/dL
KETONES UR: NEGATIVE mg/dL
LEUKOCYTES UA: NEGATIVE
NITRITE: NEGATIVE
PH: 6 (ref 5.0–8.0)
Protein, ur: NEGATIVE mg/dL
SPECIFIC GRAVITY, URINE: 1.033 — AB (ref 1.005–1.030)

## 2015-09-26 LAB — CBC WITH DIFFERENTIAL/PLATELET
Basophils Absolute: 0 10*3/uL (ref 0.0–0.1)
Basophils Relative: 0 %
Eosinophils Absolute: 0 10*3/uL (ref 0.0–0.7)
Eosinophils Relative: 0 %
HEMATOCRIT: 32.3 % — AB (ref 39.0–52.0)
HEMOGLOBIN: 10.7 g/dL — AB (ref 13.0–17.0)
LYMPHS ABS: 0.5 10*3/uL — AB (ref 0.7–4.0)
Lymphocytes Relative: 5 %
MCH: 29.6 pg (ref 26.0–34.0)
MCHC: 33.1 g/dL (ref 30.0–36.0)
MCV: 89.2 fL (ref 78.0–100.0)
MONO ABS: 1.1 10*3/uL — AB (ref 0.1–1.0)
MONOS PCT: 12 %
NEUTROS ABS: 7.3 10*3/uL (ref 1.7–7.7)
NEUTROS PCT: 82 %
Platelets: 127 10*3/uL — ABNORMAL LOW (ref 150–400)
RBC: 3.62 MIL/uL — ABNORMAL LOW (ref 4.22–5.81)
RDW: 13.2 % (ref 11.5–15.5)
WBC: 8.9 10*3/uL (ref 4.0–10.5)

## 2015-09-26 LAB — C-REACTIVE PROTEIN: CRP: 9.3 mg/dL — ABNORMAL HIGH (ref <1.0)

## 2015-09-26 LAB — MAGNESIUM: Magnesium: 1.9 mg/dL (ref 1.7–2.4)

## 2015-09-26 LAB — TROPONIN I
TROPONIN I: 0.05 ng/mL — AB (ref <0.031)
TROPONIN I: 0.03 ng/mL (ref <0.031)
TROPONIN I: 0.18 ng/mL — AB (ref <0.031)

## 2015-09-26 LAB — APTT: aPTT: 33 seconds (ref 24–37)

## 2015-09-26 LAB — MRSA PCR SCREENING: MRSA by PCR: POSITIVE — AB

## 2015-09-26 SURGERY — ECHOCARDIOGRAM, TRANSESOPHAGEAL
Anesthesia: Moderate Sedation

## 2015-09-26 MED ORDER — LIDOCAINE VISCOUS 2 % MT SOLN
OROMUCOSAL | Status: AC
  Filled 2015-09-26: qty 15

## 2015-09-26 MED ORDER — SIMVASTATIN 20 MG PO TABS
20.0000 mg | ORAL_TABLET | Freq: Every day | ORAL | Status: DC
  Administered 2015-09-26 – 2015-10-01 (×6): 20 mg via ORAL
  Filled 2015-09-26 (×6): qty 1

## 2015-09-26 MED ORDER — DEXTROSE 5 % IV SOLN
2.0000 g | Freq: Three times a day (TID) | INTRAVENOUS | Status: DC
  Administered 2015-09-26 – 2015-09-29 (×9): 2 g via INTRAVENOUS
  Filled 2015-09-26 (×13): qty 2

## 2015-09-26 MED ORDER — FENTANYL CITRATE (PF) 100 MCG/2ML IJ SOLN
INTRAMUSCULAR | Status: AC
  Filled 2015-09-26: qty 2

## 2015-09-26 MED ORDER — DIPHENHYDRAMINE HCL 50 MG/ML IJ SOLN
INTRAMUSCULAR | Status: AC
  Filled 2015-09-26: qty 1

## 2015-09-26 MED ORDER — SORBITOL 70 % SOLN
30.0000 mL | Freq: Every day | Status: DC | PRN
  Administered 2015-09-30: 30 mL via ORAL
  Filled 2015-09-26: qty 30

## 2015-09-26 MED ORDER — HEPARIN SODIUM (PORCINE) 5000 UNIT/ML IJ SOLN
5000.0000 [IU] | Freq: Three times a day (TID) | INTRAMUSCULAR | Status: DC
  Administered 2015-09-26 – 2015-10-02 (×17): 5000 [IU] via SUBCUTANEOUS
  Filled 2015-09-26 (×17): qty 1

## 2015-09-26 MED ORDER — SODIUM CHLORIDE 0.9 % IV SOLN
INTRAVENOUS | Status: DC
  Administered 2015-09-26: 20 mL/h via INTRAVENOUS

## 2015-09-26 MED ORDER — VANCOMYCIN HCL IN DEXTROSE 1-5 GM/200ML-% IV SOLN
1000.0000 mg | Freq: Two times a day (BID) | INTRAVENOUS | Status: DC
  Administered 2015-09-26 – 2015-09-27 (×4): 1000 mg via INTRAVENOUS
  Filled 2015-09-26 (×5): qty 200

## 2015-09-26 MED ORDER — MORPHINE SULFATE (PF) 2 MG/ML IV SOLN: 1.0000 mg | INTRAVENOUS | Status: DC | PRN

## 2015-09-26 MED ORDER — ACETAMINOPHEN 325 MG PO TABS
650.0000 mg | ORAL_TABLET | Freq: Four times a day (QID) | ORAL | Status: DC | PRN
Start: 2015-09-26 — End: 2015-09-26
  Administered 2015-09-26: 650 mg via ORAL
  Filled 2015-09-26: qty 2

## 2015-09-26 MED ORDER — BUTAMBEN-TETRACAINE-BENZOCAINE 2-2-14 % EX AERO
INHALATION_SPRAY | CUTANEOUS | Status: DC | PRN
  Administered 2015-09-26: 2 via TOPICAL

## 2015-09-26 MED ORDER — ACETAMINOPHEN 325 MG PO TABS
650.0000 mg | ORAL_TABLET | Freq: Four times a day (QID) | ORAL | Status: DC | PRN
  Administered 2015-09-26 – 2015-09-27 (×2): 650 mg via ORAL
  Filled 2015-09-26 (×2): qty 2

## 2015-09-26 MED ORDER — SODIUM CHLORIDE 0.9% FLUSH
3.0000 mL | Freq: Two times a day (BID) | INTRAVENOUS | Status: DC
  Administered 2015-09-28 – 2015-10-01 (×3): 3 mL via INTRAVENOUS

## 2015-09-26 MED ORDER — MIDAZOLAM HCL 5 MG/ML IJ SOLN
INTRAMUSCULAR | Status: AC
  Filled 2015-09-26: qty 2

## 2015-09-26 MED ORDER — ASPIRIN EC 81 MG PO TBEC
81.0000 mg | DELAYED_RELEASE_TABLET | Freq: Every day | ORAL | Status: DC
  Administered 2015-09-27 – 2015-10-02 (×6): 81 mg via ORAL
  Filled 2015-09-26 (×6): qty 1

## 2015-09-26 MED ORDER — ZOLPIDEM TARTRATE 5 MG PO TABS: 5.0000 mg | ORAL_TABLET | Freq: Every evening | ORAL | Status: DC | PRN

## 2015-09-26 MED ORDER — MUPIROCIN 2 % EX OINT
1.0000 "application " | TOPICAL_OINTMENT | Freq: Two times a day (BID) | CUTANEOUS | Status: AC
  Administered 2015-09-26 – 2015-09-30 (×10): 1 via NASAL
  Filled 2015-09-26 (×2): qty 22

## 2015-09-26 MED ORDER — MIDAZOLAM HCL 10 MG/2ML IJ SOLN
INTRAMUSCULAR | Status: DC | PRN
  Administered 2015-09-26: 1 mg via INTRAVENOUS
  Administered 2015-09-26 (×2): 2 mg via INTRAVENOUS

## 2015-09-26 MED ORDER — LIDOCAINE VISCOUS 2 % MT SOLN
OROMUCOSAL | Status: DC | PRN
  Administered 2015-09-26: 15 mL via OROMUCOSAL

## 2015-09-26 MED ORDER — HYDROCODONE-ACETAMINOPHEN 5-325 MG PO TABS: 1.0000 | ORAL_TABLET | ORAL | Status: DC | PRN

## 2015-09-26 MED ORDER — CHLORHEXIDINE GLUCONATE CLOTH 2 % EX PADS
6.0000 | MEDICATED_PAD | Freq: Every day | CUTANEOUS | Status: AC
  Administered 2015-09-27 – 2015-09-29 (×3): 6 via TOPICAL

## 2015-09-26 MED ORDER — METOPROLOL TARTRATE 12.5 MG HALF TABLET
12.5000 mg | ORAL_TABLET | Freq: Every day | ORAL | Status: DC
  Administered 2015-09-27 – 2015-10-02 (×6): 12.5 mg via ORAL
  Filled 2015-09-26 (×6): qty 1

## 2015-09-26 MED ORDER — FENTANYL CITRATE (PF) 100 MCG/2ML IJ SOLN
INTRAMUSCULAR | Status: DC | PRN
  Administered 2015-09-26 (×2): 25 ug via INTRAVENOUS

## 2015-09-26 NOTE — H&P (Signed)
CARDIOLOGY INPATIENT HISTORY AND PHYSICAL EXAMINATION NOTE  Patient ID: Nicholas Walls MRN: [id number], DOB/AGE: 1936/08/14   Admit date: 09/26/2015   Primary Physician: Lorelei Pont DO Primary Cardiologist: Cassell Clement MD  Reason for admission: abdominal pain  HPI: This is a 80 y.o.white male with no prior history of CAD but has history of non rheumatic calcific severe aortic stenosis s/p AVR w/ pericardial tissue valve (10/13/11 by Dr. Tyrone Sage), thoracic aortic aneurysm (2.9 cm), dyslipidemia, pulmonary emphysema, hiatal hernia, cholecystectomy and sleep apnea not on CPAP (mask does not fit well) who [facility] [place] [facility] with [place] [facility]. Patient was in usual state of health until yesterday when he was watching TV and suddenly he felt severe abdominal pain of sudden onset. Initially the ED physician thought he has pneumonia and obtained CTA for PE and CXR. The CXR was normal but the CT abdomen showed right sided renal infarction. Patient has hiatal hernia and had EGD performed in the past. There is no prior history of infective endocarditis.  Patient denied hematuria, urgency, frequency, pyuria, change in color or odor of the urine recently.  No recent infection or foley's placement. He denied any NSAID use. He had recent laryngitis and sinus infection over the weekend and received steroid short and got amoxicillin.  In the ED, patient was hemodynamically stable. White count 11.2, hb 11.7. CT pulmonary arteriogram showed 19 mm pretracheal lymph node of undetermined etiology. There was a concern about upper kidney pole right sided renal infarction. There was pulmonary emphysema. He also received CT abdomen pelvis which showed renal cysts and also got CT head for weakness which was negative for any acute disorder. BNP was 150.  inr 1.2. There is circumscribed mass in the lower pole of the kidney with density of 30 HU.  His bilirubin was elevated to 1.8 and  alkaline phosphatase 132 (was also elevated). His PCP recently stopped his simvastatin due to elevated alkaline phosphatase. In the ED, he received vancomycin and levofloxacin.  His bilirubin was normal few days ago. Urinarlysis did not show hematuria or pyuria.   Problem List: Past Medical History  Diagnosis Date  . Hyperlipidemia   . Aortic stenosis, severe     s/p AVR with pericardial tissue valve Feb 2013 per Dr. Tyrone Sage  . Osteoarthritis      End-stage osteoarthritis--left knee  . History of hiatal hernia   . History of anemia of chronic disease   . Hyponatremia     Mild hyponatremia, allowed to self-correct, asymptomatic  . Aortic valve defect   . Hypertension   . Bradycardia by electrocardiogram   . Sleep apnea      4 yrs ago     no cpap  . Full dentures     Past Surgical History  Procedure Laterality Date  . Cholecystectomy    . Total knee arthroplasty      bilateral  . Joint replacement      bil knees  . Cervical disc surgery    . Cardiac catheterization    . Aortic valve replacement  10/13/2011    Procedure: AORTIC VALVE REPLACEMENT (AVR);  Surgeon: [name], MD;  Location: [facility] OR;  Service: Open Heart Surgery;  Laterality: N/A;  . Carpal tunnel release  06/01/2012    Procedure: CARPAL TUNNEL RELEASE;  Surgeon: Wyn Forster., MD;  Location: [facility];  Service: Orthopedics;  Laterality: Right;  . Ulnar nerve transposition  06/01/2012    Procedure: ULNAR NERVE  DECOMPRESSION/TRANSPOSITION;  Surgeon: Wyn Forster., MD;  Location: [facility];  Service: Orthopedics;  Laterality: Right;  right ulnar nerve decompression     Allergies:  Allergies  Allergen Reactions  . Ultram [Tramadol Hcl]     "seeing things"     Home Medications Current Facility-Administered Medications  Medication Dose Route Frequency Provider Last Rate Last Dose  . acetaminophen (TYLENOL) tablet 650 mg  650 mg Oral Q6H PRN Joellyn Rued, MD    650 mg at 09/26/15 0404  . aspirin EC tablet 81 mg  81 mg Oral Daily Joellyn Rued, MD      . ceFEPIme (MAXIPIME) 2 g in dextrose 5 % 50 mL IVPB  2 g Intravenous 3 times per day Lars Masson, MD      . heparin injection 5,000 Units  5,000 Units Subcutaneous 3 times per day Joellyn Rued, MD      . metoprolol tartrate (LOPRESSOR) tablet 12.5 mg  12.5 mg Oral Daily Joellyn Rued, MD      . simvastatin (ZOCOR) tablet 20 mg  20 mg Oral QHS Joellyn Rued, MD      . sodium chloride flush (NS) 0.9 % injection 3 mL  3 mL Intravenous Q12H Joellyn Rued, MD   3 mL at 09/26/15 0329  . sorbitol 70 % solution 30 mL  30 mL Oral Daily PRN Joellyn Rued, MD      . vancomycin (VANCOCIN) IVPB 1000 mg/200 mL premix  1,000 mg Intravenous Q12H Lars Masson, MD      . zolpidem (AMBIEN) tablet 5 mg  5 mg Oral QHS PRN Joellyn Rued, MD         Family History  Problem Relation Age of Onset  . Throat cancer Mother   . Heart failure Father   . Lung cancer Father   . Hypertension Sister      Social History   Social History  . Marital Status: Married    Spouse Name: N/A  . Number of Children: N/A  . Years of Education: N/A   Occupational History  . Not on file.   Social History Main Topics  . Smoking status: Former Smoker    Types: Cigarettes    Quit date: 09/01/1981  . Smokeless tobacco: Never Used  . Alcohol Use: 1.2 oz/week    2 Cans of beer per week     Comment: weekly  . Drug Use: No  . Sexual Activity: Not on file   Other Topics Concern  . Not on file   Social History Narrative     Review of Systems: General: fever today 101 F and chills negative for night sweats or weight changes.  Cardiovascular: negative for dyspnea on exertion, edema, orthopnea, palpitations, paroxysmal nocturnal dyspnea or shortness of breath  Dermatological: negative for rash Respiratory: negative for cough or wheezing Urologic: negative for hematuria Abdominal: constipation negative for  nausea, vomiting, diarrhea, bright red blood per rectum, melena, or hematemesis Neurologic: negative for visual changes, syncope, or dizziness Endocrine: no diabetes, no hypothyroidism Immunological: no lymph adenopathy Psych: non homicidal/suicidal  Physical Exam: Vitals: BP 106/59 mmHg  Pulse 57  Temp(Src) 99.1 F (37.3 C) (Oral)  Resp 13  Ht 6\' 2"  (1.88 m)  Wt 90.402 kg (199 lb 4.8 oz)  BMI 25.58 kg/m2  SpO2 96% General: not in acute distress Neck: JVP flat, neck not supple due to prior surgeries Heart: regular rate and rhythm, S1, S2,  grade II/VI humming soft ejection systolic murmur best heart at PMI and third intercostal space on left sternal border. No right sided murmurs. Normal split.   Lungs: CTAB  GI: tender in the RUQ, non distended, bowel sounds present Extremities: no edema Neuro: AAO x 3  Psych: normal affect, no anxiety   Labs:   Results for orders placed or performed during the hospital encounter of 09/26/15 (from the past 24 hour(s))  C-reactive protein     Status: Abnormal   Collection Time: 09/26/15  3:35 AM  Result Value Ref Range   CRP 9.3 (H) <1.0 mg/dL  Comprehensive metabolic panel     Status: Abnormal   Collection Time: 09/26/15  3:35 AM  Result Value Ref Range   Sodium 137 135 - 145 mmol/L   Potassium 4.2 3.5 - 5.1 mmol/L   Chloride 102 101 - 111 mmol/L   CO2 26 22 - 32 mmol/L   Glucose, Bld 109 (H) 65 - 99 mg/dL   BUN 15 6 - 20 mg/dL   Creatinine, Ser 1.61 0.61 - 1.24 mg/dL   Calcium 8.6 (L) 8.9 - 10.3 mg/dL   Total Protein 5.6 (L) 6.5 - 8.1 g/dL   Albumin 2.8 (L) 3.5 - 5.0 g/dL   AST 33 15 - 41 U/L   ALT 32 17 - 63 U/L   Alkaline Phosphatase 124 38 - 126 U/L   Total Bilirubin 1.5 (H) 0.3 - 1.2 mg/dL   GFR calc non Af Amer >60 >60 mL/min   GFR calc Af Amer >60 >60 mL/min   Anion gap 9 5 - 15  Magnesium     Status: None   Collection Time: 09/26/15  3:35 AM  Result Value Ref Range   Magnesium 1.9 1.7 - 2.4 mg/dL  CBC WITH DIFFERENTIAL      Status: Abnormal   Collection Time: 09/26/15  3:35 AM  Result Value Ref Range   WBC 8.9 4.0 - 10.5 K/uL   RBC 3.62 (L) 4.22 - 5.81 MIL/uL   Hemoglobin 10.7 (L) 13.0 - 17.0 g/dL   HCT 09.6 (L) 04.5 - 40.9 %   MCV 89.2 78.0 - 100.0 fL   MCH 29.6 26.0 - 34.0 pg   MCHC 33.1 30.0 - 36.0 g/dL   RDW 81.1 91.4 - 78.2 %   Platelets 127 (L) 150 - 400 K/uL   Neutrophils Relative % 82 %   Neutro Abs 7.3 1.7 - 7.7 K/uL   Lymphocytes Relative 5 %   Lymphs Abs 0.5 (L) 0.7 - 4.0 K/uL   Monocytes Relative 12 %   Monocytes Absolute 1.1 (H) 0.1 - 1.0 K/uL   Eosinophils Relative 0 %   Eosinophils Absolute 0.0 0.0 - 0.7 K/uL   Basophils Relative 0 %   Basophils Absolute 0.0 0.0 - 0.1 K/uL  Protime-INR     Status: Abnormal   Collection Time: 09/26/15  3:35 AM  Result Value Ref Range   Prothrombin Time 17.7 (H) 11.6 - 15.2 seconds   INR 1.45 0.00 - 1.49  APTT     Status: None   Collection Time: 09/26/15  3:35 AM  Result Value Ref Range   aPTT 33 24 - 37 seconds  Troponin I     Status: Abnormal   Collection Time: 09/26/15  3:35 AM  Result Value Ref Range   Troponin I 0.05 (H) <0.031 ng/mL  Urinalysis with microscopic (not at [facility])     Status: Abnormal   Collection Time: 09/26/15  3:42  AM  Result Value Ref Range   Color, Urine YELLOW YELLOW   APPearance CLEAR CLEAR   Specific Gravity, Urine 1.033 (H) 1.005 - 1.030   pH 6.0 5.0 - 8.0   Glucose, UA NEGATIVE NEGATIVE mg/dL   Hgb urine dipstick TRACE (A) NEGATIVE   Bilirubin Urine NEGATIVE NEGATIVE   Ketones, ur NEGATIVE NEGATIVE mg/dL   Protein, ur NEGATIVE NEGATIVE mg/dL   Nitrite NEGATIVE NEGATIVE   Leukocytes, UA NEGATIVE NEGATIVE   WBC, UA 0-5 0 - 5 WBC/hpf   RBC / HPF 0-5 0 - 5 RBC/hpf   Bacteria, UA RARE (A) NONE SEEN   Squamous Epithelial / LPF 0-5 (A) NONE SEEN     Radiology/Studies: No results found. CXR today outside records- no acute cardiopulmonary disease  EKG: 09/20/2015 - sinus bradycardia, PACs, normal axis, no ST/T  wave changes  Echo: 09/12/2015 - normal LV size, normal LV thickness, grade 2 diastolic dysfunction, mean prosthetic aortic valve gradient was 14, peak 26, DI = 0.43  Cardiac cath: 2013 Normal coronary arteries  Medical decision making:  Discussed care with the patient Discussed care with the ED physician on the phone Reviewed labs and imaging personally Reviewed prior records  ASSESSMENT AND PLAN:  This is a 80 y.o. white male with no prior history of CAD but has history of bicuspid aortic valve related severe aortic stenosis s/p AVR w/ pericardial tissue valve (10/13/11 by Dr. Tyrone Sage), dyslipidemia, pulmonary emphysema, hiatal hernia, cholecystectomy and sleep apnea not on CPAP (mask does not fit well) who presented to [facility] [place] [facility] with [facility].   Active Problems:   Sleep apnea   S/P aortic valve replacement with bioprosthetic valve   Pure hypercholesterolemia   Anemia   Renal infarction [facility])   Lymph node enlargement   Renal mass   Alkaline phosphatase elevation   Systolic murmur   Other specified fever  Renal infarction with fevers Concern for infective endocarditis. NPO post midnight for possible TEE. TEE findings can lag. EKG if chest pain prn Obtain surface echocardiogram  Obtaining blood cultures x 2  Checking UA and urine culture  Renal mass Likely cyst per radiologist report Outpatient follow up, UA has been benign without hematuria  Enlarged Lymphnode  Pretracheal lymphnode 19 mm, outpatient follow up with repeat imaging  Alkaline phosphatemia and elevated bilirubin Checking leukocyte specific alkaline phosphatase Repeat CMP, if not resolved, will need biliary tree evaluation with MRCP since he had cholecystectomy in the past  Renal cysts Outpatient follow up with PCP  Fevers Started on vancomycin and cefepime empirically after obtaining blood cultures.  Received one dose outside hospital   RUQ abdominal pain Resolved  with prn meds norco ordered   Signed, Joellyn Rued, MD MS 09/26/2015, 5:01 AM

## 2015-09-26 NOTE — Progress Notes (Signed)
Patient ID: Nicholas Walls, male   DOB: 09/11/1935, 80 y.o.   MRN: [id number]      [address]       [place] [address]             [phone]        Nicholas Walls [facility] Medical Record #[id number] Date of Birth: 02-20-1936  Referring: Dr Patty Sermons Primary Care: [facility], DO  Chief Complaint:   Chills and back pain  History of Present Illness:     Patient  had a past history of severe aortic stenosis. He underwent aortic valve replacement with a pericardial tissue 10/13/11 by me .  [name]. [name] and was not  having any  symptoms. 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 The patient has not been experiencing any dizziness. Since last visit he has been doing well. He stays physically active. His arthritis is no worse. He takes 2 Aleve at bedtime. He is also on a baby aspirin.   The patient had an update of his echocardiogram on 09/12/15 which showed normal left ventricular systolic function with grade 2 diastolic dysfunction. There was a small gradient across the prosthetic aortic valve with a peak pressure of 26 and mean pressure of 14.  He noted a recent sudden shaking chills and fever last week . No localizing signs or symptoms. No symptoms of prostatitis.  Betsey Amen he noted  while was watching TV sudden  severe abdominal pain of sudden onset. He came to ER  Initially the ED physician thought he has pneumonia . A  CTA for PE and CXR was obtained . The CXR was normal but the CT abdomen showed right sided renal infarction. Patient has hiatal hernia and had EGD performed in the past. There is no prior history of infective endocarditis.  Patient denied hematuria, urgency, frequency, pyuria, change in color or odor of the urine recently. No recent infection or foley's placement. He denied any NSAID use. He  had recent laryngitis and sinus infection over the weekend and received steroid short and got amoxicillin.   In the ED, patient was hemodynamically stable. White count 11.2, hb 11.7. CT pulmonary arteriogram showed 19 mm pretracheal lymph node of undetermined etiology. There was a concern about upper kidney pole right sided renal infarction. There was pulmonary emphysema. He also received CT abdomen pelvis which showed renal cysts and also got CT head for weakness which was negative for any acute disorder. BNP was 150. inr 1.2. There is circumscribed mass in the lower pole of the kidney with density of 30 HU.  His bilirubin was elevated to 1.8 and alkaline phosphatase 132 (was also elevated). His PCP recently stopped his simvastatin due to elevated alkaline phosphatase.  Before his admission he had been started on amoxicillin for question of sinusitis, he took only 4-5 doses   In the ED, he received vancomycin and levofloxacin.   His bilirubin was normal few days ago. Urinarlysis did not show hematuria or pyuria.  No report from blood cultures yet , done in [place]  Family reports the patient has had some mild confusion episodically over the last several days.  Should be noted that the patient's cardiac surgery to place in February 2013, the use of the recalled heater cooler in the cone system did not start until June 2013.  Current  Activity/ Functional Status: Patient is independent with mobility/ambulation, transfers, ADL's, IADL's.   Zubrod Score: At the time of surgery this patient's most appropriate activity status/level should be described as: [x]     0    Normal activity, no symptoms []     1    Restricted in physical strenuous activity but ambulatory, able to do out light work []     2    Ambulatory and capable of self care, unable to do work activities, up and about                 more than 50%  Of the time                            []     3    Only limited self care, in bed  greater than 50% of waking hours []     4    Completely disabled, no self care, confined to bed or chair []     5    Moribund  Past Medical History  Diagnosis Date  . Hyperlipidemia   . Aortic stenosis, severe     s/p AVR with pericardial tissue valve Feb 2013 per Dr. Tyrone Sage  . Osteoarthritis      End-stage osteoarthritis--left knee  . History of hiatal hernia   . History of anemia of chronic disease   . Hyponatremia     Mild hyponatremia, allowed to self-correct, asymptomatic  . Aortic valve defect   . Hypertension   . Bradycardia by electrocardiogram   . Sleep apnea      4 yrs ago     no cpap  . Full dentures     Past Surgical History  Procedure Laterality Date  . Cholecystectomy    . Total knee arthroplasty      bilateral  . Joint replacement      bil knees  . Cervical disc surgery    . Cardiac catheterization    . Aortic valve replacement  10/13/2011    Procedure: AORTIC VALVE REPLACEMENT (AVR);  Surgeon: [name], MD;  Location[facility] OR;  Service: Open Heart Surgery;  Laterality: N/A;  . Carpal tunnel release  06/01/2012    Procedure: CARPAL TUNNEL RELEASE;  Surgeon: Wyn Forster., MD;  Location: [facility];  Service: Orthopedics;  Laterality: Right;  . Ulnar nerve transposition  06/01/2012    Procedure: ULNAR NERVE DECOMPRESSION/TRANSPOSITION;  Surgeon: Wyn Forster., MD;  Location: [facility];  Service: Orthopedics;  Laterality: Right;  right ulnar nerve decompression    History  Smoking status  . Former Smoker  . Types: Cigarettes  . Quit date: 09/01/1981  Smokeless tobacco  . Never Used    History  Alcohol Use  . 1.2 oz/week  . 2 Cans of beer per week    Comment: weekly    Social History   Social History  . Marital Status: Married    Spouse Name: N/A  . Number of Children: N/A  . Years of Education: N/A   Occupational History  . Not on file.   Social History Main Topics  . Smoking status: Former  Smoker    Types: Cigarettes    Quit date: 09/01/1981  . Smokeless tobacco: Never Used  . Alcohol Use: 1.2 oz/week    2 Cans of beer per week     Comment: weekly  . Drug Use: No  . Sexual Activity: Not  on file   Other Topics Concern  . Not on file   Social History Narrative    Allergies  Allergen Reactions  . Ultram [Tramadol Hcl]     "seeing things"    Current Facility-Administered Medications  Medication Dose Route Frequency Provider Last Rate Last Dose  . acetaminophen (TYLENOL) tablet 650 mg  650 mg Oral Q6H PRN Lars Masson, MD      . aspirin EC tablet 81 mg  81 mg Oral Daily Joellyn Rued, MD   81 mg at 09/26/15 1000  . ceFEPIme (MAXIPIME) 2 g in dextrose 5 % 50 mL IVPB  2 g Intravenous 3 times per day Lars Masson, MD   2 g at 09/26/15 0513  . Chlorhexidine Gluconate Cloth 2 % PADS 6 each  6 each Topical Q0600 Joellyn Rued, MD      . heparin injection 5,000 Units  5,000 Units Subcutaneous 3 times per day Joellyn Rued, MD      . metoprolol tartrate (LOPRESSOR) tablet 12.5 mg  12.5 mg Oral Daily Joellyn Rued, MD   12.5 mg at 09/26/15 1000  . mupirocin ointment (BACTROBAN) 2 % 1 application  1 application Nasal BID Joellyn Rued, MD   1 application at 09/26/15 1114  . simvastatin (ZOCOR) tablet 20 mg  20 mg Oral QHS Joellyn Rued, MD      . sodium chloride flush (NS) 0.9 % injection 3 mL  3 mL Intravenous Q12H Joellyn Rued, MD   3 mL at 09/26/15 0329  . sorbitol 70 % solution 30 mL  30 mL Oral Daily PRN Joellyn Rued, MD      . vancomycin (VANCOCIN) IVPB 1000 mg/200 mL premix  1,000 mg Intravenous Q12H Lars Masson, MD   1,000 mg at 09/26/15 0603  . zolpidem (AMBIEN) tablet 5 mg  5 mg Oral QHS PRN Joellyn Rued, MD        Prescriptions prior to admission  Medication Sig Dispense Refill Last Dose  . amoxicillin (AMOXIL) 500 MG capsule Take 500 mg by mouth 3 (three) times daily.   09/24/2015  . fish oil-omega-3 fatty acids 1000 MG capsule  Take 3 g by mouth daily.    09/24/2015  . metoprolol tartrate (LOPRESSOR) 25 MG tablet Take 12.5 mg by mouth daily.   09/24/2015 at 2000  . naproxen sodium (ANAPROX) 220 MG tablet Take 220 mg by mouth daily.    09/24/2015 at Unknown time  . simvastatin (ZOCOR) 20 MG tablet Take 1 tablet (20 mg total) by mouth at bedtime. 90 tablet 3 09/24/2015  . aspirin 81 MG tablet Take 81 mg by mouth daily. Reported on 09/26/2015   Not Taking at Unknown time    Family History  Problem Relation Age of Onset  . Throat cancer Mother   . Heart failure Father   . Lung cancer Father   . Hypertension Sister      Review of Systems:      Cardiac Review of Systems: Y or N  Chest Pain [   n ]  Resting SOB [  n ] Exertional SOB  [  y]  Orthopnea [ n ]   Pedal Edema [ n]    Palpitations Milo.Brash  ] Syncope  [ n ]   Presyncope [ n  ]  General Review of Systems: [Y] = yes [  ]=no Constitional: recent weight change [n  ]; anorexia [  ]; fatigue [  y]; nausea [  ]; night sweats [  ]; fever [ y ]; or chills [ y ]                                                               Dental: poor dentition[ no teeth had bleeding from roof of mouth summer 2016 ]; Last Dentist visit:   Eye : blurred vision [  ]; diplopia [   ]; vision changes [  ];  Amaurosis fugax[  ]; Resp: cough [  ];  wheezing[  ];  hemoptysis[  ]; shortness of breath[  ]; paroxysmal nocturnal dyspnea[  ]; dyspnea on exertion[  ]; or orthopnea[  ];  GI:  gallstones[  ], vomiting[  ];  dysphagia[  ]; melena[  ];  hematochezia [  ]; heartburn[  ];   Hx of  Colonoscopy[  ]; GU: kidney stones [  ]; hematuria[  ];   dysuria [  ];  nocturia[  ];  history of     obstruction [  ]; urinary frequency [  ]             Skin: rash, swelling[  ];, hair loss[  ];  peripheral edema[  ];  or itching[  ]; Musculosketetal: myalgias[  ];  joint swelling[  ];  joint erythema[  ];  joint pain[  ];  back pain[y  ];  Heme/Lymph: bruising[  ];  bleeding[  ];  anemia[ y ];  Neuro: TIA[ n ];   headaches[n];  stroke[  n];  vertigo[n  ];  seizures[  n];   paresthesias[  n];  difficulty walking[n  ];  Psych:depression[  ]; anxiety[  ];  Endocrine: diabetes[ n ];  thyroid dysfunction[ n ];  Immunizations: Flu [  ]; Pneumococcal[  ];  Other:  Physical Exam: BP 127/62 mmHg  Pulse 74  Temp(Src) 99.3 F (37.4 C) (Oral)  Resp 13  Ht 6\' 2"  (1.88 m)  Wt 199 lb 4.8 oz (90.402 kg)  BMI 25.58 kg/m2  SpO2 96%   General appearance: alert, cooperative, appears older than stated age and no distress Head: Normocephalic, without obvious abnormality, atraumatic Neck: no adenopathy, no carotid bruit, no JVD, supple, symmetrical, trachea midline and thyroid not enlarged, symmetric, no tenderness/mass/nodules Lymph nodes: Cervical, supraclavicular, and axillary nodes normal. Resp: clear to auscultation bilaterally Back: symmetric, no curvature. ROM normal. No CVA tenderness. Cardio: systolic murmur: early systolic 2/6, crescendo at 2nd left intercostal space GI: soft, non-tender; bowel sounds normal; no masses,  no organomegaly Genitalia: Normal male, notes some tenderness in the right groin, no palpable masses Extremities: extremities normal, atraumatic, no cyanosis or edema, Homans sign is negative, no sign of DVT and No evidence of peripheral emboli or subcutaneous nodules Neurologic: Grossly normal 2+ DP and PT pulses bilaterally  Diagnostic Studies & Laboratory data:     Recent Radiology Findings:   No results found.    CT scan done in [place]  Recent Lab Findings: Lab Results  Component Value Date   WBC 8.9 09/26/2015   HGB 10.7* 09/26/2015   HCT 32.3* 09/26/2015   PLT 127* 09/26/2015   GLUCOSE 109* 09/26/2015   CHOL 134 09/20/2015   TRIG 82 09/20/2015   HDL 33* 09/20/2015   LDLCALC 85 09/20/2015  ALT 32 09/26/2015   AST 33 09/26/2015   NA 137 09/26/2015   K 4.2 09/26/2015   CL 102 09/26/2015   CREATININE 1.14 09/26/2015   BUN 15 09/26/2015   CO2 26 09/26/2015     TSH 1.332 09/26/2015   INR 1.45 09/26/2015   HGBA1C 4.9 10/09/2011    TEE: results done today There were no immediate complications.  Findings:  1. LEFT VENTRICLE: The left ventricular wall thickness is mildly increased. The left ventricular cavity is normal in size. Wall motion is normal. LVEF is 55-60%.  2. RIGHT VENTRICLE: The right ventricle is normal in structure and function without any thrombus or masses.  3. LEFT ATRIUM: The left atrium is dilated in size without any thrombus or masses. There is not spontaneous echo contrast ("smoke") in the left atrium consistent with a low flow state.  4. LEFT ATRIAL APPENDAGE: The left atrial appendage is free of any thrombus or masses. The appendage has single lobes. Pulse doppler indicates moderate flow in the appendage.  5. ATRIAL SEPTUM: The atrial septum appears intact and is free of thrombus and/or masses. There is no evidence for interatrial shunting by color doppler.  6. RIGHT ATRIUM: The right atrium is normal in size and function without any thrombus or masses.  7. MITRAL VALVE: The mitral valve is mildly thickened in structure and function with Mild regurgitation. There were no obvious vegetations or stenosis.  8. AORTIC VALVE: The aortic valve is a bioprosthesis which is well-seated. There is no paravalvular leak or sign of annular abscess. There is trivial regurgitation. There is a 2.5 cm x 0.5 cm filamentous, sessile vegetation noted on the aortic leaflets extending into the ascending aorta.  9. TRICUSPID VALVE: The tricuspid valve is normal in structure and function with trivial regurgitation. There were no vegetations or stenosis  10. PULMONIC VALVE: The pulmonic valve is normal in structure and function with no regurgitation. There were no vegetations or stenosis.  11. AORTIC ARCH, ASCENDING AND DESCENDING AORTA: There was grade 2 Myrtis Ser et. Al, 1992) atherosclerosis of the proximal  descending aorta.  12. PULMONARY VEINS: Anomalous pulmonary venous return was not noted.  13. PERICARDIUM: The pericardium appeared normal and non-thickened. There is no pericardial effusion.  IMPRESSION:   1. 2.5 cm x 0.5 cm filamentous, sessile vegetation attached to the bioprosthetic aortic valve. Trivial AI. 2. Mild miltral regurgitation. 3. No LAA thrombus 4. Normal LV function   Assessment / Plan:   1 Status post aortic valve replacement with a pericardial tissue valve for severe aortic stenosis 2013, now presents with chill, question of renal infarct and filamentous vegetation on the bioprosthetic valve without evidence of perivalvular leak, aortic insufficiency. Blood cultures are pending, urine is clear, plan to treat for endocarditis. Currently no acute indication for surgical intervention. No significant aortic regurgitation or evidence of congestive heart failure, there is no evidence of valve dehiscence perforation or rupture or fistula or perivalvular abscess, these complications will need to be closely monitored for. Follow-up echocardiogram will need to be done as increasing vegetation size in spite of antimicrobial therapy or  persistent fever/ bacteremia in spite of treatment are indications for surgical intervention. 2 / continued involvement of multidisciplinary approach with involvement of entire" Endocarditis Team" including cardiac surgery , cardiology, and infectious disease. 3/obtain infectious disease consultation 4/obtain blood culture results from [facility] 5/with the patient's date of surgery February 2013, the issue of recalled heater cooler devices used it [facility] were not used until June  2013     I  spent 40 minutes counseling the patient face to face and 50% or more the  time was spent in counseling and coordination of care. The total time spent in the appointment was 60 minutes.    [name] MD      [address] [place] [place] [address] Office [phone]   Beeper [phone]  09/26/2015 5:11 PM

## 2015-09-26 NOTE — Progress Notes (Signed)
Patient Name: Nicholas Walls Date of Encounter: 09/26/2015  Active Problems:   Sleep apnea   S/P aortic valve replacement with bioprosthetic valve   Pure hypercholesterolemia   Anemia   Renal infarction [facility])   Lymph node enlargement   Renal mass   Alkaline phosphatase elevation   Systolic murmur   Other specified fever   Length of Stay: 0  SUBJECTIVE  No chest pain, no fever, complains of headache and RLQ pain.  CURRENT MEDS . aspirin EC  81 mg Oral Daily  . ceFEPime (MAXIPIME) IV  2 g Intravenous 3 times per day  . Chlorhexidine Gluconate Cloth  6 each Topical Q0600  . heparin  5,000 Units Subcutaneous 3 times per day  . metoprolol tartrate  12.5 mg Oral Daily  . mupirocin ointment  1 application Nasal BID  . simvastatin  20 mg Oral QHS  . sodium chloride flush  3 mL Intravenous Q12H  . vancomycin  1,000 mg Intravenous Q12H    OBJECTIVE  Filed Vitals:   09/26/15 0350 09/26/15 0603 09/26/15 0800 09/26/15 0854  BP: 106/59 101/49 115/57   Pulse: 57  60   Temp: 99.1 F (37.3 C)   99.6 F (37.6 C)  TempSrc: Oral   Oral  Resp: Height:      Weight: 199 lb 4.8 oz (90.402 kg)     SpO2: 96%  93%     Intake/Output Summary (Last 24 hours) at 09/26/15 1200 Last data filed at 09/26/15 1146  Gross per 24 hour  Intake    250 ml  Output   1250 ml  Net  -1000 ml   Filed Weights   09/26/15 0159 09/26/15 0350  Weight: 199 lb 14.4 oz (90.674 kg) 199 lb 4.8 oz (90.402 kg)    PHYSICAL EXAM  General: Pleasant, NAD. Neuro: Alert and oriented X 3. Moves all extremities spontaneously. Psych: Normal affect. HEENT:  Normal  Neck: Supple without bruits or JVD. Lungs:  Resp regular and unlabored, CTA. Heart: RRR no s3, s4, or murmurs. Abdomen: Soft, non-tender, non-distended, BS + x 4.  Extremities: No clubbing, cyanosis or edema. DP/PT/Radials 2+ and equal bilaterally.  Accessory Clinical Findings  CBC  Recent Labs  09/26/15 0335  WBC 8.9    NEUTROABS 7.3  HGB 10.7*  HCT 32.3*  MCV 89.2  PLT 127*   Basic Metabolic Panel  Recent Labs  09/26/15 0335  NA 137  K 4.2  CL 102  CO2 26  GLUCOSE 109*  BUN 15  CREATININE 1.14  CALCIUM 8.6*  MG 1.9   Liver Function Tests  Recent Labs  09/26/15 0335  AST 33  ALT 32  ALKPHOS 124  BILITOT 1.5*  PROT 5.6*  ALBUMIN 2.8*    Recent Labs  09/26/15 0335 09/26/15 1014  TROPONINI 0.05* 0.03    Recent Labs  09/26/15 0335  TSH 1.332   TELE: SR    ASSESSMENT AND PLAN  This is a 80 y.o. white male with no prior history of CAD but has history of bicuspid aortic valve related severe aortic stenosis s/p AVR w/ pericardial tissue valve (10/13/11 by Dr. Tyrone Sage), dyslipidemia, pulmonary emphysema, hiatal hernia, cholecystectomy and sleep apnea not on CPAP (mask does not fit well) who [facility].  Active Problems:   Sleep apnea  S/P aortic valve replacement with bioprosthetic valve  Pure hypercholesterolemia  Anemia  Renal infarction [facility])  Lymph  node enlargement  Renal mass  Alkaline phosphatase elevation  Systolic murmur  Other specified fever  Renal infarction with fevers Concern for infective endocarditis. NPO post midnight for possible TEE today. TEE findings can lag. EKG if chest pain prn Obtain surface echocardiogram  Obtaining blood cultures x 2  Checking UA and urine culture  Renal mass Likely cyst per radiologist report Outpatient follow up, UA has been benign without hematuria  Enlarged Lymphnode  Pretracheal lymphnode 19 mm, outpatient follow up with repeat imaging  Alkaline phosphatemia and elevated bilirubin Checking leukocyte specific alkaline phosphatase Repeat CMP, if not resolved, will need biliary tree evaluation with MRCP since he had cholecystectomy in the past  Renal cysts Outpatient follow up with PCP  Fevers Started on vancomycin and cefepime  empirically after obtaining blood cultures.  Received one dose outside hospital   RUQ abdominal pain Resolved with prn meds norco ordered   Signed, Lars Masson MD, [facility] 09/26/2015

## 2015-09-26 NOTE — Progress Notes (Signed)
ANTIBIOTIC CONSULT NOTE - INITIAL  Pharmacy Consult for Vancomycin and Cefepime  Indication: r/o endicarditis  Allergies  Allergen Reactions  . Ultram [Tramadol Hcl]     "seeing things"    Patient Measurements: Height: 6\' 2"  (188 cm) Weight: 199 lb 14.4 oz (90.674 kg) IBW/kg (Calculated) : 82.2  Vital Signs: Temp: 98.2 F (36.8 C) (01/25 0159) Temp Source: Oral (01/25 0159) BP: 112/61 mmHg (01/25 0159) Pulse Rate: 61 (01/25 0159) Intake/Output from previous day:   Intake/Output from this shift:    Labs: WBC  11.2 Hgb  11.7 Hct  34.2 Plt  160  SCr  1.12 No results for input(s): WBC, HGB, PLT, LABCREA, CREATININE in the last 72 hours. Estimated Creatinine Clearance: 72.7 mL/min (by C-G formula based on Cr of 0.99). No results for input(s): VANCOTROUGH, VANCOPEAK, VANCORANDOM, GENTTROUGH, GENTPEAK, GENTRANDOM, TOBRATROUGH, TOBRAPEAK, TOBRARND, AMIKACINPEAK, AMIKACINTROU, AMIKACIN in the last 72 hours.   Microbiology: No results found for this or any previous visit (from the past 720 hour(s)).  Medical History: Past Medical History  Diagnosis Date  . Hyperlipidemia   . Aortic stenosis, severe     s/p AVR with pericardial tissue valve Feb 2013 per Dr. Tyrone Sage  . Osteoarthritis      End-stage osteoarthritis--left knee  . History of hiatal hernia   . History of anemia of chronic disease   . Hyponatremia     Mild hyponatremia, allowed to self-correct, asymptomatic  . Aortic valve defect   . Hypertension   . Bradycardia by electrocardiogram   . Sleep apnea      4 yrs ago     no cpap  . Full dentures     Medications:  Lopressor  Zocor  Fish Oil    Assessment: 80 y.o. male with abdominal pain, possible endocarditis, for empiric antibiotics.  Vancomycin 1 g IV given at [address]30 at [facility]  Goal of Therapy:  Vancomycin trough level 15-20 mcg/ml  Plan:  Vancomycin 1 g IV q12h Cefepime 2 g IV q8h  Diara Chaudhari, Gary Fleet 09/26/2015,2:53 AM

## 2015-09-26 NOTE — Progress Notes (Signed)
  Echocardiogram Echocardiogram Transesophageal has been performed.  Arvil Chaco 09/26/2015, 3:35 PM

## 2015-09-26 NOTE — CV Procedure (Addendum)
TRANSESOPHAGEAL ECHOCARDIOGRAM (TEE) NOTE  INDICATIONS: bioprosthetic valve infective endocarditis  PROCEDURE:   Informed consent was obtained prior to the procedure. The risks, benefits and alternatives for the procedure were discussed and the patient comprehended these risks.  Risks include, but are not limited to, cough, sore throat, vomiting, nausea, somnolence, esophageal and stomach trauma or perforation, bleeding, low blood pressure, aspiration, pneumonia, infection, trauma to the teeth and death.    After a procedural time-out, the patient was given 2 mg versed and 25 mcg fentanyl for moderate sedation - however, it was discovered that his right forearm IV had infiltrated. This was removed and pressure was held. A new IV was started by the nurse in the left forearm. The patient was given an additional 3 mg versed and 25 mcg fentanyl.  The oropharynx was anesthetized 10 cc of topical 1% viscous lidocaine and 2 cetacaine sprays.  The transesophageal probe was inserted in the esophagus and stomach without difficulty and multiple views were obtained.  The patient was kept under observation until the patient left the procedure room.  The patient left the procedure room in stable condition.   Agitated microbubble saline contrast was not administered.  COMPLICATIONS:    There were no immediate complications.  Findings:  1. LEFT VENTRICLE: The left ventricular wall thickness is mildly increased.  The left ventricular cavity is normal in size. Wall motion is normal.  LVEF is 55-60%.  2. RIGHT VENTRICLE:  The right ventricle is normal in structure and function without any thrombus or masses.    3. LEFT ATRIUM:  The left atrium is dilated in size without any thrombus or masses.  There is not spontaneous echo contrast ("smoke") in the left atrium consistent with a low flow state.  4. LEFT ATRIAL APPENDAGE:  The left atrial appendage is free of any thrombus or masses. The appendage has  single lobes. Pulse doppler indicates moderate flow in the appendage.  5. ATRIAL SEPTUM:  The atrial septum appears intact and is free of thrombus and/or masses.  There is no evidence for interatrial shunting by color doppler.  6. RIGHT ATRIUM:  The right atrium is normal in size and function without any thrombus or masses.  7. MITRAL VALVE:  The mitral valve is mildly thickened in structure and function with Mild regurgitation.  There were no obvious vegetations or stenosis.  8. AORTIC VALVE:  The aortic valve is a bioprosthesis which is well-seated. There is no paravalvular leak or sign of annular abscess. There is  trivial regurgitation.  There is a 2.5 cm x 0.5 cm filamentous, mobile vegetation noted on the aortic leaflets extending into the ascending aorta.   9. TRICUSPID VALVE:  The tricuspid valve is normal in structure and function with trivial regurgitation.  There were no vegetations or stenosis  10.  PULMONIC VALVE:  The pulmonic valve is normal in structure and function with no regurgitation.  There were no vegetations or stenosis.   11. AORTIC ARCH, ASCENDING AND DESCENDING AORTA:  There was grade 2 Myrtis Ser et. Al, 1992) atherosclerosis of the proximal descending aorta.  12. PULMONARY VEINS: Anomalous pulmonary venous return was not noted.  13. PERICARDIUM: The pericardium appeared normal and non-thickened.  There is no pericardial effusion.  IMPRESSION:   1. 2.5 cm x 0.5 cm filamentous, mobile vegetation attached to the bioprosthetic aortic valve. Trivial AI. 2. Mild miltral regurgitation. 3. No LAA thrombus 4. Normal LV function  RECOMMENDATIONS:    1.  Antibiotic therapy for  at least 6 weeks for infectious endocarditis. Likely to need a CT surgery evaluation at some point as he may need repeat AVR.  Time Spent Directly with the Patient:  45 minutes   Chrystie Nose, MD, [facility] Attending Cardiologist [facility]  09/26/2015, 3:26 PM

## 2015-09-26 NOTE — Progress Notes (Signed)
UR Completed Lien Lyman Graves-Bigelow, RN,BSN [phone]  

## 2015-09-26 NOTE — H&P (Signed)
     INTERVAL PROCEDURE H&P  History and Physical Interval Note:  09/26/2015 2:20 PM  Nicholas Walls has presented today for their planned procedure. The various methods of treatment have been discussed with the patient and family. After consideration of risks, benefits and other options for treatment, the patient has consented to the procedure.  The patients' outpatient history has been reviewed, patient examined, and no change in status from most recent office note within the past 30 days. I have reviewed the patients' chart and labs and will proceed as planned. Questions were answered to the patient's satisfaction.   Nicholas Nose, MD, [facility] Attending Cardiologist [facility]  Lisette Abu Hilty 09/26/2015, 2:20 PM

## 2015-09-27 ENCOUNTER — Inpatient Hospital Stay (HOSPITAL_COMMUNITY): Payer: Medicare Other

## 2015-09-27 ENCOUNTER — Encounter (HOSPITAL_COMMUNITY): Payer: Self-pay | Admitting: Internal Medicine

## 2015-09-27 DIAGNOSIS — B9689 Other specified bacterial agents as the cause of diseases classified elsewhere: Secondary | ICD-10-CM

## 2015-09-27 DIAGNOSIS — N2889 Other specified disorders of kidney and ureter: Secondary | ICD-10-CM

## 2015-09-27 DIAGNOSIS — I330 Acute and subacute infective endocarditis: Secondary | ICD-10-CM

## 2015-09-27 DIAGNOSIS — Y838 Other surgical procedures as the cause of abnormal reaction of the patient, or of later complication, without mention of misadventure at the time of the procedure: Secondary | ICD-10-CM

## 2015-09-27 DIAGNOSIS — I339 Acute and subacute endocarditis, unspecified: Secondary | ICD-10-CM

## 2015-09-27 DIAGNOSIS — Z954 Presence of other heart-valve replacement: Secondary | ICD-10-CM

## 2015-09-27 DIAGNOSIS — R591 Generalized enlarged lymph nodes: Secondary | ICD-10-CM

## 2015-09-27 DIAGNOSIS — T826XXA Infection and inflammatory reaction due to cardiac valve prosthesis, initial encounter: Principal | ICD-10-CM

## 2015-09-27 LAB — URINE CULTURE: Culture: NO GROWTH

## 2015-09-27 LAB — HEMOGLOBIN A1C
HEMOGLOBIN A1C: 5.3 % (ref 4.8–5.6)
MEAN PLASMA GLUCOSE: 105 mg/dL

## 2015-09-27 LAB — GLUCOSE, CAPILLARY: Glucose-Capillary: 119 mg/dL — ABNORMAL HIGH (ref 65–99)

## 2015-09-27 LAB — LEUKOCYTE ALKALINE PHOSPHATASE: Leukocyte Alkaline  Phos Stain: 135 — ABNORMAL HIGH (ref 25–130)

## 2015-09-27 MED ORDER — ACETAMINOPHEN 325 MG PO TABS
650.0000 mg | ORAL_TABLET | Freq: Four times a day (QID) | ORAL | Status: DC
  Administered 2015-09-27 – 2015-09-29 (×9): 650 mg via ORAL
  Filled 2015-09-27 (×9): qty 2

## 2015-09-27 MED ORDER — RIFAMPIN 300 MG PO CAPS
300.0000 mg | ORAL_CAPSULE | Freq: Three times a day (TID) | ORAL | Status: DC
  Administered 2015-09-27 – 2015-10-02 (×15): 300 mg via ORAL
  Filled 2015-09-27 (×18): qty 1

## 2015-09-27 MED ORDER — IBUPROFEN 200 MG PO TABS: 400.0000 mg | ORAL_TABLET | Freq: Four times a day (QID) | ORAL | Status: DC | PRN

## 2015-09-27 NOTE — Progress Notes (Addendum)
Patient Name: Nicholas Walls Date of Encounter: 09/27/2015  Active Problems:   Sleep apnea   S/P aortic valve replacement with bioprosthetic valve   Pure hypercholesterolemia   Anemia   Renal infarction [facility])   Lymph node enlargement   Renal mass   Alkaline phosphatase elevation   Systolic murmur   Other specified fever   Length of Stay: 1  SUBJECTIVE  The patient had fever and rigors at night, now feeling slightly better, no chest pain, improved abdominal pains.   CURRENT MEDS . acetaminophen  650 mg Oral Q6H  . aspirin EC  81 mg Oral Daily  . ceFEPime (MAXIPIME) IV  2 g Intravenous 3 times per day  . Chlorhexidine Gluconate Cloth  6 each Topical Q0600  . heparin  5,000 Units Subcutaneous 3 times per day  . metoprolol tartrate  12.5 mg Oral Daily  . mupirocin ointment  1 application Nasal BID  . simvastatin  20 mg Oral QHS  . sodium chloride flush  3 mL Intravenous Q12H  . vancomycin  1,000 mg Intravenous Q12H    OBJECTIVE  Filed Vitals:   09/27/15 0137 09/27/15 0214 09/27/15 0241 09/27/15 0636  BP:    108/58  Pulse:    66  Temp: 103.1 F (39.5 C) 102.8 F (39.3 C) 99.2 F (37.3 C) 98.3 F (36.8 C)  TempSrc: Oral Oral Oral Oral  Resp:    18  Height:      Weight:    195 lb 9.6 oz (88.724 kg)  SpO2:    96%    Intake/Output Summary (Last 24 hours) at 09/27/15 0940 Last data filed at 09/27/15 0546  Gross per 24 hour  Intake    220 ml  Output   1675 ml  Net  -1455 ml   Filed Weights   09/26/15 0159 09/26/15 0350 09/27/15 0636  Weight: 199 lb 14.4 oz (90.674 kg) 199 lb 4.8 oz (90.402 kg) 195 lb 9.6 oz (88.724 kg)    PHYSICAL EXAM  General: Pleasant, NAD. Neuro: Alert and oriented X 3. Moves all extremities spontaneously. Psych: Normal affect. HEENT:  Normal  Neck: Supple without bruits or JVD. Lungs:  Resp regular and unlabored, CTA. Heart: RRR no s3, s4, 3/6 holosystolic murmur. Abdomen: Soft, non-tender, non-distended, BS + x 4.    Extremities: No clubbing, cyanosis or edema. DP/PT/Radials 2+ and equal bilaterally.  Accessory Clinical Findings  CBC  Recent Labs  09/26/15 0335  WBC 8.9  NEUTROABS 7.3  HGB 10.7*  HCT 32.3*  MCV 89.2  PLT 127*   Basic Metabolic Panel  Recent Labs  09/26/15 0335  NA 137  K 4.2  CL 102  CO2 26  GLUCOSE 109*  BUN 15  CREATININE 1.14  CALCIUM 8.6*  MG 1.9   Liver Function Tests  Recent Labs  09/26/15 0335  AST 33  ALT 32  ALKPHOS 124  BILITOT 1.5*  PROT 5.6*  ALBUMIN 2.8*    Recent Labs  09/26/15 0335 09/26/15 1014 09/26/15 1554  TROPONINI 0.05* 0.03 0.18*    Recent Labs  09/26/15 0335  TSH 1.332   TELE: SR    ASSESSMENT AND PLAN  This is a 80 y.o. white male with no prior history of CAD but has history of bicuspid aortic valve related severe aortic stenosis s/p AVR w/ pericardial tissue valve (10/13/11 by Dr. Tyrone Sage), dyslipidemia, pulmonary emphysema, hiatal hernia, cholecystectomy and sleep apnea not on CPAP (mask does not fit well) who [facility] with [facility].  Active Problems:   Sleep apnea  S/P aortic valve replacement with bioprosthetic valve  Pure hypercholesterolemia  Anemia  Renal infarction [facility])  Lymph node enlargement  Renal mass  Alkaline phosphatase elevation  Systolic murmur  Other specified fever  1. 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 The patient continues to have high fever > 103 with rigors multiple times the last night. The blood cultures from [place], [place] and preliminary from here so far show no growth (for aerobic and anaerobic). We have consulted ID service who is coming to evaluate him today. We are not testing and not covering for a fungal infection, I will leave it up to ID to decide if necessary.  Dr Tyrone Sage saw the patient yesterday and  would like to continue medical therapy for now. - continue tylenol 650 mg Q6H and ibuprofen for break through fevers  2. Renal infarction  - Crea stable and normal  3. Renal mass Likely cyst per radiologist report Outpatient follow up, UA has been benign without hematuria  4. Enlarged Lymphnode  Pretracheal lymphnode 19 mm, outpatient follow up with repeat imaging   Signed, Lars Masson MD, [facility] 09/27/2015

## 2015-09-27 NOTE — Progress Notes (Signed)
Patient ID: Nicholas Walls, male   DOB: Dec 07, 1935, 80 y.o.   MRN: [id number]      [address]       [name] [address]             [phone]                 1 Day Post-Op Procedure(s) (LRB): TRANSESOPHAGEAL ECHOCARDIOGRAM (TEE) (N/A)  LOS: 1 day   Subjective: Fever to 103 during night last night, says feels better today, no further fever  Objective: Vital signs in last 24 hours: Patient Vitals for the past 24 hrs:  BP Temp Temp src Pulse Resp SpO2 Weight  09/27/15 1700 - 98.8 F (37.1 C) Oral - - - -  09/27/15 1100 - 98.6 F (37 C) Oral - - - -  09/27/15 0636 (!) 108/58 mmHg 98.3 F (36.8 C) Oral 66 18 96 % 195 lb 9.6 oz (88.724 kg)  09/27/15 0241 - 99.2 F (37.3 C) Oral - - - -  09/27/15 0214 - (!) 102.8 F (39.3 C) Oral - - - -  09/27/15 0137 - (!) 103.1 F (39.5 C) Oral - - - -  09/26/15 2149 (!) 106/50 mmHg 98.9 F (37.2 C) Oral 75 20 92 % -  09/26/15 1844 - 99.1 F (37.3 C) - - - - -    Filed Weights   09/26/15 0159 09/26/15 0350 09/27/15 0636  Weight: 199 lb 14.4 oz (90.674 kg) 199 lb 4.8 oz (90.402 kg) 195 lb 9.6 oz (88.724 kg)    Hemodynamic parameters for last 24 hours:    Intake/Output from previous day: 01/25 0701 - 01/26 0700 In: 220 [P.O.:120; IV Piggyback:100] Out: 1975 [Urine:1725; Stool:250] Intake/Output this shift: Total I/O In: 720 [P.O.:720] Out: 1475 [Urine:1475]  Scheduled Meds: . acetaminophen  650 mg Oral Q6H  . aspirin EC  81 mg Oral Daily  . ceFEPime (MAXIPIME) IV  2 g Intravenous 3 times per day  . Chlorhexidine Gluconate Cloth  6 each Topical Q0600  . heparin  5,000 Units Subcutaneous 3 times per day  . metoprolol tartrate  12.5 mg Oral Daily  . mupirocin ointment  1 application Nasal BID  . rifampin  300 mg Oral 3 times per day  . simvastatin  20 mg Oral QHS  . sodium chloride flush  3 mL Intravenous Q12H  . vancomycin  1,000 mg Intravenous Q12H   Continuous Infusions:  PRN Meds:.acetaminophen,  ibuprofen, morphine injection, sorbitol, zolpidem  General appearance: alert, cooperative and no distress Neurologic: intact Heart: systolic murmur: early systolic 2/6, crescendo at 2nd left intercostal space Lungs: clear to auscultation bilaterally Abdomen: soft, non-tender; bowel sounds normal; no masses,  no organomegaly Extremities: extremities normal, atraumatic, no cyanosis or edema and Homans sign is negative, no sign of DVT No sign of peripheral emboli  Lab Results: CBC: Recent Labs  09/26/15 0335  WBC 8.9  HGB 10.7*  HCT 32.3*  PLT 127*   BMET:  Recent Labs  09/26/15 0335  NA 137  K 4.2  CL 102  CO2 26  GLUCOSE 109*  BUN 15  CREATININE 1.14  CALCIUM 8.6*    PT/INR:  Recent Labs  09/26/15 0335  LABPROT 17.7*  INR 1.45     Radiology No results found.   Assessment/Plan: S/P Procedure(s) (LRB): TRANSESOPHAGEAL ECHOCARDIOGRAM (TEE) (N/A) Continue close monitoring, culture of blood for AFB and fungus also sent, so far culture negative TTE done today does  No address  the significance of situation noted on TEE yesterday  [name] MD 09/27/2015 6:30 PM

## 2015-09-27 NOTE — Progress Notes (Signed)
    Received call from nursing regarding patient with temp spike of 103.1 degrees and rigors. Chart indicates bioprosthetic AV vegetation by TEE (trivial AR) currently on Cefepime and Vancomycin for endocarditis and already seen by Dr. Tyrone Sage with TCTS and plan for medical management. Blood cultures here so far pending. He had temp of 101.1 afternoon yesterday. Recommended Tylenol and sending two additional sets of blood cultures with recheck temp in 30 minutes.  Jonelle Sidle, M.D., F.A.C.C.

## 2015-09-27 NOTE — Progress Notes (Signed)
[facility] of [place], [place] contacted regarding blood cx. [phone]  Drawn 01/24, negative so far. They will be resulted possibly tomorrow. Can call micro directly at [phone] to follow up.  Theodore Demark, PA-C 09/27/2015 9:08 AM Beeper [phone]

## 2015-09-27 NOTE — Plan of Care (Signed)
Problem: Pain Managment: Goal: General experience of comfort will improve Outcome: Completed/Met Date Met:  09/27/15 No complaints of pain. Patient verbalizes need to call RN if he develops pain.     

## 2015-09-27 NOTE — Progress Notes (Signed)
  Echocardiogram 2D Echocardiogram has been performed.  Shavaughn Seidl 09/27/2015, 1:30 PM

## 2015-09-27 NOTE — Progress Notes (Signed)
       [address]       [name] [address]             [phone]      Blood cultures for fungus and AFB ordered   [name] MD      [address] [place] [place] [address] Office [phone]   Beeper [phone]

## 2015-09-27 NOTE — Consult Note (Signed)
[place] for Infectious Disease       Reason for Consult: PVE    Referring Physician: Dr. Meda Coffee  Active Problems:   Sleep apnea   S/P aortic valve replacement with bioprosthetic valve   Pure hypercholesterolemia   Anemia   Renal infarction [facility])   Lymph node enlargement   Renal mass   Alkaline phosphatase elevation   Systolic murmur   Other specified fever   . acetaminophen  650 mg Oral Q6H  . aspirin EC  81 mg Oral Daily  . ceFEPime (MAXIPIME) IV  2 g Intravenous 3 times per day  . Chlorhexidine Gluconate Cloth  6 each Topical Q0600  . heparin  5,000 Units Subcutaneous 3 times per day  . metoprolol tartrate  12.5 mg Oral Daily  . mupirocin ointment  1 application Nasal BID  . simvastatin  20 mg Oral QHS  . sodium chloride flush  3 mL Intravenous Q12H  . vancomycin  1,000 mg Intravenous Q12H    Recommendations: Continue with vancomycin and cefepime Fungal blood culture sent today by Dr. Servando Snare Culture negative so far  no contraindication to rifampin so will add that  Will hold on gentamicin for now to be sure creat stable after dye  Assessment: He has prosthetic valve endocarditis with a bioprosthetic valve.  Culture with ngtd, had received amoxicillin prior to blood cultures.  Causative organisms include bacteria, fungal.    Antibiotics: Vancomycin and cefepime  HPI: Nicholas Walls is a 80 y.o. male with bioprosthetic valve replacement in 2013 by Dr. Servando Snare for aortic stenosis who noted rigors starting about 2 weeks ago.  No associated fever and recent evaluation with normal WBC.  He then presented to [facility] after noting abdominal pain, thought to be pneumonia and CTA done which noted a renal infarct.  No urinary complaints, he was diagnosed with sinusitis and given steroids and amoxicillin.  Recently stopped simvastatin with increased alk phos.  He was started on vancomycin and levaquin in the [place] ED and transferred here.     TEE here shows a 2.5 x 0.5 cm vegetation on aortic valve.  Still having some rigors.  [place] and not around any animals.  [place].    Review of Systems:  Constitutional: negative for fevers and anorexia Cardiovascular: negative for chest pain Gastrointestinal: negative for diarrhea All other systems reviewed and are negative   Past Medical History  Diagnosis Date  . Hyperlipidemia   . Aortic stenosis, severe     s/p AVR with pericardial tissue valve Feb 2013 per Dr. Servando Snare  . Osteoarthritis      End-stage osteoarthritis--left knee  . History of hiatal hernia   . History of anemia of chronic disease   . Hyponatremia     Mild hyponatremia, allowed to self-correct, asymptomatic  . Aortic valve defect   . Hypertension   . Bradycardia by electrocardiogram   . Sleep apnea      4 yrs ago     no cpap  . Full dentures     Social History  Substance Use Topics  . Smoking status: Former Smoker    Types: Cigarettes    Quit date: 09/01/1981  . Smokeless tobacco: Never Used  . Alcohol Use: 1.2 oz/week    2 Cans of beer per week     Comment: weekly    Family History  Problem Relation Age of Onset  . Throat cancer Mother   . Heart failure  Father   . Lung cancer Father   . Hypertension Sister     Allergies  Allergen Reactions  . Ultram [Tramadol Hcl]     "seeing things"    Physical Exam: Constitutional: in no apparent distress and alert  Filed Vitals:   09/27/15 0636 09/27/15 1100  BP: 108/58   Pulse: 66   Temp: 98.3 F (36.8 C) 98.6 F (37 C)  Resp: 18    EYES: anicteric ENMT: no thrush Cardiovascular: Cor RRR Respiratory: CTA B; normal respiratory effort GI: Bowel sounds are normal, liver is not enlarged, spleen is not enlarged Musculoskeletal: peripheral pulses normal, no pedal edema, no clubbing or cyanosis Skin: negatives: no rash Hematologic: no cervical lad  Lab Results  Component Value Date   WBC 8.9 09/26/2015   HGB 10.7*  09/26/2015   HCT 32.3* 09/26/2015   MCV 89.2 09/26/2015   PLT 127* 09/26/2015    Lab Results  Component Value Date   CREATININE 1.14 09/26/2015   BUN 15 09/26/2015   NA 137 09/26/2015   K 4.2 09/26/2015   CL 102 09/26/2015   CO2 26 09/26/2015    Lab Results  Component Value Date   ALT 32 09/26/2015   AST 33 09/26/2015   ALKPHOS 124 09/26/2015     Microbiology: Recent Results (from the past 240 hour(s))  MRSA PCR Screening     Status: Abnormal   Collection Time: 09/26/15  2:26 AM  Result Value Ref Range Status   MRSA by PCR POSITIVE (A) NEGATIVE Final    Comment:        The GeneXpert MRSA Assay (FDA approved for NASAL specimens only), is one component of a comprehensive MRSA colonization surveillance program. It is not intended to diagnose MRSA infection nor to guide or monitor treatment for MRSA infections. RESULT CALLED TO, READ BACK BY AND VERIFIED WITH: C HALL,RN @0524  09/26/15 MKELLY   Urine culture     Status: None   Collection Time: 09/26/15  3:42 AM  Result Value Ref Range Status   Specimen Description URINE, RANDOM  Final   Special Requests NONE  Final   Culture NO GROWTH 1 DAY  Final   Report Status 09/27/2015 FINAL  Final  Culture, blood (single)     Status: None (Preliminary result)   Collection Time: 09/26/15  6:53 PM  Result Value Ref Range Status   Specimen Description BLOOD RIGHT FOREARM  Final   Special Requests BOTTLES DRAWN AEROBIC AND ANAEROBIC 5CC EACH  Final   Culture PENDING  Incomplete   Report Status PENDING  Incomplete    Scharlene Gloss, [place] for Infectious Disease [facility] [email or web address] [phone] pager  [phone] cell 09/27/2015, 12:02 PM

## 2015-09-27 NOTE — Care Management Note (Addendum)
Case Management Note  Patient Details  Name: Nicholas Walls MRN: [id number] Date of Birth: July 14, 1936  Subjective/Objective: Pt is a transfer from [facility]. Admitted for Abdominal Pain. Hx: aortic valve replacementM  with bioprosthetic valve. TEE performed and positive for vegetation. Plan for IV Antibiotics for home. Pt is from home with wife and has support of two daughters. Per wife she is able to learn how to administer the IV Antibiotics.                 Action/Plan: CM will continue to monitor for disposition needs.    Expected Discharge Date:                  Expected Discharge Plan:  Home w Home Health Services  In-House Referral:     Discharge planning Services  CM Consult  Post Acute Care Choice:  Home Health, Durable Medical Equipment Choice offered to:     DME Arranged:   IV/ Pump/ Pole DME Agency:   [facility]  HH Arranged:   Registered Nurse [facility] Agency:   [facility]  Status of Service:  Completed.  Medicare Important Message Given:    Date Medicare IM Given:    Medicare IM give by:    Date Additional Medicare IM Given:    Additional Medicare Important Message give by:     If discussed at Long Length of Stay Meetings, dates discussed:    Additional Comments:  1203 10-02-15 Tomi Bamberger, RN,BSN [phone] CM received call from Staff RN that [facility] stated that the IV medication would not be covered via insurance. CM did state that pt could look on line via company to seek assistance. Per daughter they will pay for medication. Pt d/c and CM faxed information to company. No further needs from CM @ this time.    1034 10-02-15 Tomi Bamberger, RN, BSN [phone] CM did speak with pt and family in regards to discharge. Plan will be for [facility] to call pt around 1:00 to get estimated time of arrival for administration of Gentamycin. CM will call Agency once pt is ready for d/c. No further needs from CM at  this time.   1553 10-01-15 Tomi Bamberger, RN, BSN [phone] CM did speak with pt in regards to disposition needs. Pt and wife agreeable to [facility]. CM to fax information orders and Rx's to [facility] and then [facility] will fax Rx's to [facility]. HH Services will be able to receive patient by the 2:00 pm dose on 10-02-15. No further needs from CM at this time.    1543 09-28-15 Tomi Bamberger, RN, BSN [phone] CM did speak with wife and pt in regards to disposition needs. CM did provide agency list for [facility]. CM did not make a referral in order to give pt/ family time to think about agency.  Family has used [facility] in the past. CM did call Kriste Basque with [facility] @ [phone] fax: [phone]. Kriste Basque stated they contract with [facility] for IV antibiotic infusions @ [phone] and fax [phone]. If pt was to d/c over the weekend Rx can be faxed to [facility] and CM will need to offer choice and make referral to [facility] . No further needs at this time.   Gala Lewandowsky, RN 09/27/2015, 12:21 PM

## 2015-09-28 ENCOUNTER — Encounter (HOSPITAL_COMMUNITY): Payer: Self-pay | Admitting: General Practice

## 2015-09-28 DIAGNOSIS — I38 Endocarditis, valve unspecified: Secondary | ICD-10-CM | POA: Diagnosis present

## 2015-09-28 DIAGNOSIS — E7800 Pure hypercholesterolemia, unspecified: Secondary | ICD-10-CM

## 2015-09-28 DIAGNOSIS — T826XXA Infection and inflammatory reaction due to cardiac valve prosthesis, initial encounter: Secondary | ICD-10-CM | POA: Diagnosis present

## 2015-09-28 LAB — COMPREHENSIVE METABOLIC PANEL
ALT: 28 U/L (ref 17–63)
AST: 21 U/L (ref 15–41)
Albumin: 2.4 g/dL — ABNORMAL LOW (ref 3.5–5.0)
Alkaline Phosphatase: 145 U/L — ABNORMAL HIGH (ref 38–126)
Anion gap: 8 (ref 5–15)
BUN: 17 mg/dL (ref 6–20)
CO2: 26 mmol/L (ref 22–32)
Calcium: 8.5 mg/dL — ABNORMAL LOW (ref 8.9–10.3)
Chloride: 102 mmol/L (ref 101–111)
Creatinine, Ser: 1.1 mg/dL (ref 0.61–1.24)
GFR calc Af Amer: >60 mL/min (ref >60)
GFR calc non Af Amer: >60 mL/min (ref >60)
Glucose, Bld: 104 mg/dL — ABNORMAL HIGH (ref 65–99)
Potassium: 3.8 mmol/L (ref 3.5–5.1)
Sodium: 136 mmol/L (ref 135–145)
Total Bilirubin: 2.3 mg/dL — ABNORMAL HIGH (ref 0.3–1.2)
Total Protein: 5.9 g/dL — ABNORMAL LOW (ref 6.5–8.1)

## 2015-09-28 LAB — CBC
HCT: 32.1 % — ABNORMAL LOW (ref 39.0–52.0)
Hemoglobin: 10.9 g/dL — ABNORMAL LOW (ref 13.0–17.0)
MCH: 29.9 pg (ref 26.0–34.0)
MCHC: 34 g/dL (ref 30.0–36.0)
MCV: 87.9 fL (ref 78.0–100.0)
Platelets: 155 10*3/uL (ref 150–400)
RBC: 3.65 MIL/uL — ABNORMAL LOW (ref 4.22–5.81)
RDW: 13.1 % (ref 11.5–15.5)
WBC: 8.8 10*3/uL (ref 4.0–10.5)

## 2015-09-28 LAB — VANCOMYCIN, TROUGH: Vancomycin Tr: 12 ug/mL (ref 10.0–20.0)

## 2015-09-28 LAB — GLUCOSE, CAPILLARY
Glucose-Capillary: 57 mg/dL — ABNORMAL LOW (ref 65–99)
Glucose-Capillary: 143 mg/dL — ABNORMAL HIGH (ref 65–99)

## 2015-09-28 MED ORDER — VANCOMYCIN HCL 10 G IV SOLR
1250.0000 mg | Freq: Two times a day (BID) | INTRAVENOUS | Status: DC
  Administered 2015-09-28 – 2015-10-02 (×9): 1250 mg via INTRAVENOUS
  Filled 2015-09-28 (×11): qty 1250

## 2015-09-28 NOTE — Care Management Important Message (Signed)
Important Message  Patient Details  Name: Nicholas Walls MRN: [id number] Date of Birth: 1936/04/09   Medicare Important Message Given:  Yes    Gala Lewandowsky, RN 09/28/2015, 3:52 PM

## 2015-09-28 NOTE — Progress Notes (Signed)
Patient Name: Nicholas Walls Date of Encounter: 09/28/2015  Active Problems:   Sleep apnea   S/P aortic valve replacement with bioprosthetic valve   Pure hypercholesterolemia   Anemia   Renal infarction [facility])   Lymph node enlargement   Renal mass   Alkaline phosphatase elevation   Systolic murmur   Other specified fever   Length of Stay: 2  SUBJECTIVE  The patient feels better today, no fevers overnight but he is on around the clock tylenol. No chest pain, improved abdominal pains.   CURRENT MEDS . acetaminophen  650 mg Oral Q6H  . aspirin EC  81 mg Oral Daily  . ceFEPime (MAXIPIME) IV  2 g Intravenous 3 times per day  . Chlorhexidine Gluconate Cloth  6 each Topical Q0600  . heparin  5,000 Units Subcutaneous 3 times per day  . metoprolol tartrate  12.5 mg Oral Daily  . mupirocin ointment  1 application Nasal BID  . rifampin  300 mg Oral 3 times per day  . simvastatin  20 mg Oral QHS  . sodium chloride flush  3 mL Intravenous Q12H  . vancomycin  1,250 mg Intravenous Q12H    OBJECTIVE  Filed Vitals:   09/27/15 2030 09/27/15 2246 09/28/15 0539 09/28/15 1001  BP: 124/52  132/56 132/56  Pulse: 72  67 66  Temp: 98.9 F (37.2 C) 99.7 F (37.6 C) 99.3 F (37.4 C)   TempSrc: Oral Oral Oral   Resp: 19  18   Height:      Weight:   194 lb 14.4 oz (88.406 kg)   SpO2: 96%  96%     Intake/Output Summary (Last 24 hours) at 09/28/15 1131 Last data filed at 09/28/15 0900  Gross per 24 hour  Intake   1720 ml  Output   2450 ml  Net   -730 ml   Filed Weights   09/26/15 0350 09/27/15 0636 09/28/15 0539  Weight: 199 lb 4.8 oz (90.402 kg) 195 lb 9.6 oz (88.724 kg) 194 lb 14.4 oz (88.406 kg)    PHYSICAL EXAM  General: Pleasant, NAD. Neuro: Alert and oriented X 3. Moves all extremities spontaneously. Psych: Normal affect. HEENT:  Normal  Neck: Supple without bruits or JVD. Lungs:  Resp regular and unlabored, CTA. Heart: RRR no s3, s4, 3/6 holosystolic  murmur. Abdomen: Soft, non-tender, non-distended, BS + x 4.  Extremities: No clubbing, cyanosis or edema. DP/PT/Radials 2+ and equal bilaterally.  Accessory Clinical Findings  CBC  Recent Labs  09/26/15 0335  WBC 8.9  NEUTROABS 7.3  HGB 10.7*  HCT 32.3*  MCV 89.2  PLT 127*   Basic Metabolic Panel  Recent Labs  09/26/15 0335  NA 137  K 4.2  CL 102  CO2 26  GLUCOSE 109*  BUN 15  CREATININE 1.14  CALCIUM 8.6*  MG 1.9   Liver Function Tests  Recent Labs  09/26/15 0335  AST 33  ALT 32  ALKPHOS 124  BILITOT 1.5*  PROT 5.6*  ALBUMIN 2.8*    Recent Labs  09/26/15 0335 09/26/15 1014 09/26/15 1554  TROPONINI 0.05* 0.03 0.18*    Recent Labs  09/26/15 0335  TSH 1.332   TELE: SR    ASSESSMENT AND PLAN  This is a 80 y.o. white male with no prior history of CAD but has history of bicuspid aortic valve related severe aortic stenosis s/p AVR w/ pericardial tissue valve (10/13/11 by Dr. Tyrone Sage), dyslipidemia, pulmonary emphysema, hiatal hernia, cholecystectomy and sleep apnea  not on CPAP (mask does not fit well) who [facility].  Active Problems:   Sleep apnea  S/P aortic valve replacement with bioprosthetic valve  Pure hypercholesterolemia  Anemia  Renal infarction [facility])  Lymph node enlargement  Renal mass  Alkaline phosphatase elevation  Systolic murmur  Other specified fever  1. 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 The blood cultures from [place], [place] and preliminary from here so far show no growth (for aerobic and anaerobic). We have consulted ID service, they added Rifampin and considering adding Gentamycin if Crea stable post contrast. Dr Tyrone Sage follows and would like to continue medical therapy for now.  2. Renal infarction  - Crea stable and normal  3. Renal  mass Likely cyst per radiologist report Outpatient follow up, UA has been benign without hematuria  4. Enlarged Lymphnode  Pretracheal lymphnode 19 mm, outpatient follow up with repeat imaging   Signed, Lars Masson MD, [facility] 09/28/2015

## 2015-09-28 NOTE — Progress Notes (Signed)
Patient ID: Nicholas Walls, male   DOB: 1935-12-12, 80 y.o.   MRN: [id number]      [address]       [name] [address]             [phone]                 2 Days Post-Op Procedure(s) (LRB): TRANSESOPHAGEAL ECHOCARDIOGRAM (TEE) (N/A)  LOS: 2 days   Subjective: feels better today, no further fever or chills  Objective: Vital signs in last 24 hours: Patient Vitals for the past 24 hrs:  BP Temp Temp src Pulse Resp SpO2 Weight  09/28/15 1329 - 98.2 F (36.8 C) Oral - - - -  09/28/15 1001 (!) 132/56 mmHg - - 66 - - -  09/28/15 0539 (!) 132/56 mmHg 99.3 F (37.4 C) Oral 67 18 96 % 194 lb 14.4 oz (88.406 kg)  09/27/15 2246 - 99.7 F (37.6 C) Oral - - - -  09/27/15 2030 (!) 124/52 mmHg 98.9 F (37.2 C) Oral 72 19 96 % -    Filed Weights   09/26/15 0350 09/27/15 0636 09/28/15 0539  Weight: 199 lb 4.8 oz (90.402 kg) 195 lb 9.6 oz (88.724 kg) 194 lb 14.4 oz (88.406 kg)    Hemodynamic parameters for last 24 hours:    Intake/Output from previous day: 01/26 0701 - 01/27 0700 In: 1600 [P.O.:1200; IV Piggyback:400] Out: 2875 [Urine:2875] Intake/Output this shift: Total I/O In: 600 [P.O.:600] Out: 300 [Urine:300]  Scheduled Meds: . acetaminophen  650 mg Oral Q6H  . aspirin EC  81 mg Oral Daily  . ceFEPime (MAXIPIME) IV  2 g Intravenous 3 times per day  . Chlorhexidine Gluconate Cloth  6 each Topical Q0600  . heparin  5,000 Units Subcutaneous 3 times per day  . metoprolol tartrate  12.5 mg Oral Daily  . mupirocin ointment  1 application Nasal BID  . rifampin  300 mg Oral 3 times per day  . simvastatin  20 mg Oral QHS  . sodium chloride flush  3 mL Intravenous Q12H  . vancomycin  1,250 mg Intravenous Q12H   Continuous Infusions:  PRN Meds:.acetaminophen, ibuprofen, morphine injection, sorbitol, zolpidem  General appearance: alert, cooperative and no distress Neurologic: intact Heart: systolic murmur: early systolic 2/6, crescendo at 2nd left  intercostal space Lungs: clear to auscultation bilaterally Abdomen: soft, non-tender; bowel sounds normal; no masses,  no organomegaly Extremities: extremities normal, atraumatic, no cyanosis or edema, Homans sign is negative, no sign of DVT and no emboli  Lab Results: CBC: Recent Labs  09/26/15 0335 09/28/15 1154  WBC 8.9 8.8  HGB 10.7* 10.9*  HCT 32.3* 32.1*  PLT 127* 155   BMET:  Recent Labs  09/26/15 0335 09/28/15 1154  NA 137 136  K 4.2 3.8  CL 102 102  CO2 26 26  GLUCOSE 109* 104*  BUN 15 17  CREATININE 1.14 1.10  CALCIUM 8.6* 8.5*    PT/INR:  Recent Labs  09/26/15 0335  LABPROT 17.7*  INR 1.45     Radiology No results found.   Assessment/Plan: S/P Procedure(s) (LRB): TRANSESOPHAGEAL ECHOCARDIOGRAM (TEE) (N/A) Mobilize will order ambulation and PT  Continue current non operative treatment- await culture results     [name] MD 09/28/2015 5:55 PM

## 2015-09-28 NOTE — Progress Notes (Signed)
Pharmacy Antibiotic Follow-up Note  Nicholas Walls is a 80 y.o. year-old male admitted on 09/26/2015.  The patient is currently on day 3 of Vancomycin for endocarditis.  Assessment/Plan: Increase Vancomycin to 1250 mg iv Q 12 hours Continue to follow    Temp (24hrs), Avg:98.9 F (37.2 C), Min:98.3 F (36.8 C), Max:99.7 F (37.6 C)   Recent Labs Lab 09/26/15 0335  WBC 8.9    Recent Labs Lab 09/26/15 0335  CREATININE 1.14   Estimated Creatinine Clearance: 63.1 mL/min (by C-G formula based on Cr of 1.14).    Allergies  Allergen Reactions  . Ultram [Tramadol Hcl]     "seeing things"   1/25 MRSA PCR - positive  1/25 BCx x1 -  1/26 BCx x2 -  1/26 AFB cx -  1/26 fungus cx -   Thank you for allowing pharmacy to be a part of this patient's care.  Elwin Sleight PharmD 09/28/2015 6:11 AM

## 2015-09-28 NOTE — Progress Notes (Signed)
    [facility]   Reason for visit: Follow up on PVE  Interval History: now afebrile, no chills/rigors for first time.  Started rifampin as well.    Physical Exam: Constitutional:  Filed Vitals:   09/28/15 0539 09/28/15 1001  BP: 132/56 132/56  Pulse: 67 66  Temp: 99.3 F (37.4 C)   Resp: 18    patient appears in NAD Respiratory: Normal respiratory effort; CTA B Cardiovascular: RRR  Review of Systems: Constitutional: negative for chills and malaise Cardiovascular: negative for chest pain Gastrointestinal: negative for nausea and diarrhea  Lab Results  Component Value Date   WBC 8.9 09/26/2015   HGB 10.7* 09/26/2015   HCT 32.3* 09/26/2015   MCV 89.2 09/26/2015   PLT 127* 09/26/2015    Lab Results  Component Value Date   CREATININE 1.14 09/26/2015   BUN 15 09/26/2015   NA 137 09/26/2015   K 4.2 09/26/2015   CL 102 09/26/2015   CO2 26 09/26/2015    Lab Results  Component Value Date   ALT 32 09/26/2015   AST 33 09/26/2015   ALKPHOS 124 09/26/2015     Microbiology: Recent Results (from the past 240 hour(s))  MRSA PCR Screening     Status: Abnormal   Collection Time: 09/26/15  2:26 AM  Result Value Ref Range Status   MRSA by PCR POSITIVE (A) NEGATIVE Final    Comment:        The GeneXpert MRSA Assay (FDA approved for NASAL specimens only), is one component of a comprehensive MRSA colonization surveillance program. It is not intended to diagnose MRSA infection nor to guide or monitor treatment for MRSA infections. RESULT CALLED TO, READ BACK BY AND VERIFIED WITH: C HALL,RN @0524  09/26/15 MKELLY   Urine culture     Status: None   Collection Time: 09/26/15  3:42 AM  Result Value Ref Range Status   Specimen Description URINE, RANDOM  Final   Special Requests NONE  Final   Culture NO GROWTH 1 DAY  Final   Report Status 09/27/2015 FINAL  Final  Culture, blood (single)     Status: None (Preliminary result)   Collection Time:  09/26/15  6:53 PM  Result Value Ref Range Status   Specimen Description BLOOD RIGHT FOREARM  Final   Special Requests BOTTLES DRAWN AEROBIC AND ANAEROBIC 5CC  Final   Culture PENDING  Incomplete   Report Status PENDING  Incomplete  AFB culture, blood     Status: None (Preliminary result)   Collection Time: 09/27/15 11:52 AM  Result Value Ref Range Status   Specimen Description BLOOD LEFT ARM  Final   Special Requests 5CC  Final   Culture PENDING  Incomplete   Report Status PENDING  Incomplete    Impression/Plan:  1. PVE - no organism likely due to previous antibiotics.  Will wait for final report from [place] and likely place picc line this weekend if nothing grows.  Will be ideal to add gent as well.   2. Renal infarct - from #1.

## 2015-09-29 ENCOUNTER — Inpatient Hospital Stay (HOSPITAL_COMMUNITY): Payer: Medicare Other

## 2015-09-29 DIAGNOSIS — B957 Other staphylococcus as the cause of diseases classified elsewhere: Secondary | ICD-10-CM

## 2015-09-29 DIAGNOSIS — T826XXD Infection and inflammatory reaction due to cardiac valve prosthesis, subsequent encounter: Secondary | ICD-10-CM

## 2015-09-29 DIAGNOSIS — R17 Unspecified jaundice: Secondary | ICD-10-CM

## 2015-09-29 DIAGNOSIS — Y712 Prosthetic and other implants, materials and accessory cardiovascular devices associated with adverse incidents: Secondary | ICD-10-CM

## 2015-09-29 LAB — CBC
HCT: 32.3 % — ABNORMAL LOW (ref 39.0–52.0)
Hemoglobin: 10.9 g/dL — ABNORMAL LOW (ref 13.0–17.0)
MCH: 29.7 pg (ref 26.0–34.0)
MCHC: 33.7 g/dL (ref 30.0–36.0)
MCV: 88 fL (ref 78.0–100.0)
Platelets: 189 10*3/uL (ref 150–400)
RBC: 3.67 MIL/uL — ABNORMAL LOW (ref 4.22–5.81)
RDW: 13.3 % (ref 11.5–15.5)
WBC: 7.2 10*3/uL (ref 4.0–10.5)

## 2015-09-29 LAB — COMPREHENSIVE METABOLIC PANEL
ALT: 25 U/L (ref 17–63)
AST: 17 U/L (ref 15–41)
Albumin: 2.4 g/dL — ABNORMAL LOW (ref 3.5–5.0)
Alkaline Phosphatase: 148 U/L — ABNORMAL HIGH (ref 38–126)
Anion gap: 8 (ref 5–15)
BUN: 17 mg/dL (ref 6–20)
CO2: 28 mmol/L (ref 22–32)
Calcium: 8.6 mg/dL — ABNORMAL LOW (ref 8.9–10.3)
Chloride: 101 mmol/L (ref 101–111)
Creatinine, Ser: 1.16 mg/dL (ref 0.61–1.24)
GFR calc Af Amer: >60 mL/min (ref >60)
GFR calc non Af Amer: 59 mL/min — ABNORMAL LOW (ref >60)
Glucose, Bld: 90 mg/dL (ref 65–99)
Potassium: 4.4 mmol/L (ref 3.5–5.1)
Sodium: 137 mmol/L (ref 135–145)
Total Bilirubin: 2.4 mg/dL — ABNORMAL HIGH (ref 0.3–1.2)
Total Protein: 6.1 g/dL — ABNORMAL LOW (ref 6.5–8.1)

## 2015-09-29 LAB — BILIRUBIN, FRACTIONATED(TOT/DIR/INDIR)
BILIRUBIN DIRECT: 1.3 mg/dL — AB (ref 0.1–0.5)
BILIRUBIN INDIRECT: 1.2 mg/dL — AB (ref 0.3–0.9)
Total Bilirubin: 2.5 mg/dL — ABNORMAL HIGH (ref 0.3–1.2)

## 2015-09-29 LAB — LACTATE DEHYDROGENASE: LDH: 334 U/L — ABNORMAL HIGH (ref 98–192)

## 2015-09-29 LAB — GLUCOSE, CAPILLARY: GLUCOSE-CAPILLARY: 99 mg/dL (ref 65–99)

## 2015-09-29 MED ORDER — GENTAMICIN IN SALINE 1.6-0.9 MG/ML-% IV SOLN
80.0000 mg | Freq: Three times a day (TID) | INTRAVENOUS | Status: DC
  Administered 2015-09-29 – 2015-10-02 (×9): 80 mg via INTRAVENOUS
  Filled 2015-09-29 (×11): qty 50

## 2015-09-29 MED ORDER — ACETAMINOPHEN 325 MG PO TABS
650.0000 mg | ORAL_TABLET | Freq: Four times a day (QID) | ORAL | Status: DC | PRN
  Administered 2015-09-29: 650 mg via ORAL
  Filled 2015-09-29 (×2): qty 2

## 2015-09-29 NOTE — Progress Notes (Addendum)
Patient Name: Nicholas Walls Date of Encounter: 09/29/2015  Active Problems:   Sleep apnea   S/P aortic valve replacement with bioprosthetic valve   Pure hypercholesterolemia   Anemia   Renal infarction [facility])   Lymph node enlargement   Renal mass   Alkaline phosphatase elevation   Systolic murmur   Other specified fever   Prosthetic valve endocarditis (HCC)   Length of Stay: 3  SUBJECTIVE  Afebrile overnight. Repeat TTE shows no evidence for vegetation that was visualized on TEE. Vitals stable today. Cultures still NGTD.  CURRENT MEDS . acetaminophen  650 mg Oral Q6H  . aspirin EC  81 mg Oral Daily  . ceFEPime (MAXIPIME) IV  2 g Intravenous 3 times per day  . Chlorhexidine Gluconate Cloth  6 each Topical Q0600  . heparin  5,000 Units Subcutaneous 3 times per day  . metoprolol tartrate  12.5 mg Oral Daily  . mupirocin ointment  1 application Nasal BID  . rifampin  300 mg Oral 3 times per day  . simvastatin  20 mg Oral QHS  . sodium chloride flush  3 mL Intravenous Q12H  . vancomycin  1,250 mg Intravenous Q12H    OBJECTIVE  Filed Vitals:   09/29/15 0801 09/29/15 0822 09/29/15 1006 09/29/15 1021  BP: 123/66 133/68 126/60 126/60  Pulse: 79 80 74 74  Temp: 98.8 F (37.1 C) 98.7 F (37.1 C)    TempSrc: Oral Oral    Resp: 18  18   Height:      Weight:      SpO2: 96% 96% 96%     Intake/Output Summary (Last 24 hours) at 09/29/15 1107 Last data filed at 09/29/15 1026  Gross per 24 hour  Intake    540 ml  Output   2175 ml  Net  -1635 ml   Filed Weights   09/27/15 0636 09/28/15 0539 09/29/15 0602  Weight: 195 lb 9.6 oz (88.724 kg) 194 lb 14.4 oz (88.406 kg) 194 lb 11.2 oz (88.315 kg)    PHYSICAL EXAM  General: Pleasant, NAD. Neuro: Alert and oriented X 3. Moves all extremities spontaneously. Psych: Normal affect. HEENT:  Normal  Neck: Supple without bruits or JVD. Lungs:  Resp regular and unlabored, CTA. Heart: RRR no s3, s4, 3/6 holosystolic  murmur. Abdomen: Soft, non-tender, non-distended, BS + x 4.  Extremities: No clubbing, cyanosis or edema. DP/PT/Radials 2+ and equal bilaterally.  Accessory Clinical Findings  CBC  Recent Labs  09/28/15 1154 09/29/15 0345  WBC 8.8 7.2  HGB 10.9* 10.9*  HCT 32.1* 32.3*  MCV 87.9 88.0  PLT 155 189   Basic Metabolic Panel  Recent Labs  09/28/15 1154 09/29/15 0345  NA 136 137  K 3.8 4.4  CL 102 101  CO2 26 28  GLUCOSE 104* 90  BUN 17 17  CREATININE 1.10 1.16  CALCIUM 8.5* 8.6*   Liver Function Tests  Recent Labs  09/28/15 1154 09/29/15 0345  AST 21 17  ALT 28 25  ALKPHOS 145* 148*  BILITOT 2.3* 2.4*  PROT 5.9* 6.1*  ALBUMIN 2.4* 2.4*    Recent Labs  09/26/15 1554  TROPONINI 0.18*   No results for input(s): TSH, T4TOTAL, T3FREE, THYROIDAB in the last 72 hours.  Invalid input(s): FREET3 TELE: SR    ASSESSMENT AND PLAN  This is a 80 y.o. white male with no prior history of CAD but has history of bicuspid aortic valve related severe aortic stenosis s/p AVR w/ pericardial tissue  valve (10/13/11 by Dr. Tyrone Sage), dyslipidemia, pulmonary emphysema, hiatal hernia, cholecystectomy and sleep apnea not on CPAP (mask does not fit well) who [facility] with [facility].  Active Problems:   Sleep apnea  S/P aortic valve replacement with bioprosthetic valve  Pure hypercholesterolemia  Anemia  Renal infarction [facility])  Lymph node enlargement  Renal mass  Alkaline phosphatase elevation  Systolic murmur  Other specified fever  1. 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 The blood cultures from [place], [place] and preliminary from here so far show no growth (for aerobic and anaerobic). We have consulted ID service, they added Rifampin and considering adding Gentamycin if Crea stable post contrast. Dr Tyrone Sage  follows and would like to continue medical therapy for now. The prosthetic valve vegetation appears to have resolved. Would place PICC line today for long-term antibiotics.  2. Renal infarction  - Crea stable and normal  3. Renal mass Likely cyst per radiologist report Outpatient follow up, UA has been benign without hematuria  4. Enlarged Lymphnode  Pretracheal lymphnode 19 mm, outpatient follow up with repeat imaging  5. Elevated bilirubin  This has risen over the past several days. Add fractionated bilirubin to labs today and check LDH/Haptoglobin. Check RUQ ultrasound. Family reports yellowing of the skin, however, no scleral icterus or tongue yellowing. ?related to rifampin - will defer to ID recommendations. LFT's are normal. He is s/p cholecystectomy, however, ?hepatic steatosis or biliary pathology.  Chrystie Nose, MD, [facility] Attending Cardiologist [facility]   Lisette Abu [facility] 09/29/2015

## 2015-09-29 NOTE — Progress Notes (Addendum)
Per iv team RN patient had temp of 99.6 and felt a chill.  She stated she would reassess patient tomorrow for picc line placement.

## 2015-09-29 NOTE — Progress Notes (Signed)
PICC consent signed.  Pt c/o "not feeling well", "chills".  Temperature 99.6 orally.  Family at bedside.  Pt and family agree to have PICC placed but to wait until tomorrow, to monitor temperature and overall condition overnight.  Will check in AM on pt status.

## 2015-09-29 NOTE — Progress Notes (Signed)
Pharmacy Antibiotic Follow-up Note  Nicholas Walls is a 80 y.o. year-old male admitted on 09/26/2015.  The patient is currently on day 4 of antibiotics for prosthetic valve endocarditis. Will d/c cefepime and add gentamicin for synergy. Scr 1.16, stable with est. crcl ~ 60 ml/min.  Therapeutic goal: Peak = 3-4 mcg/ml Trough < 1 mcg/ml  Assessment/Plan: Gentamicin 80 mg (1mg /kg) IV Q 8 hrs Monitor renal function Gent Peak at trough at steady state.  Temp (24hrs), Avg:98.6 F (37 C), Min:98.2 F (36.8 C), Max:98.8 F (37.1 C)   Recent Labs Lab 09/26/15 0335 09/28/15 1154 09/29/15 0345  WBC 8.9 8.8 7.2    Recent Labs Lab 09/26/15 0335 09/28/15 1154 09/29/15 0345  CREATININE 1.14 1.10 1.16   Estimated Creatinine Clearance: 62 mL/min (by C-G formula based on Cr of 1.16).    Allergies  Allergen Reactions  . Ultram [Tramadol Hcl]     "seeing things"    Antimicrobials this admission: Vanc 1/25 >> Cefepime 1/25 >> Rifampin 1/26 >>  Levels/dose changes this admission: 1/27 VT = 12 - > increase to 1250 Q 12  Microbiology results: 1/24 Cx from [place] - NGTD per report 1/25 MRSA PCR - positive 1/25 Urine neg 1/25 BCx x1 -  1/26 BCx x2 - 1/26 AFB cx - 1/26 fungus cx -   Thank you for allowing pharmacy to be a part of this patient's care.  Bayard Hugger, PharmD, BCPS  Clinical Pharmacist  Pager: [phone]  09/29/2015 12:03 PM

## 2015-09-29 NOTE — Progress Notes (Signed)
[facility]   Reason for visit: Follow up on PVE  Interval History: continues to be afebrile, feels well. Some bilirubin elevation. 2 cultures from [facility] growing MRSE.     Physical Exam: Constitutional:  Filed Vitals:   09/29/15 1006 09/29/15 1021  BP: 126/60 126/60  Pulse: 74 74  Temp:    Resp: 18    patient appears in NAD Eyes: anicteric Respiratory: Normal respiratory effort; CTA B Cardiovascular: RRR  Review of Systems: Constitutional: negative for chills and malaise Cardiovascular: negative for chest pain Gastrointestinal: negative for nausea and diarrhea  Lab Results  Component Value Date   WBC 7.2 09/29/2015   HGB 10.9* 09/29/2015   HCT 32.3* 09/29/2015   MCV 88.0 09/29/2015   PLT 189 09/29/2015    Lab Results  Component Value Date   CREATININE 1.16 09/29/2015   BUN 17 09/29/2015   NA 137 09/29/2015   K 4.4 09/29/2015   CL 101 09/29/2015   CO2 28 09/29/2015    Lab Results  Component Value Date   ALT 25 09/29/2015   AST 17 09/29/2015   ALKPHOS 148* 09/29/2015     Microbiology: Recent Results (from the past 240 hour(s))  MRSA PCR Screening     Status: Abnormal   Collection Time: 09/26/15  2:26 AM  Result Value Ref Range Status   MRSA by PCR POSITIVE (A) NEGATIVE Final    Comment:        The GeneXpert MRSA Assay (FDA approved for NASAL specimens only), is one component of a comprehensive MRSA colonization surveillance program. It is not intended to diagnose MRSA infection nor to guide or monitor treatment for MRSA infections. RESULT CALLED TO, READ BACK BY AND VERIFIED WITH: C HALL,RN  09/26/15 MKELLY   Urine culture     Status: None   Collection Time: 09/26/15  3:42 AM  Result Value Ref Range Status   Specimen Description URINE, RANDOM  Final   Special Requests NONE  Final   Culture NO GROWTH 1 DAY  Final   Report Status 09/27/2015 FINAL  Final  Culture, blood (single)     Status: None  (Preliminary result)   Collection Time: 09/26/15  6:53 PM  Result Value Ref Range Status   Specimen Description BLOOD RIGHT FOREARM  Final   Special Requests BOTTLES DRAWN AEROBIC AND ANAEROBIC 5CC  Final   Culture NO GROWTH 3 DAYS  Final   Report Status PENDING  Incomplete  Culture, blood (Routine X 2) w Reflex to ID Panel     Status: None (Preliminary result)   Collection Time: 09/27/15  4:30 AM  Result Value Ref Range Status   Specimen Description BLOOD RIGHT ARM  Final   Special Requests BOTTLES DRAWN AEROBIC AND ANAEROBIC  Final   Culture NO GROWTH 2 DAYS  Final   Report Status PENDING  Incomplete  Fungus culture, blood     Status: None (Preliminary result)   Collection Time: 09/27/15  4:30 AM  Result Value Ref Range Status   Specimen Description BLOOD RIGHT ARM  Final   Special Requests   Final    BOTTLES DRAWN AEROBIC AND ANAEROBIC 5CC ADDED 09/27/15   Culture NO GROWTH 2 DAYS  Final   Report Status PENDING  Incomplete  Culture, blood (Routine X 2) w Reflex to ID Panel     Status: None (Preliminary result)   Collection Time: 09/27/15  4:40 AM  Result Value Ref Range Status   Specimen  Description BLOOD LEFT HAND  Final   Special Requests AEROBIC BOTTLE ONLY  Final   Culture NO GROWTH 2 DAYS  Final   Report Status PENDING  Incomplete  AFB culture, blood     Status: None (Preliminary result)   Collection Time: 09/27/15 11:52 AM  Result Value Ref Range Status   Specimen Description BLOOD LEFT ARM  Final   Special Requests 5CC  Final   Culture PENDING  Incomplete   Report Status PENDING  Incomplete    Impression/Plan:  1. PVE - no organism likely due to previous antibiotics.  Picc ok today.  MRSE and will need vancomycin and rifampin for 6 weeks and gentamcin q 8 hours for 2 weeks.   Will stop cefepime 2. Hyperbilirubinemia -  Small elevation.  Rare reports of cefepime causing and stopping now regardless.

## 2015-09-29 NOTE — Evaluation (Signed)
Physical Therapy Evaluation Patient Details Name: Nicholas Walls MRN: [id number] DOB: 05/24/1936 Today's Date: 09/29/2015   History of Present Illness  Pt is a 80 y/o M s/p aortic valve replacement, prosthetic aortic valve endocarditis, peripheral embolization into kidney w/ resultant renal infarct.  Pt's PMH includes Bil TKA, cervical disc surgery.  Clinical Impression  Pt admitted with above diagnosis. Pt currently with functional limitations due to the deficits listed below (see PT Problem List). Mr. Foyt requires min guard assist while ambulating, but requires min assist at times w/ high level balance activities and static balance activities.  He will benefit from HHPT at d/c to address these balance impairments and discussed w/ pt and family the benefit of using a cane at all times to increase stability. Pt will benefit from skilled PT to increase their independence and safety with mobility to allow discharge to the venue listed below.      Follow Up Recommendations Home health PT;Supervision for mobility/OOB    Equipment Recommendations  Cane    Recommendations for Other Services       Precautions / Restrictions Precautions Precautions: Fall Restrictions Weight Bearing Restrictions: No      Mobility  Bed Mobility               General bed mobility comments: Pt sitting in recliner chair upon PT arrival  Transfers Overall transfer level: Needs assistance Equipment used: None Transfers: Sit to/from Stand Sit to Stand: Min guard         General transfer comment: Min guard due to min instability noted upon standing. Denies dizziness.   Ambulation/Gait Ambulation/Gait assistance: Min assist Ambulation Distance (Feet): 150 Feet Assistive device: None;1 person hand held assist Gait Pattern/deviations: Step-through pattern;Narrow base of support;Antalgic;Staggering left;Staggering right;Decreased stride length   Gait velocity interpretation: at or above normal  speed for age/gender General Gait Details: Min staggering w/ high level balance activities, requiring min assist at times to steady.  Improved w/ 1 person HHA.  Stairs            Wheelchair Mobility    Modified Rankin (Stroke Patients Only)       Balance Overall balance assessment: Needs assistance (denies h/o fall in past 6 months) Sitting-balance support: Feet supported;No upper extremity supported Sitting balance-Leahy Scale: Good     Standing balance support: During functional activity;Single extremity supported Standing balance-Leahy Scale: Fair   Single Leg Stance - Right Leg: 0 (Requires 1 UE support to maintain stability) Single Leg Stance - Left Leg: 0 (Requires 1 UE support to maintain stability)     Rhomberg - Eyes Opened: 30 (close min guard assist) Rhomberg - Eyes Closed: 20 (min assist to steady)     Standardized Balance Assessment Standardized Balance Assessment : Dynamic Gait Index   Dynamic Gait Index Level Surface: Mild Impairment Change in Gait Speed: Mild Impairment Gait with Horizontal Head Turns: Moderate Impairment Gait with Vertical Head Turns: Moderate Impairment Gait and Pivot Turn: Mild Impairment Step Over Obstacle: Mild Impairment Step Around Obstacles: Moderate Impairment       Pertinent Vitals/Pain Pain Assessment: No/denies pain    Home Living Family/patient expects to be discharged to:: Private residence Living Arrangements: Spouse/significant other Available Help at Discharge: Family;Available 24 hours/day Type of Home: House Home Access: Level entry     Home Layout: One level Home Equipment: Walker - 2 wheels;Bedside commode      Prior Function Level of Independence: Independent  Hand Dominance   Dominant Hand: Right    Extremity/Trunk Assessment   Upper Extremity Assessment: Overall WFL for tasks assessed           Lower Extremity Assessment: Overall WFL for tasks assessed          Communication   Communication: No difficulties  Cognition Arousal/Alertness: Awake/alert Behavior During Therapy: WFL for tasks assessed/performed Overall Cognitive Status: Within Functional Limits for tasks assessed                      General Comments General comments (skin integrity, edema, etc.): VSS    Exercises        Assessment/Plan    PT Assessment Patient needs continued PT services  PT Diagnosis Difficulty walking   PT Problem List Decreased balance;Decreased knowledge of use of DME;Decreased safety awareness  PT Treatment Interventions DME instruction;Gait training;Functional mobility training;Therapeutic activities;Stair training;Therapeutic exercise;Balance training;Neuromuscular re-education;Patient/family education   PT Goals (Current goals can be found in the Care Plan section) Acute Rehab PT Goals Patient Stated Goal: to go home  PT Goal Formulation: With patient/family Time For Goal Achievement: 10/06/15 Potential to Achieve Goals: Good    Frequency Min 3X/week   Barriers to discharge        Co-evaluation               End of Session Equipment Utilized During Treatment: Gait belt Activity Tolerance: Patient tolerated treatment well Patient left: in chair;with call bell/phone within reach;with family/visitor present Nurse Communication: Mobility status         Time: 1610-9604 PT Time Calculation (min) (ACUTE ONLY): 24 min   Charges:   PT Evaluation $PT Eval Low Complexity: 1 Procedure PT Treatments $Gait Training: 8-22 mins   PT G CodesMichail Jewels PT, DPT [phone] Pager: [phone] 09/29/2015, 1:30 PM

## 2015-09-30 DIAGNOSIS — Z95828 Presence of other vascular implants and grafts: Secondary | ICD-10-CM

## 2015-09-30 LAB — COMPREHENSIVE METABOLIC PANEL
ALT: 24 U/L (ref 17–63)
AST: 19 U/L (ref 15–41)
Albumin: 2.3 g/dL — ABNORMAL LOW (ref 3.5–5.0)
Alkaline Phosphatase: 154 U/L — ABNORMAL HIGH (ref 38–126)
Anion gap: 8 (ref 5–15)
BUN: 16 mg/dL (ref 6–20)
CO2: 28 mmol/L (ref 22–32)
Calcium: 8.5 mg/dL — ABNORMAL LOW (ref 8.9–10.3)
Chloride: 100 mmol/L — ABNORMAL LOW (ref 101–111)
Creatinine, Ser: 1.04 mg/dL (ref 0.61–1.24)
GFR calc Af Amer: >60 mL/min (ref >60)
GFR calc non Af Amer: >60 mL/min (ref >60)
Glucose, Bld: 96 mg/dL (ref 65–99)
Potassium: 3.7 mmol/L (ref 3.5–5.1)
Sodium: 136 mmol/L (ref 135–145)
Total Bilirubin: 2.1 mg/dL — ABNORMAL HIGH (ref 0.3–1.2)
Total Protein: 5.9 g/dL — ABNORMAL LOW (ref 6.5–8.1)

## 2015-09-30 LAB — CBC
HCT: 31.8 % — ABNORMAL LOW (ref 39.0–52.0)
Hemoglobin: 10.5 g/dL — ABNORMAL LOW (ref 13.0–17.0)
MCH: 29.1 pg (ref 26.0–34.0)
MCHC: 33 g/dL (ref 30.0–36.0)
MCV: 88.1 fL (ref 78.0–100.0)
Platelets: 218 10*3/uL (ref 150–400)
RBC: 3.61 MIL/uL — ABNORMAL LOW (ref 4.22–5.81)
RDW: 13.4 % (ref 11.5–15.5)
WBC: 7 10*3/uL (ref 4.0–10.5)

## 2015-09-30 LAB — GLUCOSE, CAPILLARY: Glucose-Capillary: 87 mg/dL (ref 65–99)

## 2015-09-30 LAB — VANCOMYCIN, TROUGH: Vancomycin Tr: 20 ug/mL (ref 10.0–20.0)

## 2015-09-30 LAB — HAPTOGLOBIN: Haptoglobin: 298 mg/dL — ABNORMAL HIGH (ref 34–200)

## 2015-09-30 MED ORDER — SODIUM CHLORIDE 0.9% FLUSH
10.0000 mL | Freq: Two times a day (BID) | INTRAVENOUS | Status: DC
  Administered 2015-09-30 – 2015-10-01 (×3): 10 mL
  Administered 2015-10-01: 30 mL

## 2015-09-30 MED ORDER — SODIUM CHLORIDE 0.9% FLUSH: 10.0000 mL | INTRAVENOUS | Status: DC | PRN

## 2015-09-30 NOTE — Progress Notes (Signed)
[facility]   Reason for visit: Follow up on PVE  Interval History: continues to be afebrile, feels well. Some bilirubin elevation. 2 cultures from [facility] growing MRSE.     Physical Exam: Constitutional:  Filed Vitals:   09/30/15 0900 09/30/15 1336  BP:  110/54  Pulse:  62  Temp: 99 F (37.2 C) 98.6 F (37 C)  Resp:  18   patient appears in NAD Eyes: anicteric Respiratory: Normal respiratory effort; CTA B Cardiovascular: RRR  Review of Systems: Constitutional: negative for chills and malaise Cardiovascular: negative for chest pain Gastrointestinal: negative for nausea and diarrhea  Lab Results  Component Value Date   WBC 7.0 09/30/2015   HGB 10.5* 09/30/2015   HCT 31.8* 09/30/2015   MCV 88.1 09/30/2015   PLT 218 09/30/2015    Lab Results  Component Value Date   CREATININE 1.04 09/30/2015   BUN 16 09/30/2015   NA 136 09/30/2015   K 3.7 09/30/2015   CL 100* 09/30/2015   CO2 28 09/30/2015    Lab Results  Component Value Date   ALT 24 09/30/2015   AST 19 09/30/2015   ALKPHOS 154* 09/30/2015     Microbiology: Recent Results (from the past 240 hour(s))  MRSA PCR Screening     Status: Abnormal   Collection Time: 09/26/15  2:26 AM  Result Value Ref Range Status   MRSA by PCR POSITIVE (A) NEGATIVE Final    Comment:        The GeneXpert MRSA Assay (FDA approved for NASAL specimens only), is one component of a comprehensive MRSA colonization surveillance program. It is not intended to diagnose MRSA infection nor to guide or monitor treatment for MRSA infections. RESULT CALLED TO, READ BACK BY AND VERIFIED WITH: C HALL,RN  09/26/15 MKELLY   Urine culture     Status: None   Collection Time: 09/26/15  3:42 AM  Result Value Ref Range Status   Specimen Description URINE, RANDOM  Final   Special Requests NONE  Final   Culture NO GROWTH 1 DAY  Final   Report Status 09/27/2015 FINAL  Final  Culture, blood (single)      Status: None (Preliminary result)   Collection Time: 09/26/15  6:53 PM  Result Value Ref Range Status   Specimen Description BLOOD RIGHT FOREARM  Final   Special Requests BOTTLES DRAWN AEROBIC AND ANAEROBIC 5CC  Final   Culture NO GROWTH 3 DAYS  Final   Report Status PENDING  Incomplete  Culture, blood (Routine X 2) w Reflex to ID Panel     Status: None (Preliminary result)   Collection Time: 09/27/15  4:30 AM  Result Value Ref Range Status   Specimen Description BLOOD RIGHT ARM  Final   Special Requests BOTTLES DRAWN AEROBIC AND ANAEROBIC  Final   Culture NO GROWTH 2 DAYS  Final   Report Status PENDING  Incomplete  Fungus culture, blood     Status: None (Preliminary result)   Collection Time: 09/27/15  4:30 AM  Result Value Ref Range Status   Specimen Description BLOOD RIGHT ARM  Final   Special Requests   Final    BOTTLES DRAWN AEROBIC AND ANAEROBIC 5CC ADDED 09/27/15   Culture NO GROWTH 2 DAYS  Final   Report Status PENDING  Incomplete  Culture, blood (Routine X 2) w Reflex to ID Panel     Status: None (Preliminary result)   Collection Time: 09/27/15  4:40 AM  Result Value  Ref Range Status   Specimen Description BLOOD LEFT HAND  Final   Special Requests AEROBIC BOTTLE ONLY  Final   Culture NO GROWTH 2 DAYS  Final   Report Status PENDING  Incomplete  AFB culture, blood     Status: None (Preliminary result)   Collection Time: 09/27/15 11:52 AM  Result Value Ref Range Status   Specimen Description BLOOD LEFT ARM  Final   Special Requests 5CC  Final   Culture PENDING  Incomplete   Report Status PENDING  Incomplete    Impression/Plan:  1. PVE - no organism likely due to previous antibiotics.  Picc placed this am.  MRSE and will need vancomycin and rifampin for 6 weeks and gentamcin q 8 hours for 2 weeks.    2. Hyperbilirubinemia -  Decreasing   3. Fever - likely related to #1, clinically improving and not unexpected.  I do not feel this would be an indication of an  abscess developing.

## 2015-09-30 NOTE — Progress Notes (Signed)
Peripherally Inserted Central Catheter/Midline Placement  The IV Nurse has discussed with the patient and/or persons authorized to consent for the patient, the purpose of this procedure and the potential benefits and risks involved with this procedure.  The benefits include less needle sticks, lab draws from the catheter and patient may be discharged home with the catheter.  Risks include, but not limited to, infection, bleeding, blood clot (thrombus formation), and puncture of an artery; nerve damage and irregular heat beat.  Alternatives to this procedure were also discussed.  PICC/Midline Placement Documentation  PICC Single Lumen 09/30/15 PICC Right Brachial 40 cm 0 cm (Active)  Indication for Insertion or Continuance of Line Home intravenous therapies (PICC only) 09/30/2015 11:47 AM  Exposed Catheter (cm) 0 cm 09/30/2015 11:47 AM  Site Assessment Clean;Dry;Intact 09/30/2015 11:47 AM  Line Status Flushed;Saline locked;Blood return noted 09/30/2015 11:47 AM  Dressing Type Transparent 09/30/2015 11:47 AM  Dressing Change Due 10/07/15 09/30/2015 11:47 AM       Ethelda Chick 09/30/2015, 11:48 AM

## 2015-09-30 NOTE — Progress Notes (Addendum)
Patient Name: Nicholas Walls Date of Encounter: 09/30/2015  Active Problems:   Sleep apnea   S/P aortic valve replacement with bioprosthetic valve   Pure hypercholesterolemia   Anemia   Renal infarction [facility])   Lymph node enlargement   Renal mass   Alkaline phosphatase elevation   Systolic murmur   Other specified fever   Prosthetic valve endocarditis (HCC)   Length of Stay: 4  SUBJECTIVE  Feels well today. Did not get PICC line secondary to low grade temp yesterday. Bilirubin decreased some today - mixed direct and indirect bilirubin. LDH elevated at 334. RUQ ultrasound yesterday was unrevealing. Cephalosporin stopped yesterday by Dr. Luciana Axe. Low grade temp elevation overnight, required tylenol.  CURRENT MEDS . aspirin EC  81 mg Oral Daily  . Chlorhexidine Gluconate Cloth  6 each Topical Q0600  . gentamicin  80 mg Intravenous Q8H  . heparin  5,000 Units Subcutaneous 3 times per day  . metoprolol tartrate  12.5 mg Oral Daily  . mupirocin ointment  1 application Nasal BID  . rifampin  300 mg Oral 3 times per day  . simvastatin  20 mg Oral QHS  . sodium chloride flush  3 mL Intravenous Q12H  . vancomycin  1,250 mg Intravenous Q12H    OBJECTIVE  Filed Vitals:   09/29/15 2030 09/30/15 0500 09/30/15 0859 09/30/15 0900  BP: 111/47 116/57 115/51   Pulse: 72 63 71   Temp: 100.8 F (38.2 C) 99.1 F (37.3 C)  99 F (37.2 C)  TempSrc:    Oral  Resp: 16 19    Height:      Weight:  192 lb 9.6 oz (87.363 kg)    SpO2: 96% 98%      Intake/Output Summary (Last 24 hours) at 09/30/15 1053 Last data filed at 09/30/15 0949  Gross per 24 hour  Intake    780 ml  Output   2500 ml  Net  -1720 ml   Filed Weights   09/28/15 0539 09/29/15 0602 09/30/15 0500  Weight: 194 lb 14.4 oz (88.406 kg) 194 lb 11.2 oz (88.315 kg) 192 lb 9.6 oz (87.363 kg)    PHYSICAL EXAM  General: Pleasant, NAD. Neuro: Alert and oriented X 3. Moves all extremities spontaneously. Psych: Normal  affect. HEENT:  Normal  Neck: Supple without bruits or JVD. Lungs:  Resp regular and unlabored, CTA. Heart: RRR no s3, s4, 3/6 holosystolic murmur. Abdomen: Soft, non-tender, non-distended, BS + x 4.  Extremities: No clubbing, cyanosis or edema. DP/PT/Radials 2+ and equal bilaterally.  Accessory Clinical Findings  CBC  Recent Labs  09/29/15 0345 09/30/15 0545  WBC 7.2 7.0  HGB 10.9* 10.5*  HCT 32.3* 31.8*  MCV 88.0 88.1  PLT 189 218   Basic Metabolic Panel  Recent Labs  09/29/15 0345 09/30/15 0545  NA 137 136  K 4.4 3.7  CL 101 100*  CO2 28 28  GLUCOSE 90 96  BUN 17 16  CREATININE 1.16 1.04  CALCIUM 8.6* 8.5*   Liver Function Tests  Recent Labs  09/29/15 0345 09/29/15 1210 09/30/15 0545  AST 17  --  19  ALT 25  --  24  ALKPHOS 148*  --  154*  BILITOT 2.4* 2.5* 2.1*  PROT 6.1*  --  5.9*  ALBUMIN 2.4*  --  2.3*   No results for input(s): CKTOTAL, CKMB, CKMBINDEX, TROPONINI in the last 72 hours. No results for input(s): TSH, T4TOTAL, T3FREE, THYROIDAB in the last 72 hours.  Invalid input(s): FREET3 TELE: SR    ASSESSMENT AND PLAN  This is a 80 y.o. white male with no prior history of CAD but has history of bicuspid aortic valve related severe aortic stenosis s/p AVR w/ pericardial tissue valve (10/13/11 by Dr. Tyrone Sage), dyslipidemia, pulmonary emphysema, hiatal hernia, cholecystectomy and sleep apnea not on CPAP (mask does not fit well) who [facility] with [facility].  Active Problems:   Sleep apnea  S/P aortic valve replacement with bioprosthetic valve  Pure hypercholesterolemia  Anemia  Renal infarction [facility])  Lymph node enlargement  Renal mass  Alkaline phosphatase elevation  Systolic murmur  Other specified fever  1. 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 The blood cultures from  [place], [place] and preliminary from here so far show no growth (for aerobic and anaerobic). We have consulted ID service, they added Rifampin and considering adding Gentamycin if Crea stable post contrast. Dr Tyrone Sage follows and would like to continue medical therapy for now. The prosthetic valve vegetation appears to have resolved. Would place PICC line today for long-term antibiotics. Not placed yesterday apparently due to temp - however, that did not constitute fever and it is not a contraindication to place a line to my knowledge - he is on broad spectrum antibiotics anyway. Please place PICC today.  2. Renal infarction  - Creatinine stable, in fact improved today.  3. Renal mass Likely cyst per radiologist report Outpatient follow up, UA has been benign without hematuria  4. Enlarged Lymphnode  Pretracheal lymphnode 19 mm, outpatient follow up with repeat imaging  5. Elevated bilirubin  Bilirubin is improved today. Fractionation shows a mixed picture. RUQ ultrasound was unrevealing. Taken off of cephalosporin - a possible rare cause. Hopeful it will trend down. Liver enzymes are normal. If it worsens, would consult GI.  6. Recurrent fever ? Central fevers - could he have abscess? Seems to be clinically improving. Appreciate Dr. Ephriam Knuckles advice on this.  Chrystie Nose, MD, [facility] Attending Cardiologist [facility]   Lisette Abu [facility] 09/30/2015

## 2015-09-30 NOTE — Progress Notes (Signed)
Pharmacy Antibiotic Follow-up Note  Nicholas Walls is a 80 y.o. year-old male admitted on 09/26/2015.  The patient is currently on day 5 of antibiotics (vanc/gent/rifampin) for prosthetic valve endocarditis. Scr 1.04, stable with est. crcl ~ 60 - 70 ml/min. Vancomycin trough = 20 drawn ~ 2 hrs early, true trough should be within therapeutic range.   Therapeutic goal: Vancomycin trough = 15-20 mcg/ml Peak = 3-4 mcg/ml Trough < 1 mcg/ml  Assessment/Plan: Continue vancomycin 1250 mg IV Q 12 hrs Gentamicin 80 mg (1mg /kg) IV Q 8 hrs Monitor renal function Will consider Gent Peak and trough tomorrow  Temp (24hrs), Avg:99.4 F (37.4 C), Min:98.4 F (36.9 C), Max:100.8 F (38.2 C)   Recent Labs Lab 09/26/15 0335 09/28/15 1154 09/29/15 0345 09/30/15 0545  WBC 8.9 8.8 7.2 7.0     Recent Labs Lab 09/26/15 0335 09/28/15 1154 09/29/15 0345 09/30/15 0545  CREATININE 1.14 1.10 1.16 1.04   Estimated Creatinine Clearance: 69.2 mL/min (by C-G formula based on Cr of 1.04).    Allergies  Allergen Reactions  . Ultram [Tramadol Hcl]     "seeing things"    Antimicrobials this admission: Vanc 1/25 >> Cefepime 1/25 >> 1/28 Rifampin 1/26 >> Gentamicin 1/28 >>  Levels/dose changes this admission: 1/27 VT = 12 - > increase to 1250 Q 12 1/29 VT = 20 drawn ~ 2 hrs early, true trough should be within therapeutic range  Microbiology results: 1/24 Cx from [facility] - NGTD per report 1/25 MRSA PCR - positive 1/25 Urine neg 1/25 BCx x1 - ngtd 1/26 BCx x2 - ngtd 1/26 AFB cx - ngtd 1/26 fungus cx -   Thank you for allowing pharmacy to be a part of this patient's care.  Bayard Hugger, PharmD, BCPS  Clinical Pharmacist  Pager: [phone]  09/30/2015 11:48 AM

## 2015-10-01 LAB — COMPREHENSIVE METABOLIC PANEL
ALT: 29 U/L (ref 17–63)
AST: 25 U/L (ref 15–41)
Albumin: 2.5 g/dL — ABNORMAL LOW (ref 3.5–5.0)
Alkaline Phosphatase: 170 U/L — ABNORMAL HIGH (ref 38–126)
Anion gap: 8 (ref 5–15)
BUN: 14 mg/dL (ref 6–20)
CO2: 27 mmol/L (ref 22–32)
Calcium: 8.5 mg/dL — ABNORMAL LOW (ref 8.9–10.3)
Chloride: 99 mmol/L — ABNORMAL LOW (ref 101–111)
Creatinine, Ser: 0.97 mg/dL (ref 0.61–1.24)
GFR calc Af Amer: >60 mL/min (ref >60)
GFR calc non Af Amer: >60 mL/min (ref >60)
Glucose, Bld: 96 mg/dL (ref 65–99)
Potassium: 4.1 mmol/L (ref 3.5–5.1)
Sodium: 134 mmol/L — ABNORMAL LOW (ref 135–145)
Total Bilirubin: 1.4 mg/dL — ABNORMAL HIGH (ref 0.3–1.2)
Total Protein: 6.1 g/dL — ABNORMAL LOW (ref 6.5–8.1)

## 2015-10-01 LAB — CULTURE, BLOOD (SINGLE): Culture: NO GROWTH

## 2015-10-01 LAB — CBC
HCT: 31.6 % — ABNORMAL LOW (ref 39.0–52.0)
Hemoglobin: 10.8 g/dL — ABNORMAL LOW (ref 13.0–17.0)
MCH: 30.1 pg (ref 26.0–34.0)
MCHC: 34.2 g/dL (ref 30.0–36.0)
MCV: 88 fL (ref 78.0–100.0)
Platelets: 210 10*3/uL (ref 150–400)
RBC: 3.59 MIL/uL — ABNORMAL LOW (ref 4.22–5.81)
RDW: 13.3 % (ref 11.5–15.5)
WBC: 6.8 10*3/uL (ref 4.0–10.5)

## 2015-10-01 LAB — GLUCOSE, CAPILLARY: GLUCOSE-CAPILLARY: 90 mg/dL (ref 65–99)

## 2015-10-01 MED ORDER — VANCOMYCIN HCL 10 G IV SOLR: 1250.0000 mg | Freq: Two times a day (BID) | INTRAVENOUS | Status: DC

## 2015-10-01 MED ORDER — RIFAMPIN 300 MG PO CAPS: 300.0000 mg | ORAL_CAPSULE | Freq: Three times a day (TID) | ORAL | Status: DC

## 2015-10-01 MED ORDER — GENTAMICIN IN SALINE 1.6-0.9 MG/ML-% IV SOLN: 80.0000 mg | Freq: Three times a day (TID) | INTRAVENOUS | Status: AC

## 2015-10-01 NOTE — Progress Notes (Signed)
I spoke with Dr. Luciana Axe, final ID recommendations are made in his not dated 1/29.  Per Dr. Luciana Axe patient is ok for discharge home with [facility] and IV antibiotics.  Randall An PA paged and notified.  Colman Cater

## 2015-10-01 NOTE — Progress Notes (Signed)
Patient ID: Nicholas Walls, male   DOB: 06/01/1936, 80 y.o.   MRN: [id number]      [address]       [place] [address]             [phone]                 5 Days Post-Op Procedure(s) (LRB): TRANSESOPHAGEAL ECHOCARDIOGRAM (TEE) (N/A)  LOS: 5 days   Subjective: Feels much better, no further rigors, low grade temp to 100 yesterday, fever curve much improved , no heart faulrte symptoms   Objective: Vital signs in last 24 hours: Patient Vitals for the past 24 hrs:  BP Temp Temp src Pulse Resp SpO2 Weight  10/01/15 1535 (!) 117/52 mmHg 98.3 F (36.8 C) Oral 69 20 98 % -  10/01/15 0806 (!) 109/51 mmHg - - 68 - - -  10/01/15 0500 (!) 110/52 mmHg 99.5 F (37.5 C) - 66 (!) 21 94 % 193 lb 1.6 oz (87.59 kg)  09/30/15 2100 (!) 113/42 mmHg (!) 100.7 F (38.2 C) - 80 18 98 % -    Filed Weights   09/29/15 0602 09/30/15 0500 10/01/15 0500  Weight: 194 lb 11.2 oz (88.315 kg) 192 lb 9.6 oz (87.363 kg) 193 lb 1.6 oz (87.59 kg)    Hemodynamic parameters for last 24 hours:    Intake/Output from previous day: 01/29 0701 - 01/30 0700 In: 1500 [P.O.:1500] Out: 3275 [Urine:3275] Intake/Output this shift: Total I/O In: 240 [P.O.:240] Out: -   Scheduled Meds: . aspirin EC  81 mg Oral Daily  . gentamicin  80 mg Intravenous Q8H  . heparin  5,000 Units Subcutaneous 3 times per day  . metoprolol tartrate  12.5 mg Oral Daily  . rifampin  300 mg Oral 3 times per day  . simvastatin  20 mg Oral QHS  . sodium chloride flush  10-40 mL Intracatheter Q12H  . sodium chloride flush  3 mL Intravenous Q12H  . vancomycin  1,250 mg Intravenous Q12H   Continuous Infusions:  PRN Meds:.acetaminophen, ibuprofen, morphine injection, sodium chloride flush, sorbitol, zolpidem  General appearance: alert, cooperative and appears stated age Neurologic: intact Heart: regular rate and rhythm and systolic murmur: early systolic 2/6, crescendo at 2nd left intercostal space Lungs: clear to  auscultation bilaterally Abdomen: soft, non-tender; bowel sounds normal; no masses,  no organomegaly Extremities: extremities normal, atraumatic, no cyanosis or edema, Homans sign is negative, no sign of DVT and no emboli to fingers or toes    Lab Results: CBC: Recent Labs  09/30/15 0545 10/01/15 0535  WBC 7.0 6.8  HGB 10.5* 10.8*  HCT 31.8* 31.6*  PLT 218 210   BMET:  Recent Labs  09/30/15 0545 10/01/15 0535  NA 136 134*  K 3.7 4.1  CL 100* 99*  CO2 28 27  GLUCOSE 96 96  BUN 16 14  CREATININE 1.04 0.97  CALCIUM 8.5* 8.5*    PT/INR: No results for input(s): LABPROT, INR in the last 72 hours.   Radiology US Abdomen Limited Ruq  09/29/2015  CLINICAL DATA:  Patient with elevated bilirubin. Prior cholecystectomy. EXAM: US ABDOMEN LIMITED - RIGHT UPPER QUADRANT COMPARISON:  None. FINDINGS: Gallbladder: Patient status post cholecystectomy. Common bile duct: Diameter: 3 mm Liver: No focal lesion identified. Within normal limits in parenchymal echogenicity. Incidentally identified are 2 probable cyst within the right kidney. IMPRESSION: Status post cholecystectomy. No intrahepatic or extrahepatic biliary ductal dilatation. Electronically Signed   By: Kenard Gower  Earlene Plater M.D.   On: 09/29/2015 19:01     Assessment/Plan: S/P Procedure(s) (LRB): TRANSESOPHAGEAL ECHOCARDIOGRAM (TEE) (N/A) Steady improvement, home antibiotics  Will need follow up TEE at some point in next 6 weeks   [name] MD 10/01/2015 4:14 PM

## 2015-10-01 NOTE — Progress Notes (Signed)
He will need IV vancomycin and oral rifampin 300 mg three times per day for 6 weeks through March 8th and IV gentamcin every 8 hours through February 10th.  Vancomycin trough goal of 15-20 Antibiotics per home health guidelines. Twice weekly bmp, vancomycin trough Weekly cbc, all to [facility] We will arrange follow up in our clinic in 2-3 weeks  Thanks COMER, ROBERT

## 2015-10-01 NOTE — Progress Notes (Signed)
Physical Therapy Treatment Patient Details Name: Nicholas Walls MRN: [id number] DOB: 06-16-1936 Today's Date: 10/01/2015    History of Present Illness Pt is a 80 y/o M s/p aortic valve replacement, prosthetic aortic valve endocarditis, peripheral embolization into kidney w/ resultant renal infarct.  Pt's PMH includes Bil TKA, cervical disc surgery.    PT Comments    Nicholas Walls demonstrated improved balance while ambulating using a cane.  HR very briefly up to 168 while ambulating which quickly went down to 90's after just seconds of rest. He participated in therapeutic exercises to address strength and balance. Pt will benefit from continued skilled PT services to increase functional independence and safety.   Follow Up Recommendations  Home health PT;Supervision for mobility/OOB     Equipment Recommendations  Cane    Recommendations for Other Services       Precautions / Restrictions Precautions Precautions: Fall Restrictions Weight Bearing Restrictions: No    Mobility  Bed Mobility               General bed mobility comments: Pt sitting EOB upon PT arrival  Transfers Overall transfer level: Needs assistance Equipment used: Straight cane;None Transfers: Sit to/from Stand Sit to Stand: Supervision         General transfer comment: Supervision, pt w/ safe technique.  No instability noted  Ambulation/Gait Ambulation/Gait assistance: Min guard Ambulation Distance (Feet): 100 Feet Assistive device: Straight cane Gait Pattern/deviations: Step-through pattern;Decreased stride length   Gait velocity interpretation: Below normal speed for age/gender General Gait Details: Very min instability w/ high level balance activities while using cane.  HR up to 168 but quickly back down to 90's after a few seconds of rest. Pt not symptomatic.   Stairs            Wheelchair Mobility    Modified Rankin (Stroke Patients Only)       Balance Overall balance  assessment: Needs assistance Sitting-balance support: No upper extremity supported;Feet supported Sitting balance-Leahy Scale: Good     Standing balance support: Single extremity supported;During functional activity Standing balance-Leahy Scale: Fair   Single Leg Stance - Right Leg: 30 (support of two fingers) Single Leg Stance - Left Leg: 30 (support of two fingers)     Rhomberg - Eyes Opened: 30 (close min guard assist) Rhomberg - Eyes Closed: 30 (support of two fingers) High level balance activites: Sudden stops;Head turns;Turns;Other (comment) (marching while ambulating) High Level Balance Comments: Very min instability w/ marching and head turns    Cognition Arousal/Alertness: Awake/alert Behavior During Therapy: WFL for tasks assessed/performed Overall Cognitive Status: Within Functional Limits for tasks assessed                      Exercises General Exercises - Lower Extremity Hip Flexion/Marching: Strengthening;Both;10 reps;Standing Mini-Sqauts: Strengthening;Both;10 reps;Standing    General Comments        Pertinent Vitals/Pain Pain Assessment: No/denies pain    Home Living                      Prior Function            PT Goals (current goals can now be found in the care plan section) Acute Rehab PT Goals Patient Stated Goal: to go home  PT Goal Formulation: With patient/family Time For Goal Achievement: 10/06/15 Potential to Achieve Goals: Good Progress towards PT goals: Progressing toward goals    Frequency  Min 3X/week    PT Plan Current plan  remains appropriate    Co-evaluation             End of Session Equipment Utilized During Treatment: Gait belt Activity Tolerance: Patient tolerated treatment well;Patient limited by fatigue Patient left: in chair;with call bell/phone within reach;with family/visitor present     Time: 4098-1191 PT Time Calculation (min) (ACUTE ONLY): 19 min  Charges:  $Therapeutic Exercise:  8-22 mins                    G Codes:      Michail Jewels PT, [place] [phone] Pager: [phone] 10/01/2015, 4:15 PM

## 2015-10-01 NOTE — Progress Notes (Signed)
     SUBJECTIVE: No complaints this am. Temp 100.7 overnight.   BP 109/51 mmHg  Pulse 68  Temp(Src) 99.5 F (37.5 C) (Oral)  Resp 21  Ht 6\' 2"  (1.88 m)  Wt 193 lb 1.6 oz (87.59 kg)  BMI 24.78 kg/m2  SpO2 94%  Intake/Output Summary (Last 24 hours) at 10/01/15 0925 Last data filed at 10/01/15 0553  Gross per 24 hour  Intake    960 ml  Output   3050 ml  Net  -2090 ml    PHYSICAL EXAM General: Well developed, well nourished, in no acute distress. Alert and oriented x 3.  Psych:  Good affect, responds appropriately Neck: No JVD. No masses noted.  Lungs: Clear bilaterally with no wheezes or rhonci noted.  Heart: RRR with no murmurs noted. Abdomen: Bowel sounds are present. Soft, non-tender.  Extremities: No lower extremity edema.   LABS: Basic Metabolic Panel:  Recent Labs  94/71/25 0545 10/01/15 0535  NA 136 134*  K 3.7 4.1  CL 100* 99*  CO2 28 27  GLUCOSE 96 96  BUN 16 14  CREATININE 1.04 0.97  CALCIUM 8.5* 8.5*   CBC:  Recent Labs  09/30/15 0545 10/01/15 0535  WBC 7.0 6.8  HGB 10.5* 10.8*  HCT 31.8* 31.6*  MCV 88.1 88.0  PLT 218 210   Current Meds: . aspirin EC  81 mg Oral Daily  . gentamicin  80 mg Intravenous Q8H  . heparin  5,000 Units Subcutaneous 3 times per day  . metoprolol tartrate  12.5 mg Oral Daily  . rifampin  300 mg Oral 3 times per day  . simvastatin  20 mg Oral QHS  . sodium chloride flush  10-40 mL Intracatheter Q12H  . sodium chloride flush  3 mL Intravenous Q12H  . vancomycin  1,250 mg Intravenous Q12H     ASSESSMENT AND PLAN: 80 y.o. white male with no prior history of CAD but has history of bicuspid aortic valve related severe aortic stenosis s/p AVR w/ pericardial tissue valve (10/13/11 by Dr. Tyrone Sage), dyslipidemia, pulmonary emphysema, hiatal hernia, cholecystectomy and sleep apnea not on CPAP transferred to [facility] from [facility] with abdominal pain. He was found a vegetation on his pericardial tissue AVR by TEE  09/26/15. He has been treated with IV antibiotics. ID service is following. He has been seen by Dr. Tyrone Sage.   1. Prosthetic aortic valve endocarditis: Low grade fever last night at 100.7. The vegetation seen on TEE 09/26/15 was not seen on surface echo 09/27/15 but this was a different study. TEE not repeated. The ID service is following. Cultures growing MRSE and will need vancomycin and rifampin for 6 weeks and gentamcin q 8 hours for 2 weeks. Dr Tyrone Sage is also following and no surgical intervention is planned. PICC line now in place. Await recs from ID team. Will work with case management for [facility] for antibiotics.   2. Renal infarction: Creatinine stable.  3. Renal mass: Likely cyst per radiologist report. Outpatient follow up, UA has been benign without hematuria  4. Elevated bilirubin: Continue to decrease. RUQ ultrasound was unrevealing.   Dispo: Should be ready for d/c soon with HHRN. Pending final recs from ID team.      MCALHANY,CHRISTOPHER  1/30/20179:25 AM

## 2015-10-02 ENCOUNTER — Other Ambulatory Visit: Payer: Self-pay

## 2015-10-02 DIAGNOSIS — J439 Emphysema, unspecified: Secondary | ICD-10-CM | POA: Diagnosis present

## 2015-10-02 DIAGNOSIS — M6281 Muscle weakness (generalized): Secondary | ICD-10-CM | POA: Diagnosis not present

## 2015-10-02 DIAGNOSIS — R109 Unspecified abdominal pain: Secondary | ICD-10-CM | POA: Diagnosis present

## 2015-10-02 DIAGNOSIS — T826XXD Infection and inflammatory reaction due to cardiac valve prosthesis, subsequent encounter: Secondary | ICD-10-CM | POA: Diagnosis not present

## 2015-10-02 DIAGNOSIS — I309 Acute pericarditis, unspecified: Secondary | ICD-10-CM | POA: Diagnosis not present

## 2015-10-02 DIAGNOSIS — Z452 Encounter for adjustment and management of vascular access device: Secondary | ICD-10-CM | POA: Diagnosis not present

## 2015-10-02 DIAGNOSIS — E785 Hyperlipidemia, unspecified: Secondary | ICD-10-CM | POA: Diagnosis not present

## 2015-10-02 DIAGNOSIS — Z22322 Carrier or suspected carrier of Methicillin resistant Staphylococcus aureus: Secondary | ICD-10-CM

## 2015-10-02 DIAGNOSIS — Z0389 Encounter for observation for other suspected diseases and conditions ruled out: Secondary | ICD-10-CM

## 2015-10-02 DIAGNOSIS — IMO0001 Reserved for inherently not codable concepts without codable children: Secondary | ICD-10-CM

## 2015-10-02 LAB — CBC
HCT: 30.8 % — ABNORMAL LOW (ref 39.0–52.0)
Hemoglobin: 10.3 g/dL — ABNORMAL LOW (ref 13.0–17.0)
MCH: 29.4 pg (ref 26.0–34.0)
MCHC: 33.4 g/dL (ref 30.0–36.0)
MCV: 88 fL (ref 78.0–100.0)
Platelets: 250 10*3/uL (ref 150–400)
RBC: 3.5 MIL/uL — ABNORMAL LOW (ref 4.22–5.81)
RDW: 13.5 % (ref 11.5–15.5)
WBC: 6.7 10*3/uL (ref 4.0–10.5)

## 2015-10-02 LAB — CULTURE, BLOOD (ROUTINE X 2)
Culture: NO GROWTH
Culture: NO GROWTH

## 2015-10-02 LAB — COMPREHENSIVE METABOLIC PANEL
ALT: 34 U/L (ref 17–63)
AST: 29 U/L (ref 15–41)
Albumin: 2.4 g/dL — ABNORMAL LOW (ref 3.5–5.0)
Alkaline Phosphatase: 189 U/L — ABNORMAL HIGH (ref 38–126)
Anion gap: 9 (ref 5–15)
BUN: 12 mg/dL (ref 6–20)
CO2: 27 mmol/L (ref 22–32)
Calcium: 8.5 mg/dL — ABNORMAL LOW (ref 8.9–10.3)
Chloride: 98 mmol/L — ABNORMAL LOW (ref 101–111)
Creatinine, Ser: 0.99 mg/dL (ref 0.61–1.24)
GFR calc Af Amer: >60 mL/min (ref >60)
GFR calc non Af Amer: >60 mL/min (ref >60)
Glucose, Bld: 95 mg/dL (ref 65–99)
Potassium: 3.9 mmol/L (ref 3.5–5.1)
Sodium: 134 mmol/L — ABNORMAL LOW (ref 135–145)
Total Bilirubin: 1.3 mg/dL — ABNORMAL HIGH (ref 0.3–1.2)
Total Protein: 6.1 g/dL — ABNORMAL LOW (ref 6.5–8.1)

## 2015-10-02 LAB — GLUCOSE, CAPILLARY: Glucose-Capillary: 91 mg/dL (ref 65–99)

## 2015-10-02 MED ORDER — IBUPROFEN 400 MG PO TABS: 400.0000 mg | ORAL_TABLET | Freq: Four times a day (QID) | ORAL | Status: DC | PRN

## 2015-10-02 MED ORDER — ACETAMINOPHEN 325 MG PO TABS: 650.0000 mg | ORAL_TABLET | Freq: Four times a day (QID) | ORAL | Status: DC | PRN

## 2015-10-02 MED ORDER — RIFAMPIN 300 MG PO CAPS: 300.0000 mg | ORAL_CAPSULE | Freq: Three times a day (TID) | ORAL | Status: DC

## 2015-10-02 MED ORDER — HEPARIN SOD (PORK) LOCK FLUSH 100 UNIT/ML IV SOLN
250.0000 [IU] | INTRAVENOUS | Status: AC | PRN
  Administered 2015-10-02: 250 [IU]
  Filled 2015-10-02: qty 3

## 2015-10-02 NOTE — Discharge Summary (Signed)
Discharge Summary    Patient ID: Nicholas Walls,  MRN: [id number], DOB/AGE: April 05, 1936 80 y.o.  Admit date: 09/26/2015 Discharge date: 10/02/2015  Primary Care Provider: [facility] Primary Cardiologist: Dr Patty Sermons  Discharge Diagnoses    Principal Problem:   Abdominal pain Active Problems:   Renal infarction [facility])   Prosthetic valve endocarditis (HCC)   S/P aortic valve replacement with bioprosthetic valve   MRSA (methicillin resistant Staphylococcus aureus) carrier   Sleep apnea   Dyslipidemia   Anemia   Lymph node enlargement   Renal cyst   Alkaline phosphatase elevation (statin stopped)   Normal coronary arteries   Emphysema/COPD (HCC)   Allergies Allergies  Allergen Reactions  . Ultram [Tramadol Hcl]     "seeing things"    Diagnostic Studies/Procedures    TEE 09/26/15 TTE 09/27/15 Abd [place] 09/29/15 _____________   History of Present Illness     80 y.o.white male followed by Dr Patty Sermons with a history of non rheumatic calcific severe aortic stenosis s/p AVR w/ pericardial tissue valve (10/13/11 by Dr. Tyrone Sage), normal coronaries, thoracic aortic aneurysm (2.9 cm), dyslipidemia, pulmonary emphysema, hiatal hernia, s/p cholecystectomy and sleep apnea- not on CPAP (mask does not fit well) who presented 09/25/15 to [facility] [place] with abdominal pain. Patient was in usual state of health until 09/25/15 when he was watching TV and suddenly he felt severe abdominal pain of sudden onset. Initially the ED physician thought he has pneumonia and obtained CTA for PE which showed renal infarction. Pt was transferred to [facility] 09/26/15 for further evaluation.   Hospital Course    Pt was admitted and evaluated for suspected prosthetic aortic valve endocarditis. Pt was started on vancomycin and cefepime empirically after obtaining blood cultures. TEE done 09/26/15 showed 2.5 cm x 0.5 cm filamentous, mobile vegetation attached to the bioprosthetic aortic  valve. EF was 55-60%. The pt was seen in consult by Dr Myer Haff 09/26/15 who felt there no indication for surgical intervention and suggested an ID consult. Pt was seen by Dr Luciana Axe 09/27/15 who recommended continuing current antibiotics (Vancomycin and cefepime). Gentamycin was added once it was clear his SCr was stable post IV contrast and renal infarction. Blood cultures were negative but it was suspected the pt had received antibiotics in [place] before cultures were drawn there. A PICC line was placed 09/30/15 and Care Manager was contacted to help with arrangements for OP f/u. Other issues followed were renal mass seen on original CT, a follow up abdominal US done 09/29/15 which showed renal cysts. There was an enlarged pretracheal lymph node-19 mm that will need OP f/u. The pt had elevated bilirubin with normal LFTs, abdominal US was unrevealing. If he continues to have elevated LFTs as an OP he may need GI consult. The pt continued to have low grade fevers till 10/01/15. On 10/01/15 the pt was seen by Dr  Luciana Axe and final OP recommendations were outlined. He will need IV vancomycin and oral rifampin 300 mg three times per day for 6 weeks through March 8th and IV gentamcin every 8 hours through February 10th. Vancomycin trough goal of 15-20. He will need twice weekly bmp and vancomycin trough levels. He will need weekly cbc.  All results to go to to [facility] for Infectious Disease- Dr Luciana Axe). Dr Luciana Axe to will arrange follow up in his clinic in 2-3 weeks. He should see Dr Patty Sermons after he sees Dr Luciana Axe and this will be arranged.     Consultants:  Dr Luciana Axe ID                       Dr Tyrone Sage Surgery  _____________  Discharge Vitals Blood pressure 110/54, pulse 77, temperature 98.4 F (36.9 C), temperature source Oral, resp. rate 16, height 6\' 2"  (1.88 m), weight 191 lb 1.6 oz (86.682 kg), SpO2 93 %.  Filed Weights   09/30/15 0500 10/01/15 0500 10/02/15 0607  Weight: 192 lb 9.6 oz  (87.363 kg) 193 lb 1.6 oz (87.59 kg) 191 lb 1.6 oz (86.682 kg)    Labs & Radiologic Studies     CBC  Recent Labs  10/01/15 0535 10/02/15 0525  WBC 6.8 6.7  HGB 10.8* 10.3*  HCT 31.6* 30.8*  MCV 88.0 88.0  PLT 210 250   Basic Metabolic Panel  Recent Labs  10/01/15 0535 10/02/15 0525  NA 134* 134*  K 4.1 3.9  CL 99* 98*  CO2 27 27  GLUCOSE 96 95  BUN 14 12  CREATININE 0.97 0.99  CALCIUM 8.5* 8.5*   Liver Function Tests  Recent Labs  10/01/15 0535 10/02/15 0525  AST 25 29  ALT 29 34  ALKPHOS 170* 189*  BILITOT 1.4* 1.3*  PROT 6.1* 6.1*  ALBUMIN 2.5* 2.4*   No results for input(s): LIPASE, AMYLASE in the last 72 hours. Cardiac Enzymes No results for input(s): CKTOTAL, CKMB, CKMBINDEX, TROPONINI in the last 72 hours. BNP Invalid input(s): POCBNP D-Dimer No results for input(s): DDIMER in the last 72 hours. Hemoglobin A1C No results for input(s): HGBA1C in the last 72 hours. Fasting Lipid Panel No results for input(s): CHOL, HDL, LDLCALC, TRIG, CHOLHDL, LDLDIRECT in the last 72 hours. Thyroid Function Tests No results for input(s): TSH, T4TOTAL, T3FREE, THYROIDAB in the last 72 hours.  Invalid input(s): FREET3  US Abdomen Limited Ruq  09/29/2015  CLINICAL DATA:  Patient with elevated bilirubin. Prior cholecystectomy. EXAM: US ABDOMEN LIMITED - RIGHT UPPER QUADRANT COMPARISON:  None. FINDINGS: Gallbladder: Patient status post cholecystectomy. Common bile duct: Diameter: 3 mm Liver: No focal lesion identified. Within normal limits in parenchymal echogenicity. Incidentally identified are 2 probable cyst within the right kidney. IMPRESSION: Status post cholecystectomy. No intrahepatic or extrahepatic biliary ductal dilatation. Electronically Signed   By: Annia Belt M.D.   On: 09/29/2015 19:01    Disposition   Pt is being discharged home today in good condition.  Follow-up Plans & Appointments    Follow-up Information    Follow up with [facility].   Contact information:   [address] [place] [address] [phone]       Follow up with Ronny Flurry, MD.   Specialty:  Cardiology   Why:  office will contact you   Contact information:   [address] [place] [address] [phone]        Discharge Medications   Current Discharge Medication List    START taking these medications   Details  acetaminophen (TYLENOL) 325 MG tablet Take 2 tablets (650 mg total) by mouth every 6 (six) hours as needed for moderate pain, fever or headache.    gentamicin (GARAMYCIN) 1.6-0.9 MG/ML-% Inject 50 mLs (80 mg total) into the vein every 8 (eight) hours. Qty: 50 mL, Refills: 32    ibuprofen (ADVIL,MOTRIN) 400 MG tablet Take 1 tablet (400 mg total) by mouth every 6 (six) hours as needed for fever. Qty: 30 tablet, Refills: 0    rifampin (RIFADIN) 300 MG  capsule Take 1 capsule (300 mg total) by mouth every 8 (eight) hours. Qty: 90 capsule, Refills: 3    vancomycin 1,250 mg in sodium chloride 0.9 % 250 mL Inject 1,250 mg into the vein every 12 (twelve) hours. Trough Goal of 15-20. Qty: 1250 mg, Refills: 80      CONTINUE these medications which have NOT CHANGED   Details  fish oil-omega-3 fatty acids 1000 MG capsule Take 3 g by mouth daily.     metoprolol tartrate (LOPRESSOR) 25 MG tablet Take 12.5 mg by mouth daily.    aspirin 81 MG tablet Take 81 mg by mouth daily. Reported on 09/26/2015      STOP taking these medications     amoxicillin (AMOXIL) 500 MG capsule      naproxen sodium (ANAPROX) 220 MG tablet      simvastatin (ZOCOR) 20 MG tablet          Aspirin prescribed at discharge?  Yes High Intensity Statin Prescribed? (Lipitor 40-80mg  or Crestor 20-40mg ): No: No CAD and pt has elevated LFTs Beta Blocker Prescribed? No: No CAD For EF 45% or less, Was ACEI/ARB Prescribed? No:N/A ADP Receptor Inhibitor Prescribed? (i.e. Plavix etc.-Includes Medically Managed Patients): No:  N/A For EF <40%, Aldosterone Inhibitor Prescribed? No: N/A Was EF assessed during THIS hospitalization? Yes Was Cardiac Rehab II ordered? (Included Medically managed Patients): No: N/A   Outstanding Labs/Studies     Duration of Discharge Encounter   Greater than 30 minutes including physician time.  Jolene Provost K PA 10/02/2015, 9:57 AM

## 2015-10-02 NOTE — Progress Notes (Signed)
SUBJECTIVE: No complaints this am.  Afebrile overnight.   BP 110/54 mmHg  Pulse 73  Temp(Src) 98.4 F (36.9 C) (Oral)  Resp 16  Ht  (1.88 m)  Wt 191 lb 1.6 oz (86.682 kg)  BMI 24.53 kg/m2  SpO2 93%  Intake/Output Summary (Last 24 hours) at 10/02/15 0850 Last data filed at 10/02/15 0600  Gross per 24 hour  Intake    830 ml  Output   1400 ml  Net   -570 ml    PHYSICAL EXAM General: Well developed, well nourished, in no acute distress. Alert and oriented x 3.  Psych:  Good affect, responds appropriately Neck: No JVD. No masses noted.  Lungs: Clear bilaterally with no wheezes or rhonci noted.  Heart: RRR short systolic murmur AOV Abdomen: Bowel sounds are present. Soft, non-tender.  Extremities: No lower extremity edema.   LABS: Basic Metabolic Panel:  Recent Labs  16/10/96 0535 10/02/15 0525  NA 134* 134*  Nicholas Walls 4.1 3.9  CL 99* 98*  CO2 27 27  GLUCOSE 96 95  BUN 14 12  CREATININE 0.97 0.99  CALCIUM 8.5* 8.5*   CBC:  Recent Labs  10/01/15 0535 10/02/15 0525  WBC 6.8 6.7  HGB 10.8* 10.3*  HCT 31.6* 30.8*  MCV 88.0 88.0  PLT 210 250   Current Meds: . aspirin EC  81 mg Oral Daily  . gentamicin  80 mg Intravenous Q8H  . heparin  5,000 Units Subcutaneous 3 times per day  . metoprolol tartrate  12.5 mg Oral Daily  . rifampin  300 mg Oral 3 times per day  . simvastatin  20 mg Oral QHS  . sodium chloride flush  10-40 mL Intracatheter Q12H  . sodium chloride flush  3 mL Intravenous Q12H  . vancomycin  1,250 mg Intravenous Q12H     ASSESSMENT AND PLAN: 80 y.o. white male with no prior history of CAD but has history of bicuspid aortic valve related severe aortic stenosis s/p AVR w/ pericardial tissue valve (10/13/11 by Dr. Tyrone Sage), dyslipidemia, pulmonary emphysema, hiatal hernia, cholecystectomy and sleep apnea not on CPAP transferred to [facility] from [facility] with abdominal pain. He was found a vegetation on his pericardial tissue AVR by  TEE 09/26/15. He has been treated with IV antibiotics. ID service is following. He has been seen by Dr. Tyrone Sage.   1. Prosthetic aortic valve endocarditis:  The vegetation seen on TEE 09/26/15 was not seen on surface echo 09/27/15 but this was a different study. TEE not repeated. The ID service is following. Cultures growing MRSE and will need vancomycin and rifampin for 6 weeks and gentamcin q 8 hours for 2 weeks. Dr Tyrone Sage is also following and no surgical intervention is planned. PICC line now in place. ID recommendations noted, HHRN for antibiotics and labs with results to go to ID.   2. Renal infarction: Creatinine stable.  3. Renal mass: Likely cyst per radiologist report. Outpatient follow up, UA has been benign without hematuria  4. Elevated bilirubin: Continue to decrease. RUQ ultrasound was unrevealing.   Dispo: Should be ready for d/c,      Nicholas Nicholas Walls,Nicholas K PA  1/31/20178:50 AM   I have personally seen and examined this patient with [name], PA-C. I agree with the assessment and plan as outlined above. He is stable this am. Plans as above for [facility] for infusion IV antibiotics. Follow up with Dr. Patty Sermons 2 weeks and ID as above. Will need repeat TEE  in 6 weeks.   [name] 10/02/2015 9:45 AM

## 2015-10-02 NOTE — Progress Notes (Signed)
Physical Therapy Treatment Patient Details Name: Nicholas Walls MRN: [id number] DOB: [birth year] Today's Date: 10/02/2015    History of Present Illness Pt is a 80 y/o M s/p aortic valve replacement, prosthetic aortic valve endocarditis, peripheral embolization into kidney w/ resultant renal infarct.  Pt's PMH includes Bil TKA, cervical disc surgery.    PT Comments    Pt performed increased activity with stable HR.  Pt tolerated increased gait distance and standing therapeutic exercise to improve strength and balance with gait.  Pt and family educated on use of cane, where to buy and how to size correctly.  Patient's daughter able to teach back method to size cane to PTA.    Follow Up Recommendations  Home health PT;Supervision for mobility/OOB     Equipment Recommendations  Gilmer Mor (Discussed with family where to obtain cane and how to size cane for safe use.  )    Recommendations for Other Services       Precautions / Restrictions Precautions Precautions: Fall Restrictions Weight Bearing Restrictions: No    Mobility  Bed Mobility Overal bed mobility: Independent             General bed mobility comments: Pt does not requires cues to complete task.    Transfers   Equipment used: Straight cane Transfers: Sit to/from Stand Sit to Stand: Supervision         General transfer comment: Cues for hand placement vs. relying on cane for supoprt to ascend and descend from seated surface.    Ambulation/Gait Ambulation/Gait assistance: Supervision Ambulation Distance (Feet): 236 Feet Assistive device: Straight cane Gait Pattern/deviations: Step-through pattern;Staggering right;Staggering left     General Gait Details: Pt demonstrates good reciprocal armswing with LUE.  Pt requires cues intermittently to improve and maintain sequencing with SPC.  HR remains stable during intervention.    Stairs            Wheelchair Mobility    Modified Rankin (Stroke Patients  Only)       Balance     Sitting balance-Leahy Scale: Normal       Standing balance-Leahy Scale: Fair                      Cognition Arousal/Alertness: Awake/alert Behavior During Therapy: WFL for tasks assessed/performed Overall Cognitive Status: Within Functional Limits for tasks assessed                      Exercises General Exercises - Lower Extremity Hip ABduction/ADduction: AROM;Standing;Both;10 reps Hip Flexion/Marching: 10 reps;Both;Standing;AROM Heel Raises: Standing;Both;10 reps;AROM Mini-Sqauts: AROM;Both;Standing;10 reps    General Comments        Pertinent Vitals/Pain Pain Assessment: No/denies pain    Home Living                      Prior Function            PT Goals (current goals can now be found in the care plan section) Acute Rehab PT Goals Patient Stated Goal: to go home  Potential to Achieve Goals: Good Progress towards PT goals: Progressing toward goals    Frequency  Min 3X/week    PT Plan Current plan remains appropriate    Co-evaluation             End of Session Equipment Utilized During Treatment: Gait belt Activity Tolerance: Patient tolerated treatment well;Patient limited by fatigue Patient left: with call bell/phone within reach;with family/visitor present;in bed  Time: 4098-1191 PT Time Calculation (min) (ACUTE ONLY): 19 min  Charges:  $Therapeutic Exercise: 8-22 mins                    G Codes:      Florestine Avers 2015/10/16, 9:56 AM  Joycelyn Rua, PTA pager [phone]

## 2015-10-02 NOTE — Discharge Instructions (Signed)

## 2015-10-03 ENCOUNTER — Telehealth: Payer: Self-pay | Admitting: Cardiology

## 2015-10-03 DIAGNOSIS — I309 Acute pericarditis, unspecified: Secondary | ICD-10-CM | POA: Diagnosis not present

## 2015-10-03 DIAGNOSIS — Z452 Encounter for adjustment and management of vascular access device: Secondary | ICD-10-CM | POA: Diagnosis not present

## 2015-10-03 DIAGNOSIS — M6281 Muscle weakness (generalized): Secondary | ICD-10-CM | POA: Diagnosis not present

## 2015-10-03 DIAGNOSIS — E785 Hyperlipidemia, unspecified: Secondary | ICD-10-CM | POA: Diagnosis not present

## 2015-10-03 DIAGNOSIS — J439 Emphysema, unspecified: Secondary | ICD-10-CM | POA: Diagnosis not present

## 2015-10-03 DIAGNOSIS — T826XXD Infection and inflammatory reaction due to cardiac valve prosthesis, subsequent encounter: Secondary | ICD-10-CM | POA: Diagnosis not present

## 2015-10-03 NOTE — Telephone Encounter (Signed)
New Message  Rn from [facility] [place]- calling to do med reconciliation. Please call back and discuss.

## 2015-10-03 NOTE — Telephone Encounter (Signed)
Spoke with [facility] Patient was sent home with orders to get BMET/Vancomycin levels twice a week and cbc weekly HHN unsure of when to start those orders and where to send Discussed with  Dr. Patty Sermons and will have her get BMET/vanc level in am prior to 9:00 dose Needs to be faxed to Dr Luciana Axe at [phone] Left 2 messages for [employer] to call back

## 2015-10-03 NOTE — Telephone Encounter (Signed)
Left message to call back  

## 2015-10-04 DIAGNOSIS — E785 Hyperlipidemia, unspecified: Secondary | ICD-10-CM | POA: Diagnosis not present

## 2015-10-04 DIAGNOSIS — M6281 Muscle weakness (generalized): Secondary | ICD-10-CM | POA: Diagnosis not present

## 2015-10-04 DIAGNOSIS — T826XXD Infection and inflammatory reaction due to cardiac valve prosthesis, subsequent encounter: Secondary | ICD-10-CM | POA: Diagnosis not present

## 2015-10-04 DIAGNOSIS — J439 Emphysema, unspecified: Secondary | ICD-10-CM | POA: Diagnosis not present

## 2015-10-04 DIAGNOSIS — I319 Disease of pericardium, unspecified: Secondary | ICD-10-CM | POA: Diagnosis not present

## 2015-10-04 DIAGNOSIS — I309 Acute pericarditis, unspecified: Secondary | ICD-10-CM | POA: Diagnosis not present

## 2015-10-04 DIAGNOSIS — Z452 Encounter for adjustment and management of vascular access device: Secondary | ICD-10-CM | POA: Diagnosis not present

## 2015-10-04 LAB — FUNGUS CULTURE, BLOOD: Culture: NO GROWTH

## 2015-10-04 NOTE — Telephone Encounter (Signed)
Spoke with Lawson Fiscal and advised of recommendations

## 2015-10-05 DIAGNOSIS — Z452 Encounter for adjustment and management of vascular access device: Secondary | ICD-10-CM | POA: Diagnosis not present

## 2015-10-05 DIAGNOSIS — E785 Hyperlipidemia, unspecified: Secondary | ICD-10-CM | POA: Diagnosis not present

## 2015-10-05 DIAGNOSIS — J439 Emphysema, unspecified: Secondary | ICD-10-CM | POA: Diagnosis not present

## 2015-10-05 DIAGNOSIS — T826XXD Infection and inflammatory reaction due to cardiac valve prosthesis, subsequent encounter: Secondary | ICD-10-CM | POA: Diagnosis not present

## 2015-10-05 DIAGNOSIS — I309 Acute pericarditis, unspecified: Secondary | ICD-10-CM | POA: Diagnosis not present

## 2015-10-05 DIAGNOSIS — M6281 Muscle weakness (generalized): Secondary | ICD-10-CM | POA: Diagnosis not present

## 2015-10-08 DIAGNOSIS — M6281 Muscle weakness (generalized): Secondary | ICD-10-CM | POA: Diagnosis not present

## 2015-10-08 DIAGNOSIS — T826XXD Infection and inflammatory reaction due to cardiac valve prosthesis, subsequent encounter: Secondary | ICD-10-CM | POA: Diagnosis not present

## 2015-10-08 DIAGNOSIS — I319 Disease of pericardium, unspecified: Secondary | ICD-10-CM | POA: Diagnosis not present

## 2015-10-08 DIAGNOSIS — E785 Hyperlipidemia, unspecified: Secondary | ICD-10-CM | POA: Diagnosis not present

## 2015-10-08 DIAGNOSIS — I309 Acute pericarditis, unspecified: Secondary | ICD-10-CM | POA: Diagnosis not present

## 2015-10-08 DIAGNOSIS — J439 Emphysema, unspecified: Secondary | ICD-10-CM | POA: Diagnosis not present

## 2015-10-08 DIAGNOSIS — Z452 Encounter for adjustment and management of vascular access device: Secondary | ICD-10-CM | POA: Diagnosis not present

## 2015-10-10 DIAGNOSIS — Z452 Encounter for adjustment and management of vascular access device: Secondary | ICD-10-CM | POA: Diagnosis not present

## 2015-10-10 DIAGNOSIS — E785 Hyperlipidemia, unspecified: Secondary | ICD-10-CM | POA: Diagnosis not present

## 2015-10-10 DIAGNOSIS — T826XXD Infection and inflammatory reaction due to cardiac valve prosthesis, subsequent encounter: Secondary | ICD-10-CM | POA: Diagnosis not present

## 2015-10-10 DIAGNOSIS — Z792 Long term (current) use of antibiotics: Secondary | ICD-10-CM | POA: Diagnosis not present

## 2015-10-10 DIAGNOSIS — I309 Acute pericarditis, unspecified: Secondary | ICD-10-CM | POA: Diagnosis not present

## 2015-10-10 DIAGNOSIS — J439 Emphysema, unspecified: Secondary | ICD-10-CM | POA: Diagnosis not present

## 2015-10-10 DIAGNOSIS — M6281 Muscle weakness (generalized): Secondary | ICD-10-CM | POA: Diagnosis not present

## 2015-10-12 DIAGNOSIS — Z452 Encounter for adjustment and management of vascular access device: Secondary | ICD-10-CM | POA: Diagnosis not present

## 2015-10-15 DIAGNOSIS — E785 Hyperlipidemia, unspecified: Secondary | ICD-10-CM | POA: Diagnosis not present

## 2015-10-15 DIAGNOSIS — J439 Emphysema, unspecified: Secondary | ICD-10-CM | POA: Diagnosis not present

## 2015-10-15 DIAGNOSIS — Z792 Long term (current) use of antibiotics: Secondary | ICD-10-CM | POA: Diagnosis not present

## 2015-10-15 DIAGNOSIS — I309 Acute pericarditis, unspecified: Secondary | ICD-10-CM | POA: Diagnosis not present

## 2015-10-15 DIAGNOSIS — T826XXD Infection and inflammatory reaction due to cardiac valve prosthesis, subsequent encounter: Secondary | ICD-10-CM | POA: Diagnosis not present

## 2015-10-15 DIAGNOSIS — Z452 Encounter for adjustment and management of vascular access device: Secondary | ICD-10-CM | POA: Diagnosis not present

## 2015-10-15 DIAGNOSIS — M6281 Muscle weakness (generalized): Secondary | ICD-10-CM | POA: Diagnosis not present

## 2015-10-17 ENCOUNTER — Ambulatory Visit: Payer: Medicare Other | Admitting: Cardiology

## 2015-10-18 DIAGNOSIS — Z452 Encounter for adjustment and management of vascular access device: Secondary | ICD-10-CM | POA: Diagnosis not present

## 2015-10-18 DIAGNOSIS — J449 Chronic obstructive pulmonary disease, unspecified: Secondary | ICD-10-CM | POA: Diagnosis not present

## 2015-10-18 DIAGNOSIS — T826XXA Infection and inflammatory reaction due to cardiac valve prosthesis, initial encounter: Secondary | ICD-10-CM | POA: Diagnosis not present

## 2015-10-18 DIAGNOSIS — B9562 Methicillin resistant Staphylococcus aureus infection as the cause of diseases classified elsewhere: Secondary | ICD-10-CM | POA: Diagnosis not present

## 2015-10-18 DIAGNOSIS — D649 Anemia, unspecified: Secondary | ICD-10-CM | POA: Diagnosis not present

## 2015-10-18 DIAGNOSIS — M1990 Unspecified osteoarthritis, unspecified site: Secondary | ICD-10-CM | POA: Diagnosis not present

## 2015-10-22 DIAGNOSIS — N280 Ischemia and infarction of kidney: Secondary | ICD-10-CM | POA: Diagnosis not present

## 2015-10-22 DIAGNOSIS — T826XXA Infection and inflammatory reaction due to cardiac valve prosthesis, initial encounter: Secondary | ICD-10-CM | POA: Diagnosis not present

## 2015-10-22 DIAGNOSIS — M1990 Unspecified osteoarthritis, unspecified site: Secondary | ICD-10-CM | POA: Diagnosis not present

## 2015-10-22 DIAGNOSIS — I38 Endocarditis, valve unspecified: Secondary | ICD-10-CM | POA: Diagnosis not present

## 2015-10-22 DIAGNOSIS — Z452 Encounter for adjustment and management of vascular access device: Secondary | ICD-10-CM | POA: Diagnosis not present

## 2015-10-22 DIAGNOSIS — Q6101 Congenital single renal cyst: Secondary | ICD-10-CM | POA: Diagnosis not present

## 2015-10-22 DIAGNOSIS — J449 Chronic obstructive pulmonary disease, unspecified: Secondary | ICD-10-CM | POA: Diagnosis not present

## 2015-10-22 DIAGNOSIS — D649 Anemia, unspecified: Secondary | ICD-10-CM | POA: Diagnosis not present

## 2015-10-22 DIAGNOSIS — Z792 Long term (current) use of antibiotics: Secondary | ICD-10-CM | POA: Diagnosis not present

## 2015-10-22 DIAGNOSIS — B9562 Methicillin resistant Staphylococcus aureus infection as the cause of diseases classified elsewhere: Secondary | ICD-10-CM | POA: Diagnosis not present

## 2015-10-23 ENCOUNTER — Encounter: Payer: Self-pay | Admitting: Infectious Diseases

## 2015-10-23 ENCOUNTER — Ambulatory Visit (INDEPENDENT_AMBULATORY_CARE_PROVIDER_SITE_OTHER): Payer: Medicare Other | Admitting: Infectious Diseases

## 2015-10-23 ENCOUNTER — Telehealth: Payer: Self-pay | Admitting: *Deleted

## 2015-10-23 VITALS — BP 119/78 | HR 83 | Temp 97.5°F | Ht 75.0 in | Wt 193.0 lb

## 2015-10-23 DIAGNOSIS — N179 Acute kidney failure, unspecified: Secondary | ICD-10-CM

## 2015-10-23 DIAGNOSIS — T826XXA Infection and inflammatory reaction due to cardiac valve prosthesis, initial encounter: Secondary | ICD-10-CM | POA: Diagnosis not present

## 2015-10-23 DIAGNOSIS — I38 Endocarditis, valve unspecified: Principal | ICD-10-CM

## 2015-10-23 NOTE — Progress Notes (Signed)
   Subjective:    Patient ID: Nicholas Walls, male    DOB: [birth year], 80 y.o.   MRN: [id number]  HPI 80 yo M with hx severe AS and pericardial tissue AVR 10-2011. He was doing well tiil Jan 2017 when he developed chills and abd pain. He was seen in [facility] ED and found to have a splenic infarct. He was eventually found to have 2.5 x 0.5 cm AV vegetation on TEE. He was found to have MRSE on his BCx from [facility].  He was given vanco/rifampin with plan for 6 weeks (March 8) and gent for 2 weeks (Feb 10).  Has been feeling well.  His labs at home on 2-13 showed Vanco Tr 16.5 and Cr of 1.71. Urine has been orange.  No problems with PIC line.  Has been on nystatin s/s due to thrush.   The past medical history, family history and social history were reviewed/updated in EPIC He does NOT drink 12 beers/week, currently abstinent.    Review of Systems  Constitutional: Negative for fever, chills and appetite change.  Respiratory: Negative for shortness of breath.   Cardiovascular: Negative for leg swelling.  Gastrointestinal: Negative for diarrhea and constipation.  Genitourinary: Negative for difficulty urinating.  has lost 6#, drinking boost now. No change in hearing.      Objective:   Physical Exam  Constitutional: He appears well-developed and well-nourished.  HENT:  Mouth/Throat: No oropharyngeal exudate.  Eyes: EOM are normal. Pupils are equal, round, and reactive to light.  Neck: Neck supple.  Cardiovascular: Normal rate and regular rhythm.   Murmur heard.  Crescendo systolic murmur is present with a grade of 2/6  Pulmonary/Chest: Effort normal and breath sounds normal.  Abdominal: Soft. Bowel sounds are normal. There is no tenderness. There is no rebound.  Musculoskeletal: He exhibits no edema.       Arms: Lymphadenopathy:    He has no cervical adenopathy.  Skin:  No  Embolic lesions on fingers      Assessment & Plan:

## 2015-10-23 NOTE — Telephone Encounter (Signed)
Verbal order to add LFTs to weekly lab draw.  Misty Stanley, RN verbalized understanding.  Verbal order from Dr. Ninetta Lights to "hold" vancomycin and repeat BMP, LFTs on Thurs., Feb. 23.  Stephanie verbalized understanding.  Verbal order to refer pt to [facility].

## 2015-10-23 NOTE — Assessment & Plan Note (Addendum)
Spoke with pt and family about his anbx We need his repeat Cr as well as LFTs since he is on rifampin.  I am concerned that his Cr has come up, this would no be unexpected since he was on vanco/gent. I am hopeful that this may improve off gent.  He will need repeat BCx 1-2 weeks after therapy.  I spoke to them that repeat echo may not show resolution of his valve lesion for some time.  rtc on 3-8   Add: His Cr yesterday was 2.39 and his vanco was 25.5.  His vanco is being held. Will have him seen by renal.  He needs repeat Cr on 2-23.

## 2015-10-24 ENCOUNTER — Other Ambulatory Visit: Payer: Self-pay | Admitting: *Deleted

## 2015-10-24 NOTE — Telephone Encounter (Signed)
Nicholas Walls with [facility] called looking for actual numbers on the order to change IV medication. Bun 26, creatinine 1.71 and vanc trough of 16.5 on 10/14/14. Patient lives in [place], [place] and the labs are drawn at an outside facility. [name]

## 2015-10-25 ENCOUNTER — Telehealth: Payer: Self-pay | Admitting: *Deleted

## 2015-10-25 DIAGNOSIS — I38 Endocarditis, valve unspecified: Secondary | ICD-10-CM | POA: Diagnosis not present

## 2015-10-25 DIAGNOSIS — Q6101 Congenital single renal cyst: Secondary | ICD-10-CM | POA: Diagnosis not present

## 2015-10-25 DIAGNOSIS — D649 Anemia, unspecified: Secondary | ICD-10-CM | POA: Diagnosis not present

## 2015-10-25 DIAGNOSIS — Z792 Long term (current) use of antibiotics: Secondary | ICD-10-CM | POA: Diagnosis not present

## 2015-10-25 DIAGNOSIS — T826XXA Infection and inflammatory reaction due to cardiac valve prosthesis, initial encounter: Secondary | ICD-10-CM | POA: Diagnosis not present

## 2015-10-25 DIAGNOSIS — Z452 Encounter for adjustment and management of vascular access device: Secondary | ICD-10-CM | POA: Diagnosis not present

## 2015-10-25 DIAGNOSIS — M1990 Unspecified osteoarthritis, unspecified site: Secondary | ICD-10-CM | POA: Diagnosis not present

## 2015-10-25 DIAGNOSIS — J449 Chronic obstructive pulmonary disease, unspecified: Secondary | ICD-10-CM | POA: Diagnosis not present

## 2015-10-25 DIAGNOSIS — B9562 Methicillin resistant Staphylococcus aureus infection as the cause of diseases classified elsewhere: Secondary | ICD-10-CM | POA: Diagnosis not present

## 2015-10-25 NOTE — Telephone Encounter (Signed)
Let Referral Coordinator know that referal is URGENT.  Their office will call when referral appt is made.

## 2015-10-29 DIAGNOSIS — T826XXA Infection and inflammatory reaction due to cardiac valve prosthesis, initial encounter: Secondary | ICD-10-CM | POA: Diagnosis not present

## 2015-10-29 DIAGNOSIS — J449 Chronic obstructive pulmonary disease, unspecified: Secondary | ICD-10-CM | POA: Diagnosis not present

## 2015-10-29 DIAGNOSIS — M1990 Unspecified osteoarthritis, unspecified site: Secondary | ICD-10-CM | POA: Diagnosis not present

## 2015-10-29 DIAGNOSIS — D649 Anemia, unspecified: Secondary | ICD-10-CM | POA: Diagnosis not present

## 2015-10-29 DIAGNOSIS — Z452 Encounter for adjustment and management of vascular access device: Secondary | ICD-10-CM | POA: Diagnosis not present

## 2015-10-29 DIAGNOSIS — B9562 Methicillin resistant Staphylococcus aureus infection as the cause of diseases classified elsewhere: Secondary | ICD-10-CM | POA: Diagnosis not present

## 2015-10-30 NOTE — Telephone Encounter (Addendum)
Patient's wife calling regarding referral status.  RN contacted [facility] - their computers are down this afternoon, they advised the patient to leave a message for the referral coordinator (who is out this afternoon, will be back tomorrow). RN relayed instructions to patient's wife, she will do so. Andree Coss, RN

## 2015-10-31 ENCOUNTER — Ambulatory Visit: Payer: Medicare Other | Admitting: Cardiology

## 2015-10-31 DIAGNOSIS — J449 Chronic obstructive pulmonary disease, unspecified: Secondary | ICD-10-CM | POA: Diagnosis not present

## 2015-10-31 DIAGNOSIS — M1990 Unspecified osteoarthritis, unspecified site: Secondary | ICD-10-CM | POA: Diagnosis not present

## 2015-10-31 DIAGNOSIS — Z792 Long term (current) use of antibiotics: Secondary | ICD-10-CM | POA: Diagnosis not present

## 2015-10-31 DIAGNOSIS — Z452 Encounter for adjustment and management of vascular access device: Secondary | ICD-10-CM | POA: Diagnosis not present

## 2015-10-31 DIAGNOSIS — I38 Endocarditis, valve unspecified: Secondary | ICD-10-CM | POA: Diagnosis not present

## 2015-10-31 DIAGNOSIS — T826XXA Infection and inflammatory reaction due to cardiac valve prosthesis, initial encounter: Secondary | ICD-10-CM | POA: Diagnosis not present

## 2015-10-31 DIAGNOSIS — N281 Cyst of kidney, acquired: Secondary | ICD-10-CM | POA: Diagnosis not present

## 2015-10-31 DIAGNOSIS — B9562 Methicillin resistant Staphylococcus aureus infection as the cause of diseases classified elsewhere: Secondary | ICD-10-CM | POA: Diagnosis not present

## 2015-10-31 DIAGNOSIS — D649 Anemia, unspecified: Secondary | ICD-10-CM | POA: Diagnosis not present

## 2015-11-01 ENCOUNTER — Other Ambulatory Visit: Payer: Self-pay

## 2015-11-01 ENCOUNTER — Ambulatory Visit (INDEPENDENT_AMBULATORY_CARE_PROVIDER_SITE_OTHER): Payer: Medicare Other | Admitting: Cardiology

## 2015-11-01 ENCOUNTER — Telehealth: Payer: Self-pay | Admitting: *Deleted

## 2015-11-01 ENCOUNTER — Encounter: Payer: Self-pay | Admitting: Cardiology

## 2015-11-01 VITALS — BP 124/70 | HR 80 | Ht 75.0 in | Wt 193.1 lb

## 2015-11-01 DIAGNOSIS — I330 Acute and subacute infective endocarditis: Secondary | ICD-10-CM | POA: Diagnosis not present

## 2015-11-01 DIAGNOSIS — Z953 Presence of xenogenic heart valve: Secondary | ICD-10-CM

## 2015-11-01 DIAGNOSIS — Z954 Presence of other heart-valve replacement: Secondary | ICD-10-CM | POA: Diagnosis not present

## 2015-11-01 MED ORDER — METOPROLOL TARTRATE 25 MG PO TABS: 12.5000 mg | ORAL_TABLET | Freq: Every day | ORAL | Status: DC

## 2015-11-01 NOTE — Progress Notes (Signed)
Cardiology Office Note   Date:  11/01/2015   ID:  Nicholas Walls, DOB 12-16-1935, MRN [id number]  PCP:  Nicholas Pont, DO  Cardiologist: Nicholas Walls  Chief Complaint  Patient presents with  . scheduled follow up    post hospital      History of Present Illness: Nicholas Walls is a 80 y.o. male who presents for scheduled follow-up visit He had a past history of severe aortic stenosis. He underwent aortic valve replacement with a pericardial tissue 10/13/11 by Nicholas Walls. He completed his outpatient cardiac rehabilitation program in which he did well.  The patient was doing well until early in January 2017.  He began to have some intermittent hard chills.  He was seen in our office on 09/20/15 at which time he clinically appeared to be stable.    The patient had an update of his echocardiogram on 09/12/15 which showed normal left ventricular systolic function with grade 2 diastolic dysfunction. There was a small gradient across the prosthetic aortic valve with a peak pressure of 26 and mean pressure of 14. He was admitted on 09/25/15 to [facility] and then transferred as an emergency to [facility] on 09/26/15 because of shortness of breath and intermittent chills.  He underwent a TEE which showed a vegetation. He was found to have 2.5 x 0.5 cm AV vegetation on TEE. He was found to have MRSE on his BCx from [facility] During the hospitalization he was seen by his cardiac surgeon Nicholas Walls.  He was also seen by the infectious disease team.  He was sent home on vancomycin for 6 weeks, rifampin for 6 weeks, and gentamicin for 2 weeks, ending February 10. Since discharge she has been doing well.  He had one episode of unexplained diaphoresis about a week ago which has not recurred.  Definite fever.  His appetite is fair.  His kidney function is being followed with regular blood draws at Nicholas Walls.  Results from recent labs are not presently  available. He has not been having any chest pain or shortness of breath or palpitations.  No dizziness or syncope. He was given vanco/rifampin with plan for 6 weeks (March 8) and gent for 2 weeks (Feb 10).   Past Medical History  Diagnosis Date  . Hyperlipidemia   . Aortic stenosis, severe     s/p AVR with pericardial tissue valve Feb 2013 per Nicholas Walls  . History of hiatal hernia   . History of anemia of chronic disease   . Hyponatremia     Mild hyponatremia, allowed to self-correct, asymptomatic  . Aortic valve defect   . Hypertension   . Bradycardia by electrocardiogram   . Full dentures   . Heart murmur   . OSA (obstructive sleep apnea)     "suppose to wear mask; can't" (09/28/2015)  . Osteoarthritis      End-stage osteoarthritis--left knee  . Arthritis     "all over my body"  . Anemia   . Renal infarct (HCC) ~ 09/24/2015  . 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 Total knee arthroplasty Bilateral   . Joint replacement    . Posterior fusion cervical spine    . Cardiac catheterization  2013  . Aortic valve replacement  10/13/2011    Procedure: AORTIC VALVE REPLACEMENT (AVR);  Surgeon: [name], Walls;  Location: [facility] OR;  Service: Open Heart  Surgery;  Laterality: N/A;  . Carpal tunnel release  06/01/2012    Procedure: CARPAL TUNNEL RELEASE;  Surgeon: Wyn Forster., Walls;  Location: [facility];  Service: Orthopedics;  Laterality: Right;  . Ulnar nerve transposition  06/01/2012    Procedure: ULNAR NERVE DECOMPRESSION/TRANSPOSITION;  Surgeon: Wyn Forster., Walls;  Location: [facility];  Service: Orthopedics;  Laterality: Right;  right ulnar nerve decompression  . Tee without cardioversion N/A 09/26/2015    Procedure: TRANSESOPHAGEAL ECHOCARDIOGRAM (TEE);  Surgeon: Chrystie Nose, Walls;  Location: [facility];  Service: Cardiovascular;  Laterality: N/A;  .  Cholecystectomy open    . Appendectomy    . Cardiac valve replacement       Current Outpatient Prescriptions  Medication Sig Dispense Refill  . acetaminophen (TYLENOL) 325 MG tablet Take 650 mg by mouth every 6 (six) hours as needed for moderate pain.    [name] aspirin 81 MG tablet Take 81 mg by mouth daily. Reported on 09/26/2015    . ferrous fumarate-iron polysaccharide complex (TANDEM) 162-115.2 MG CAPS capsule Take 1 capsule by mouth daily with breakfast.    . fish oil-omega-3 fatty acids 1000 MG capsule Take 3 g by mouth daily.     . metoprolol tartrate (LOPRESSOR) 25 MG tablet Take 12.5 mg by mouth daily.    [name] nystatin (MYCOSTATIN) 100000 UNIT/ML suspension Use as directed 5 mLs in the mouth or throat 2 (two) times daily as needed (oral thrush). Oral thrush  1  . rifampin (RIFADIN) 300 MG capsule Take 1 capsule (300 mg total) by mouth every 8 (eight) hours. 90 capsule 3  . VANCOMYCIN HCL IN NACL IV Inject 500 mg into the vein every other day. Inject  intravenous one time every 48 hours     No current facility-administered medications for this visit.    Allergies:   Ultram    Social History:  The patient  reports that he quit smoking about 34 years ago. His smoking use included Cigarettes. He has a 69 pack-year smoking history. He has never used smokeless tobacco. He reports that he does not drink alcohol or use illicit drugs.   Family History:  [name] history includes Heart failure in his father; Hypertension in his sister; Lung cancer in his father; Throat cancer in his mother.    ROS:  Please see the history of present illness.   Otherwise, review of systems are positive for none.   All other systems are reviewed and negative.    PHYSICAL EXAM: VS:  BP 124/70 mmHg  Pulse 80  Ht  (1.905 m)  Wt 193 lb 1.9 oz (87.599 kg)  BMI 24.14 kg/m2 , BMI Body mass index is 24.14 kg/(m^2). GEN: Well nourished, well developed, in no acute distress HEENT: normal Neck:  no JVD, carotid bruits, or masses Cardiac: Regular sinus rhythm.  There is a grade 2/6 systolic ejection murmur across the prosthetic aortic valve.  No diastolic murmur is heard.  No pericardial rub. No rubs, or gallops,no edema  Respiratory:  clear to auscultation bilaterally, normal work of breathing GI: soft, nontender, nondistended, + BS MS: no deformity or atrophy Skin: warm and dry, no rash Neuro:  Strength and sensation are intact Psych: euthymic mood, full affect   EKG:  EKG is ordered today. The ekg ordered today demonstrates normal sinus rhythm at 81 bpm.  No ischemic changes.  Since prior tracing of 09/20/15, no significant change   Recent Labs: 09/26/2015:  Magnesium 1.9; TSH 1.332 10/02/2015: ALT 34; BUN 12; Creatinine, Ser 0.99; Hemoglobin 10.3*; Platelets 250; Potassium 3.9; Sodium 134*    Lipid Panel    Component Value Date/Time   CHOL 134 09/20/2015 0907   TRIG 82 09/20/2015 0907   HDL 33* 09/20/2015 0907   CHOLHDL 4.1 09/20/2015 0907   VLDL 16 09/20/2015 0907   LDLCALC 85 09/20/2015 0907      Wt Readings from Last 3 Encounters:  11/01/15 193 lb 1.9 oz (87.599 kg)  10/23/15 193 lb (87.544 kg)  10/02/15 191 lb 1.6 oz (86.682 kg)         ASSESSMENT AND PLAN:  1.  Status post bioprosthetic aortic valve replacement for severe aortic stenosis in February 2013 by Nicholas Walls.  2.  Prosthetic aortic valve endocarditis with MRSA.  Currently being treated with vancomycin and rifampin.  His TEE on 09/26/15 showed:- Left ventricle: The cavity size was normal. There was mild concentric hypertrophy. Systolic function was normal. The estimated ejection fraction was in the range of 55% to 60%. Wall motion was normal; there were no regional wall motion abnormalities. - Aortic valve: Bioprosthetic aortic valve. There is a 2.5 cm x 0.5 cm filmantous, mobile vegetation attached to the valve and extending into the aortic root. There was trivial  regurgitation.  3.  Chronic kidney disease.  His serum creatinine on 10/22/15 drawn in [place] was 2.39.  He has an appointment for nephrology consultation pending with Dr. Hyman Hopes.  He had previously been on gentamicin which had been stopped.  4.  Past history of anemia.  Current medicines are reviewed at length with the patient today.  The patient does not have concerns regarding medicines.  The following changes have been made:  no change  Labs/ tests ordered today include:   Orders Placed This Encounter  Procedures  . EKG 12-Lead     Disposition:   Continue current medication.  At this point there does not appear to be any significant aortic insufficiency on physical examination.  He will be following up with infectious disease and with a new appointment with nephrology on March 8.  Following my retirement he will be followed up from a cardiology standpoint in one month by Dr. Duke Salvia.  Karie Schwalbe Walls 11/01/2015 3:20 PM    [facility] [address] [place], [place], [place]  [address] Phone: [phone]; Fax: [phone]

## 2015-11-01 NOTE — Telephone Encounter (Signed)
Lab results from 10/31/15 = WBCs dropped to 2.6 and Platelets dropped to 114, vanc trough 4.4, pt missed a dose.  Vanc dose changing to 1250 MG every 48 hours per Amy, [facility] Pharmacist.

## 2015-11-01 NOTE — Patient Instructions (Signed)
Medication Instructions:  STOP ALEVE   START TYLENOL AS NEEDED FOR HAND PAIN   Labwork: NONE  Testing/Procedures: NONE  Follow-Up: Your physician recommends that you schedule a follow-up appointment in: 1 MONTH OV WITH DR [facility] AT [facility]  If you need a refill on your cardiac medications before your next appointment, please call your pharmacy.

## 2015-11-02 ENCOUNTER — Other Ambulatory Visit: Payer: Self-pay | Admitting: Infectious Diseases

## 2015-11-02 NOTE — Telephone Encounter (Signed)
Spoke with advance Will stop rifampin.  His stop date is 3-8.  Will get more labs on 11-05-15

## 2015-11-04 DIAGNOSIS — T826XXA Infection and inflammatory reaction due to cardiac valve prosthesis, initial encounter: Secondary | ICD-10-CM | POA: Diagnosis not present

## 2015-11-04 DIAGNOSIS — Z792 Long term (current) use of antibiotics: Secondary | ICD-10-CM | POA: Diagnosis not present

## 2015-11-04 DIAGNOSIS — I38 Endocarditis, valve unspecified: Secondary | ICD-10-CM | POA: Diagnosis not present

## 2015-11-04 DIAGNOSIS — D649 Anemia, unspecified: Secondary | ICD-10-CM | POA: Diagnosis not present

## 2015-11-04 DIAGNOSIS — B9562 Methicillin resistant Staphylococcus aureus infection as the cause of diseases classified elsewhere: Secondary | ICD-10-CM | POA: Diagnosis not present

## 2015-11-04 DIAGNOSIS — J449 Chronic obstructive pulmonary disease, unspecified: Secondary | ICD-10-CM | POA: Diagnosis not present

## 2015-11-04 DIAGNOSIS — Z452 Encounter for adjustment and management of vascular access device: Secondary | ICD-10-CM | POA: Diagnosis not present

## 2015-11-04 DIAGNOSIS — N281 Cyst of kidney, acquired: Secondary | ICD-10-CM | POA: Diagnosis not present

## 2015-11-04 DIAGNOSIS — M1990 Unspecified osteoarthritis, unspecified site: Secondary | ICD-10-CM | POA: Diagnosis not present

## 2015-11-06 DIAGNOSIS — J449 Chronic obstructive pulmonary disease, unspecified: Secondary | ICD-10-CM | POA: Diagnosis not present

## 2015-11-06 DIAGNOSIS — M1990 Unspecified osteoarthritis, unspecified site: Secondary | ICD-10-CM | POA: Diagnosis not present

## 2015-11-06 DIAGNOSIS — T826XXA Infection and inflammatory reaction due to cardiac valve prosthesis, initial encounter: Secondary | ICD-10-CM | POA: Diagnosis not present

## 2015-11-06 DIAGNOSIS — B9562 Methicillin resistant Staphylococcus aureus infection as the cause of diseases classified elsewhere: Secondary | ICD-10-CM | POA: Diagnosis not present

## 2015-11-06 DIAGNOSIS — D649 Anemia, unspecified: Secondary | ICD-10-CM | POA: Diagnosis not present

## 2015-11-06 DIAGNOSIS — Z452 Encounter for adjustment and management of vascular access device: Secondary | ICD-10-CM | POA: Diagnosis not present

## 2015-11-07 ENCOUNTER — Ambulatory Visit (INDEPENDENT_AMBULATORY_CARE_PROVIDER_SITE_OTHER): Payer: Medicare Other | Admitting: Infectious Diseases

## 2015-11-07 ENCOUNTER — Encounter: Payer: Self-pay | Admitting: Infectious Diseases

## 2015-11-07 ENCOUNTER — Other Ambulatory Visit: Payer: Self-pay | Admitting: *Deleted

## 2015-11-07 VITALS — BP 152/79 | HR 66 | Temp 97.7°F | Ht 74.5 in | Wt 190.0 lb

## 2015-11-07 DIAGNOSIS — T826XXD Infection and inflammatory reaction due to cardiac valve prosthesis, subsequent encounter: Secondary | ICD-10-CM | POA: Diagnosis not present

## 2015-11-07 DIAGNOSIS — N179 Acute kidney failure, unspecified: Secondary | ICD-10-CM | POA: Diagnosis not present

## 2015-11-07 DIAGNOSIS — I38 Endocarditis, valve unspecified: Principal | ICD-10-CM

## 2015-11-07 NOTE — Progress Notes (Signed)
   Subjective:    Patient ID: Nicholas Walls, male    DOB: Jan 10, 1936, 80 y.o.   MRN: [id number]  HPI 80 yo M with hx severe AS and pericardial tissue AVR 10-2011. He was doing well tiil Jan 2017 when he developed chills and abd pain. He was seen in [place] ED and found to have a splenic infarct. He was eventually found to have 2.5 x 0.5 cm AV vegetation on TEE. He was found to have MRSE on his BCx from [facility].  He was given vanco/rifampin with plan for 6 weeks (March 8) and gent for 2 weeks (Feb 10).  His course was complicated by increasing Cr (down to 1.66 on 11-04-15) with sub-therapeutic vanco levels (last 6.6 on 11-04-15). His course was also complicated by dropping WBC (2.4 on 11-04-15) and dropping PLT. This improved somewhat (120 on 11-04-15) with stopping his rifampin. His anbx were stopped and his PIC was removed on 11-06-15.  Today he feels well. Denies f/c.  Was seen by Dr Patty Sermons last week and was told he was "doing great" Has appt with renal today.   Review of Systems  Constitutional: Negative for fever and chills.  Respiratory: Negative for shortness of breath.   Cardiovascular: Negative for chest pain and leg swelling.       Objective:   Physical Exam  Constitutional: He appears well-developed and well-nourished.  HENT:  Mouth/Throat:    Eyes: EOM are normal. Pupils are equal, round, and reactive to light.  Cardiovascular: Normal rate, regular rhythm and normal heart sounds.   Pulmonary/Chest: Effort normal and breath sounds normal.  Abdominal: Soft. Bowel sounds are normal. There is no tenderness.  Musculoskeletal:  Trace LE edema, non-pitting.       Assessment & Plan:

## 2015-11-07 NOTE — Assessment & Plan Note (Signed)
He is doing well I have some concern about low vanco levels.  He will repeat BCx x2 in 1 week at his PCP He can rtc as needed- I explained that I wanted to see him immediately if he has f/c, SOB, CP, LE edema or if his BCx are positive.

## 2015-11-13 ENCOUNTER — Encounter: Payer: Self-pay | Admitting: Infectious Diseases

## 2015-11-13 ENCOUNTER — Telehealth: Payer: Self-pay | Admitting: *Deleted

## 2015-11-13 NOTE — Telephone Encounter (Signed)
Patient is being discharged from [facility] nursing, as he no longer has a PICC or IV antibiotic needs.  Homehealth will send order to be signed. Andree Coss, RN

## 2015-11-15 ENCOUNTER — Encounter: Payer: Self-pay | Admitting: Internal Medicine

## 2015-11-15 DIAGNOSIS — R7881 Bacteremia: Secondary | ICD-10-CM | POA: Diagnosis not present

## 2015-11-15 DIAGNOSIS — J449 Chronic obstructive pulmonary disease, unspecified: Secondary | ICD-10-CM | POA: Diagnosis not present

## 2015-11-15 DIAGNOSIS — D649 Anemia, unspecified: Secondary | ICD-10-CM | POA: Diagnosis not present

## 2015-11-15 DIAGNOSIS — M1990 Unspecified osteoarthritis, unspecified site: Secondary | ICD-10-CM | POA: Diagnosis not present

## 2015-11-15 DIAGNOSIS — B9562 Methicillin resistant Staphylococcus aureus infection as the cause of diseases classified elsewhere: Secondary | ICD-10-CM | POA: Diagnosis not present

## 2015-11-15 DIAGNOSIS — T826XXA Infection and inflammatory reaction due to cardiac valve prosthesis, initial encounter: Secondary | ICD-10-CM | POA: Diagnosis not present

## 2015-11-15 DIAGNOSIS — Z452 Encounter for adjustment and management of vascular access device: Secondary | ICD-10-CM | POA: Diagnosis not present

## 2015-11-21 ENCOUNTER — Encounter: Payer: Self-pay | Admitting: Infectious Diseases

## 2015-11-27 DIAGNOSIS — H5213 Myopia, bilateral: Secondary | ICD-10-CM | POA: Diagnosis not present

## 2015-11-27 DIAGNOSIS — H04123 Dry eye syndrome of bilateral lacrimal glands: Secondary | ICD-10-CM | POA: Diagnosis not present

## 2015-11-27 DIAGNOSIS — H2513 Age-related nuclear cataract, bilateral: Secondary | ICD-10-CM | POA: Diagnosis not present

## 2015-11-27 DIAGNOSIS — H02831 Dermatochalasis of right upper eyelid: Secondary | ICD-10-CM | POA: Diagnosis not present

## 2015-11-27 DIAGNOSIS — H02834 Dermatochalasis of left upper eyelid: Secondary | ICD-10-CM | POA: Diagnosis not present

## 2015-11-29 ENCOUNTER — Telehealth: Payer: Self-pay | Admitting: *Deleted

## 2015-11-29 NOTE — Telephone Encounter (Signed)
Patient calling to see if blood cultures have been received from his PCP's office.   Labs in Dr. Moshe Cipro box.  Please call patient and advise. Andree Coss, RN

## 2015-12-03 ENCOUNTER — Telehealth: Payer: Self-pay | Admitting: Infectious Diseases

## 2015-12-03 ENCOUNTER — Ambulatory Visit: Payer: Medicare Other | Admitting: Cardiovascular Disease

## 2015-12-03 NOTE — Telephone Encounter (Signed)
Pt called, thanks 

## 2015-12-03 NOTE — Telephone Encounter (Signed)
Called pt and let him know his BCx were negative.  He will rountine f/u with his PCP.  Asked him to call if he has f/c, CP, SOB.

## 2015-12-06 ENCOUNTER — Ambulatory Visit (INDEPENDENT_AMBULATORY_CARE_PROVIDER_SITE_OTHER): Payer: Medicare Other | Admitting: Cardiovascular Disease

## 2015-12-06 ENCOUNTER — Encounter: Payer: Self-pay | Admitting: Cardiovascular Disease

## 2015-12-06 VITALS — BP 130/70 | HR 72 | Ht 75.0 in | Wt 190.0 lb

## 2015-12-06 DIAGNOSIS — I330 Acute and subacute infective endocarditis: Secondary | ICD-10-CM

## 2015-12-06 DIAGNOSIS — E7800 Pure hypercholesterolemia, unspecified: Secondary | ICD-10-CM | POA: Diagnosis not present

## 2015-12-06 DIAGNOSIS — Z954 Presence of other heart-valve replacement: Secondary | ICD-10-CM | POA: Diagnosis not present

## 2015-12-06 DIAGNOSIS — Z953 Presence of xenogenic heart valve: Secondary | ICD-10-CM

## 2015-12-06 NOTE — Patient Instructions (Addendum)
Medication Instructions:  Your physician recommends that you continue on your current medications as directed. Please refer to the Current Medication list given to you today.  Labwork: Lipid panel when you get your Echo. Nothing to eat or drink that morning except water and black coffee  Testing/Procedures: Your physician has requested that you have an echocardiogram. Echocardiography is a painless test that uses sound waves to create images of your heart. It provides your doctor with information about the size and shape of your heart and how well your heart's chambers and valves are working. This procedure takes approximately one hour. There are no restrictions for this procedure.  Follow-Up: Your physician wants you to follow-up in: 3 month ov You will receive a reminder letter in the mail two months in advance. If you don't receive a letter, please call our office to schedule the follow-up appointment.  If you need a refill on your cardiac medications before your next appointment, please call your pharmacy.

## 2015-12-06 NOTE — Progress Notes (Signed)
Cardiology Office Note   Date:  12/06/2015   ID:  Nicholas Walls, DOB 13-Apr-1936, MRN [id number]  PCP:  Lorelei Pont, DO  Cardiologist:   Madilyn Hook, MD  Infectious Disease: Johny Sax, MD  Chief Complaint  Patient presents with  . Follow-up     History of Present Illness: Nicholas Walls is a 80 y.o. male status post bioprosthetic aortic valve replacement (10/2011) with endocarditis treated medically, hyperlipidemia, OSA, who presents for follow-up.   Nicholas Walls developed a vegetation on his prosthetic aortic valve in January.  Blood cultures were positive for MRSE.  He was treated with 6 weeks of vancomycin, rifampin, and 2 weeks of gentamicin, ending on February 10.  Nicholas Walls is a former patient of Dr. Patty Sermons.  He was last seen in clinic on 11/01/15. At that time he was doing well.  Antibiotics were discontinued and his PICC was removed on 11/06/15. He followed up with his infectious disease doctor, Johny Sax, MD on 11/07/15.  He was recommended that he had repeat blood cultures in one week, which he reports were negative.  He states that he no longer needs to follow up with Dr. Ninetta Lights.   Nicholas Walls has been feeling well.  He denies chest pain, shortness of breath, lower extremity edema, orthopnea or PND. He has not noted any fever or chills. 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  He does not get any chest pain or shortness of breath with this activity.   Nicholas Walls daughter is a Engineer, civil (consulting) in our clinic.  She notes that he has mild memory deficits.  When he was hospitalized his statin was stopped due to concern that it may be contributing.    Past Medical History  Diagnosis Date  . Hyperlipidemia   . Aortic stenosis, severe     s/p AVR with pericardial tissue valve Feb 2013 per Dr. Tyrone Sage  . History of hiatal hernia   . History of anemia of chronic disease   . Hyponatremia     Mild hyponatremia, allowed to self-correct,  asymptomatic  . Aortic valve defect   . Hypertension   . Bradycardia by electrocardiogram   . Full dentures   . Heart murmur   . OSA (obstructive sleep apnea)     "suppose to wear mask; can't" (09/28/2015)  . Osteoarthritis      End-stage osteoarthritis--left knee  . Arthritis     "all over my body"  . Anemia   . Renal infarct (HCC) ~ 09/24/2015  . 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 Total knee arthroplasty Bilateral   . Joint replacement    . Posterior fusion cervical spine    . Cardiac catheterization  2013  . Aortic valve replacement  10/13/2011    Procedure: AORTIC VALVE REPLACEMENT (AVR);  Surgeon: [name], MD;  Location[facility] OR;  Service: Open Heart Surgery;  Laterality: N/A;  . Carpal tunnel release  06/01/2012    Procedure: CARPAL TUNNEL RELEASE;  Surgeon: Wyn Forster., MD;  Location: [facility];  Service: Orthopedics;  Laterality: Right;  . Ulnar nerve transposition  06/01/2012    Procedure: ULNAR NERVE DECOMPRESSION/TRANSPOSITION;  Surgeon: Wyn Forster., MD;  Location: [facility];  Service: Orthopedics;  Laterality: Right;  right ulnar nerve decompression  . Tee without cardioversion N/A 09/26/2015  Procedure: TRANSESOPHAGEAL ECHOCARDIOGRAM (TEE);  Surgeon: Chrystie Nose, MD;  [facility]: [facility] ENDOSCOPY;  Service: Cardiovascular;  Laterality: N/A;  . Cholecystectomy open    . Appendectomy    . Cardiac valve replacement       Current Outpatient Prescriptions  Medication Sig Dispense Refill  . acetaminophen (TYLENOL) 325 MG tablet Take 650 mg by mouth every 6 (six) hours as needed for moderate pain.    [name] aspirin 81 MG tablet Take 81 mg by mouth daily. Reported on 09/26/2015    . ferrous fumarate-iron polysaccharide complex (TANDEM) 162-115.2 MG CAPS capsule Take 1 capsule by mouth daily with breakfast.    . fish oil-omega-3 fatty acids 1000 MG  capsule Take 3 g by mouth daily.     . metoprolol tartrate (LOPRESSOR) 25 MG tablet Take 0.5 tablets (12.5 mg total) by mouth daily. 45 tablet 0   No current facility-administered medications for this visit.    Allergies:   Ultram    Social History:  The patient  reports that he quit smoking about 34 years ago. His smoking use included Cigarettes. He has a 69 pack-year smoking history. He has never used smokeless tobacco. He reports that he does not drink alcohol or use illicit drugs.   Family History:  [name] history includes Heart failure in his father; Hypertension in his sister; Lung cancer in his father; Throat cancer in his mother.    ROS:  Please see the history of present illness.   Otherwise, review of systems are positive for none.   All other systems are reviewed and negative.    PHYSICAL EXAM: VS:  BP 130/70 mmHg  Pulse 72  Ht 6\' 3"  (1.905 m)  Wt 86.183 kg (190 lb)  BMI 23.75 kg/m2 , BMI Body mass index is 23.75 kg/(m^2). GENERAL:  Well appearing HEENT:  Pupils equal round and reactive, fundi not visualized, oral mucosa unremarkable NECK:  No jugular venous distention, waveform within normal limits, carotid upstroke brisk and symmetric, no bruits, no thyromegaly LYMPHATICS:  No cervical adenopathy LUNGS:  Clear to auscultation bilaterally HEART:  RRR.  PMI not displaced or sustained,S1 and S2 within normal limits, no S3, no S4, no clicks, no rubs, II/VI systolic murmur at the LUSB.  ABD:  Flat, positive bowel sounds normal in frequency in pitch, no bruits, no rebound, no guarding, no midline pulsatile mass, no hepatomegaly, no splenomegaly EXT:  2 plus pulses throughout, no edema, no cyanosis no clubbing SKIN:  No rashes no nodules NEURO:  Cranial nerves II through XII grossly intact, motor grossly intact throughout PSYCH:  Cognitively intact, oriented to person place and time    EKG:  EKG is not ordered today.   Recent Labs: 09/26/2015: Magnesium  1.9; TSH 1.332 10/02/2015: ALT 34; BUN 12; Creatinine, Ser 0.99; Hemoglobin 10.3*; Platelets 250; Potassium 3.9; Sodium 134*    Lipid Panel    Component Value Date/Time   CHOL 134 09/20/2015 0907   TRIG 82 09/20/2015 0907   HDL 33* 09/20/2015 0907   CHOLHDL 4.1 09/20/2015 0907   VLDL 16 09/20/2015 0907   LDLCALC 85 09/20/2015 0907      Wt Readings from Last 3 Encounters:  12/06/15 86.183 kg (190 lb)  11/07/15 86.183 kg (190 lb)  11/01/15 87.599 kg (193 lb 1.9 oz)      ASSESSMENT AND PLAN:  # Bioprosthetic AVR # Endocarditis: Mr. Gladman is doing well and completed his course of IV antibiotics. He denies any fever or chills. Repeat  blood cultures have been negative. He has not had an echo since being discharged from the hospital.  We will obtain an echo to assess for recurrent vegetation. On exam his heart valve appears to be table and he is euvolemic.  # Hyperlipidemia: Mr. Hilaire statin was discontinued due to memory deficits.   We will repeat a fasting lipid panel today to see where his lipids are after making this change.    # OSA: Will need to discuss CPAP compliance at his follow up appointment.    Current medicines are reviewed at length with the patient today.  The patient does not have concerns regarding medicines.  The following changes have been made:  no change  Labs/ tests ordered today include:   Orders Placed This Encounter  Procedures  . Lipid panel  . ECHOCARDIOGRAM COMPLETE     Disposition:   FU with Sevanna Ballengee C. Duke Salvia, MD, [facility] in 3 months   This note was written with the assistance of speech recognition software.  Please excuse any transcriptional errors.  Signed, Dorsey Charette C. Duke Salvia, MD, [facility]  12/06/2015 5:43 PM    [facility]

## 2015-12-11 DIAGNOSIS — I38 Endocarditis, valve unspecified: Secondary | ICD-10-CM | POA: Diagnosis not present

## 2015-12-11 DIAGNOSIS — N280 Ischemia and infarction of kidney: Secondary | ICD-10-CM | POA: Diagnosis not present

## 2015-12-12 DIAGNOSIS — N179 Acute kidney failure, unspecified: Secondary | ICD-10-CM | POA: Diagnosis not present

## 2015-12-20 ENCOUNTER — Telehealth: Payer: Self-pay | Admitting: *Deleted

## 2015-12-20 ENCOUNTER — Other Ambulatory Visit: Payer: Self-pay | Admitting: *Deleted

## 2015-12-20 DIAGNOSIS — D508 Other iron deficiency anemias: Secondary | ICD-10-CM

## 2015-12-20 DIAGNOSIS — E7800 Pure hypercholesterolemia, unspecified: Secondary | ICD-10-CM

## 2015-12-20 DIAGNOSIS — Z953 Presence of xenogenic heart valve: Secondary | ICD-10-CM

## 2015-12-20 NOTE — Telephone Encounter (Signed)
Spoke with patient and his PCP was wanting CBC/CMET added to labs next week Orders placed in EPIC and will route to PCP

## 2015-12-25 ENCOUNTER — Other Ambulatory Visit: Payer: Self-pay

## 2015-12-25 ENCOUNTER — Ambulatory Visit (HOSPITAL_COMMUNITY): Payer: Medicare Other | Attending: Cardiology

## 2015-12-25 ENCOUNTER — Other Ambulatory Visit (INDEPENDENT_AMBULATORY_CARE_PROVIDER_SITE_OTHER): Payer: Medicare Other | Admitting: *Deleted

## 2015-12-25 DIAGNOSIS — G4733 Obstructive sleep apnea (adult) (pediatric): Secondary | ICD-10-CM | POA: Diagnosis not present

## 2015-12-25 DIAGNOSIS — Z954 Presence of other heart-valve replacement: Secondary | ICD-10-CM | POA: Diagnosis not present

## 2015-12-25 DIAGNOSIS — I340 Nonrheumatic mitral (valve) insufficiency: Secondary | ICD-10-CM | POA: Diagnosis not present

## 2015-12-25 DIAGNOSIS — Z953 Presence of xenogenic heart valve: Secondary | ICD-10-CM | POA: Insufficient documentation

## 2015-12-25 DIAGNOSIS — E7800 Pure hypercholesterolemia, unspecified: Secondary | ICD-10-CM

## 2015-12-25 DIAGNOSIS — E785 Hyperlipidemia, unspecified: Secondary | ICD-10-CM | POA: Diagnosis not present

## 2015-12-25 DIAGNOSIS — I359 Nonrheumatic aortic valve disorder, unspecified: Secondary | ICD-10-CM | POA: Diagnosis present

## 2015-12-25 DIAGNOSIS — I119 Hypertensive heart disease without heart failure: Secondary | ICD-10-CM | POA: Insufficient documentation

## 2015-12-25 DIAGNOSIS — I77810 Thoracic aortic ectasia: Secondary | ICD-10-CM | POA: Diagnosis not present

## 2015-12-25 DIAGNOSIS — I330 Acute and subacute infective endocarditis: Secondary | ICD-10-CM | POA: Insufficient documentation

## 2015-12-25 DIAGNOSIS — I10 Essential (primary) hypertension: Secondary | ICD-10-CM

## 2015-12-25 LAB — COMPREHENSIVE METABOLIC PANEL
ALK PHOS: 71 U/L (ref 40–115)
ALT: 22 U/L (ref 9–46)
AST: 20 U/L (ref 10–35)
Albumin: 3.9 g/dL (ref 3.6–5.1)
BUN: 15 mg/dL (ref 7–25)
CALCIUM: 9 mg/dL (ref 8.6–10.3)
CHLORIDE: 104 mmol/L (ref 98–110)
CO2: 28 mmol/L (ref 20–31)
Creat: 1.37 mg/dL — ABNORMAL HIGH (ref 0.70–1.18)
GLUCOSE: 92 mg/dL (ref 65–99)
POTASSIUM: 4.4 mmol/L (ref 3.5–5.3)
Sodium: 139 mmol/L (ref 135–146)
Total Bilirubin: 0.6 mg/dL (ref 0.2–1.2)
Total Protein: 7 g/dL (ref 6.1–8.1)

## 2015-12-25 LAB — CBC WITH DIFFERENTIAL/PLATELET
BASOS ABS: 32 {cells}/uL (ref 0–200)
Basophils Relative: 1 %
EOS ABS: 160 {cells}/uL (ref 15–500)
EOS PCT: 5 %
HCT: 34.3 % — ABNORMAL LOW (ref 38.5–50.0)
Hemoglobin: 11.8 g/dL — ABNORMAL LOW (ref 13.2–17.1)
LYMPHS PCT: 26 %
Lymphs Abs: 832 cells/uL — ABNORMAL LOW (ref 850–3900)
MCH: 30.5 pg (ref 27.0–33.0)
MCHC: 34.4 g/dL (ref 32.0–36.0)
MCV: 88.6 fL (ref 80.0–100.0)
MONOS PCT: 13 %
MPV: 9.4 fL (ref 7.5–12.5)
Monocytes Absolute: 416 cells/uL (ref 200–950)
NEUTROS ABS: 1760 {cells}/uL (ref 1500–7800)
Neutrophils Relative %: 55 %
PLATELETS: 143 10*3/uL (ref 140–400)
RBC: 3.87 MIL/uL — ABNORMAL LOW (ref 4.20–5.80)
RDW: 15.4 % — AB (ref 11.0–15.0)
WBC: 3.2 10*3/uL — ABNORMAL LOW (ref 3.8–10.8)

## 2015-12-25 LAB — LIPID PANEL
CHOL/HDL RATIO: 5.4 ratio — AB (ref <=5.0)
CHOLESTEROL: 183 mg/dL (ref 125–200)
HDL: 34 mg/dL — AB (ref >=40)
LDL CALC: 130 mg/dL — AB (ref <130)
TRIGLYCERIDES: 97 mg/dL (ref <150)
VLDL: 19 mg/dL (ref <30)

## 2015-12-25 NOTE — Addendum Note (Signed)
Addended by: Nilam Quakenbush K on: 12/25/2015 10:10 AM   Modules accepted: Orders  

## 2015-12-25 NOTE — Addendum Note (Signed)
Addended by: Tonita Phoenix on: 12/25/2015 10:10 AM   Modules accepted: Orders

## 2016-01-08 DIAGNOSIS — H04123 Dry eye syndrome of bilateral lacrimal glands: Secondary | ICD-10-CM | POA: Diagnosis not present

## 2016-01-08 DIAGNOSIS — H2513 Age-related nuclear cataract, bilateral: Secondary | ICD-10-CM | POA: Diagnosis not present

## 2016-01-08 DIAGNOSIS — H02831 Dermatochalasis of right upper eyelid: Secondary | ICD-10-CM | POA: Diagnosis not present

## 2016-01-08 DIAGNOSIS — H02834 Dermatochalasis of left upper eyelid: Secondary | ICD-10-CM | POA: Diagnosis not present

## 2016-01-09 ENCOUNTER — Telehealth: Payer: Self-pay | Admitting: *Deleted

## 2016-01-09 MED ORDER — EZETIMIBE 10 MG PO TABS: 10.0000 mg | ORAL_TABLET | Freq: Every day | ORAL | Status: DC

## 2016-01-09 NOTE — Telephone Encounter (Signed)
-----   Message from Chilton Si, MD sent at 12/27/2015  7:23 PM EDT ----- Cholesterol levels are elevated.  10 year risk of heart attack or stroke is 35%.  If statins are not an option, start zetia 10 mg daily.  Also, work on reducing fried and fatty foods.  Try to increase exercise to at least 30-40 minutes most days of the week.

## 2016-01-09 NOTE — Telephone Encounter (Signed)
Advised patient

## 2016-01-30 ENCOUNTER — Other Ambulatory Visit: Payer: Self-pay | Admitting: *Deleted

## 2016-01-30 MED ORDER — METOPROLOL TARTRATE 25 MG PO TABS: 12.5000 mg | ORAL_TABLET | Freq: Every day | ORAL | Status: DC

## 2016-03-27 ENCOUNTER — Encounter: Payer: Self-pay | Admitting: Cardiovascular Disease

## 2016-03-27 ENCOUNTER — Ambulatory Visit (INDEPENDENT_AMBULATORY_CARE_PROVIDER_SITE_OTHER): Payer: Medicare Other | Admitting: Cardiovascular Disease

## 2016-03-27 VITALS — BP 110/62 | HR 47 | Ht 75.0 in | Wt 191.4 lb

## 2016-03-27 DIAGNOSIS — R001 Bradycardia, unspecified: Secondary | ICD-10-CM

## 2016-03-27 DIAGNOSIS — E785 Hyperlipidemia, unspecified: Secondary | ICD-10-CM | POA: Diagnosis not present

## 2016-03-27 DIAGNOSIS — Z954 Presence of other heart-valve replacement: Secondary | ICD-10-CM

## 2016-03-27 DIAGNOSIS — Z953 Presence of xenogenic heart valve: Secondary | ICD-10-CM

## 2016-03-27 NOTE — Progress Notes (Signed)
Cardiology Office Note   Date:  03/27/2016   ID:  Nicholas Walls, DOB [birth year], MRN [id number]  PCP:  Lorelei Pont DO  Cardiologist:   Chilton Si, MD  Infectious Disease: Johny Sax, MD  Chief Complaint  Patient presents with  . Follow-up    3 months     History of Present Illness: Nicholas Walls is a 80 y.o. male status post bioprosthetic aortic valve replacement (10/2011) with endocarditis treated medically, hyperlipidemia, OSA, who presents for follow-up.   Nicholas Walls developed a vegetation on his prosthetic aortic valve 09/2015.  Blood cultures were positive for MRSE.  He was treated with 6 weeks of vancomycin, rifampin, and 2 weeks of gentamicin, ending on February 10.   Antibiotics were discontinued and his PICC was removed on 11/06/15.  He had a follow up echo 12/2015 that did not reveal any recurrent endocarditis.  Grade 1 diastolic dysfunction. After his last appointment his cholesterol levels were noted to be elevated. He was started on Zetia given that he had worsened memory deficits on statins.  Since his last appointment Nicholas Walls has been doing well.  He denies any chest pain, shortness of breath, or palpitations.  Occasionally he notes dizziness with positional changes but denies syncope or falls.  He has not noted any lower extremity edema, orthopnea, PND, fever or chills.  He has not been using his CPAP machine in years.  He struggled with several different masks and was never able to tolerate it.  He states that per his wife, he no longer snores like he used to.  He feels well-rested upon awakening and denies falling asleep easily throughout the day.  Past Medical History:  Diagnosis Date  . Anemia   . Aortic stenosis, severe    s/p AVR with pericardial tissue valve Feb 2013 per Dr. Tyrone Sage  . Aortic valve defect   . Arthritis    "all over my body"  . Bradycardia by electrocardiogram   . 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  Full dentures   . Heart murmur   . History of anemia of chronic disease   . History of hiatal hernia   . Hyperlipidemia   . Hypertension   . Hyponatremia    Mild hyponatremia, allowed to self-correct, asymptomatic  . OSA (obstructive sleep apnea)    "suppose to wear mask; can't" (09/28/2015)  . Osteoarthritis     End-stage osteoarthritis--left knee  . Renal infarct (HCC) ~ 09/24/2015    Past Surgical History:  Procedure Laterality Date  . AORTIC VALVE REPLACEMENT  10/13/2011   Procedure: AORTIC VALVE REPLACEMENT (AVR);  Surgeon: [name], MD;  Location: [facility] OR;  Service: Open Heart Surgery;  Laterality: N/A;  . APPENDECTOMY    . CARDIAC CATHETERIZATION  2013  . CARDIAC VALVE REPLACEMENT    . CARPAL TUNNEL RELEASE  06/01/2012   Procedure: CARPAL TUNNEL RELEASE;  Surgeon: Wyn Forster., MD;  Location: [facility];  Service: Orthopedics;  Laterality: Right;  . CHOLECYSTECTOMY OPEN    . JOINT REPLACEMENT    . POSTERIOR FUSION CERVICAL SPINE    . TEE WITHOUT CARDIOVERSION N/A 09/26/2015   Procedure: TRANSESOPHAGEAL ECHOCARDIOGRAM (TEE);  Surgeon: Chrystie Nose, MD;  Location: [facility] ENDOSCOPY;  Service: Cardiovascular;  Laterality: N/A;  . TOTAL KNEE ARTHROPLASTY Bilateral   . ULNAR NERVE TRANSPOSITION  06/01/2012   Procedure: ULNAR NERVE DECOMPRESSION/TRANSPOSITION;  Surgeon: Wyn Forster., MD;  Location: [facility];  Service: Orthopedics;  Laterality: Right;  right ulnar nerve decompression     Current Outpatient Prescriptions  Medication Sig Dispense Refill  . acetaminophen (TYLENOL) 325 MG tablet Take 650 mg by mouth every 6 (six) hours as needed for moderate pain.    [name] aspirin 81 MG tablet Take 81 mg by mouth daily. Reported on 09/26/2015    . ezetimibe (ZETIA) 10 MG tablet Take 1 tablet (10 mg total) by mouth daily. 30 tablet 5  . ferrous fumarate-iron polysaccharide complex (TANDEM) 162-115.2 MG CAPS capsule Take 1 capsule by mouth  daily with breakfast.    . fish oil-omega-3 fatty acids 1000 MG capsule Take 3 g by mouth daily.      No current facility-administered medications for this visit.     Allergies:   Ultram [tramadol hcl]    Social History:  The patient  reports that he quit smoking about 34 years ago. His smoking use included Cigarettes. He has a 69.00 pack-year smoking history. He has never used smokeless tobacco. He reports that he does not drink alcohol or use drugs.   Family History:  [name] history includes Heart failure in his father; Hypertension in his sister; Lung cancer in his father; Throat cancer in his mother.    ROS:  Please see the history of present illness.   Otherwise, review of systems are positive for none.   All other systems are reviewed and negative.    PHYSICAL EXAM: VS:  BP 110/62   Pulse (!) 47   Ht  (1.905 m)   Wt 191 lb 6.4 oz (86.8 kg)   BMI 23.92 kg/m  , BMI Body mass index is 23.92 kg/m. GENERAL:  Well appearing HEENT:  Pupils equal round and reactive, fundi not visualized, oral mucosa unremarkable NECK:  No jugular venous distention, waveform within normal limits, carotid upstroke brisk and symmetric, no bruits. LYMPHATICS:  No cervical adenopathy LUNGS:  Clear to auscultation bilaterally HEART:  Bradycardic.  Regular rhythm.  PMI not displaced or sustained,S within normal limits, prominent S2, no S3, no S4, no clicks, no rubs, II/VI systolic murmur at the LUSB.  ABD:  Flat, positive bowel sounds normal in frequency in pitch, no bruits, no rebound, no guarding, no midline pulsatile mass, no hepatomegaly, no splenomegaly EXT:  2 plus pulses throughout, no edema, no cyanosis no clubbing SKIN:  No rashes no nodules NEURO:  Cranial nerves II through XII grossly intact, motor grossly intact throughout PSYCH:  Cognitively intact, oriented to person place and time  EKG:  EKG is ordered today.   03/27/16: sinus bradycardia rate 47 bpm.  Echo  12/25/15: Study Conclusions  - Left ventricle: The cavity size was normal. Wall thickness was   increased in a pattern of mild LVH. Systolic function was normal.   The estimated ejection fraction was in the range of 55% to 60%.   Wall motion was normal; there were no regional wall motion   abnormalities. Doppler parameters are consistent with abnormal   left ventricular relaxation (grade 1 diastolic dysfunction). - Aortic valve: There was a bioprosthetic aortic valve. No residual   vegetation noted. Bioprosthetic valve appears to function   normally. There was no significant regurgitation. Mean gradient   (S): 15 mm Hg. - Aorta: Borderline dilated aortic root. Aortic root dimension: 37   mm (ED). - Mitral valve: Mildly calcified annulus. Mildly calcified leaflets   . There was mild regurgitation. - Left atrium: The atrium was  mildly dilated. - Right ventricle: The cavity size was normal. Systolic function   was normal. - Right atrium: The atrium was mildly dilated. - Tricuspid valve: Peak RV-RA gradient (S): 24 mm Hg. - Pulmonary arteries: PA peak pressure: 32 mm Hg (S). - Systemic veins: IVC measured 2.1 cm with > 50% respirophasic   variation, suggesting RA pressure 8 mmHg.  Impressions:  - Normal LV size with mild LV hypertrophy. EF 55-60%. Normal RV   size and systolic function. Bioprosthetic aortic valve appeared   to function normally. No evidence for residual endocarditis on   this study.  Recent Labs: 09/26/2015: Magnesium 1.9; TSH 1.332 12/25/2015: ALT 22; BUN 15; Creat 1.37; Hemoglobin 11.8; Platelets 143; Potassium 4.4; Sodium 139    Lipid Panel    Component Value Date/Time   CHOL 183 12/25/2015 1011   TRIG 97 12/25/2015 1011   HDL 34 (L) 12/25/2015 1011   CHOLHDL 5.4 (H) 12/25/2015 1011   VLDL 19 12/25/2015 1011   LDLCALC 130 (H) 12/25/2015 1011      Wt Readings from Last 3 Encounters:  03/27/16 191 lb 6.4 oz (86.8 kg)  12/06/15 190 lb (86.2 kg)   11/07/15 190 lb (86.2 kg)      ASSESSMENT AND PLAN:  # Bradycardia: Nicholas Walls was noted have sinus bradycardia to 47 bpm.  He is asymptomatic but has noted dizziness at times.  We will stop metoprolol and ask him to monitor his BP and heart rate over the next two weeks.  # Hyperlipidemia: Nicholas Walls statin was discontinued due to memory deficits.   He was started on Zetia due to hyperlipidemia.  We will repeat lipids and lfts today.  # OSA: Nicholas Walls is not interested in trying a CPAP again at this time.  # Bioprosthetic AVR # h/o Endocarditis: Nicholas Walls is doing well and has no evidence of recurrent antibiotics.  Repeat echo was unremarkable and he denies infectious symptoms.  He will need endocarditis prophylaxis for future dental procedures.   Current medicines are reviewed at length with the patient today.  The patient does not have concerns regarding medicines.  The following changes have been made:  no change  Labs/ tests ordered today include:   Orders Placed This Encounter  Procedures  . Lipid panel  . Comprehensive metabolic panel  . EKG 12-Lead     Disposition:   FU with Nicholas Bia C. Duke Salvia, MD, [facility] in 2 weeks.   This note was written with the assistance of speech recognition software.  Please excuse any transcriptional errors.  Signed, Pawel Soules C. Duke Salvia, MD, [facility]  03/27/2016 5:04 PM     [facility]

## 2016-03-27 NOTE — Patient Instructions (Addendum)
Medication Instructions: STOP METOPROLOL   Labwork: FASTING LP/CMET AT [facility]   Testing/Procedures: NONE  Follow-Up: Your physician recommends that you schedule a follow-up appointment in: 2-4 WEEKS   MONITOR YOUR BLOOD PRESSURE AND HEART RATE AT HOME. BRING WITH YOU TO YOUR FOLLOW UP OFFICE VISIT   If you need a refill on your cardiac medications before your next appointment, please call your pharmacy.

## 2016-04-02 ENCOUNTER — Encounter: Payer: Self-pay | Admitting: *Deleted

## 2016-04-02 DIAGNOSIS — I10 Essential (primary) hypertension: Secondary | ICD-10-CM | POA: Diagnosis not present

## 2016-04-02 DIAGNOSIS — N280 Ischemia and infarction of kidney: Secondary | ICD-10-CM | POA: Diagnosis not present

## 2016-04-02 DIAGNOSIS — E784 Other hyperlipidemia: Secondary | ICD-10-CM | POA: Diagnosis not present

## 2016-04-02 DIAGNOSIS — I38 Endocarditis, valve unspecified: Secondary | ICD-10-CM | POA: Diagnosis not present

## 2016-04-02 DIAGNOSIS — E785 Hyperlipidemia, unspecified: Secondary | ICD-10-CM

## 2016-04-02 DIAGNOSIS — Z79899 Other long term (current) drug therapy: Secondary | ICD-10-CM

## 2016-04-10 DIAGNOSIS — R590 Localized enlarged lymph nodes: Secondary | ICD-10-CM | POA: Diagnosis not present

## 2016-04-11 ENCOUNTER — Encounter: Payer: Self-pay | Admitting: Cardiovascular Disease

## 2016-04-11 ENCOUNTER — Ambulatory Visit (INDEPENDENT_AMBULATORY_CARE_PROVIDER_SITE_OTHER): Payer: Medicare Other | Admitting: Cardiovascular Disease

## 2016-04-11 VITALS — BP 130/66 | HR 60 | Ht 75.0 in | Wt 192.2 lb

## 2016-04-11 DIAGNOSIS — R001 Bradycardia, unspecified: Secondary | ICD-10-CM

## 2016-04-11 DIAGNOSIS — E785 Hyperlipidemia, unspecified: Secondary | ICD-10-CM | POA: Diagnosis not present

## 2016-04-11 NOTE — Patient Instructions (Signed)
Medication Instructions:  Your physician recommends that you continue on your current medications as directed. Please refer to the Current Medication list given to you today.  Labwork: none  Testing/Procedures: none  Follow-Up: Your physician recommends that you schedule a follow-up appointment in: 4 month ov   If you need a refill on your cardiac medications before your next appointment, please call your pharmacy.  

## 2016-04-11 NOTE — Progress Notes (Signed)
Cardiology Office Note   Date:  04/11/2016   ID:  Nicholas Walls, DOB 1935-09-08, MRN [id number]  PCP:  Nicholas Pont DO  Cardiologist:   Nicholas Si, MD  Infectious Disease: Nicholas Sax, MD  Chief Complaint  Patient presents with  . Follow-up     History of Present Illness: Nicholas Walls is a 80 y.o. male status post bioprosthetic aortic valve replacement (10/2011) with endocarditis treated medically, hyperlipidemia, OSA, who presents for follow-up.   Nicholas Walls developed a vegetation on his prosthetic aortic valve 09/2015.  Blood cultures were positive for MRSE.  He was treated with 6 weeks of vancomycin, rifampin, and 2 weeks of gentamicin, ending on February 10.   Antibiotics were discontinued and his PICC was removed on 11/06/15.  He had a follow up echo 12/2015 that did not reveal any recurrent endocarditis.  Grade 1 diastolic dysfunction. Nicholas Walls has been on Zetia given that he had worsened memory deficits on statins.  Nicholas Walls was seen 7/27 and his heart rate was 42.  He noted occasional dizziness with positional changes but denied syncope or falls.  Metoprolol was stopped and he kept a log of his heart rate and blood pressures that have ranged from 59-83 with blood pressures 110-120/60-80s.  The dizziness has improved and he continues to be active.  He works as a Conservator, museum/gallery and has no exertional chest pain or shortness of breath.  He has not noted any lower extremity edema, orthopnea or PND.  Past Medical History:  Diagnosis Date  . Anemia   . Aortic stenosis, severe    s/p AVR with pericardial tissue valve Feb 2013 per Dr. Tyrone Sage  . Aortic valve defect   . Arthritis    "all over my body"  . Bradycardia by electrocardiogram   . 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 Full dentures   . Heart murmur   . History of anemia of chronic disease   . History of hiatal hernia   . Hyperlipidemia   . Hypertension   . Hyponatremia    Mild  hyponatremia, allowed to self-correct, asymptomatic  . OSA (obstructive sleep apnea)    "suppose to wear mask; can't" (09/28/2015)  . Osteoarthritis     End-stage osteoarthritis--left knee  . Renal infarct (HCC) ~ 09/24/2015    Past Surgical History:  Procedure Laterality Date  . AORTIC VALVE REPLACEMENT  10/13/2011   Procedure: AORTIC VALVE REPLACEMENT (AVR);  Surgeon: [name], MD;  Location: [facility] OR;  Service: Open Heart Surgery;  Laterality: N/A;  . APPENDECTOMY    . CARDIAC CATHETERIZATION  2013  . CARDIAC VALVE REPLACEMENT    . CARPAL TUNNEL RELEASE  06/01/2012   Procedure: CARPAL TUNNEL RELEASE;  Surgeon: Wyn Forster., MD;  Location: [facility];  Service: Orthopedics;  Laterality: Right;  . CHOLECYSTECTOMY OPEN    . JOINT REPLACEMENT    . POSTERIOR FUSION CERVICAL SPINE    . TEE WITHOUT CARDIOVERSION N/A 09/26/2015   Procedure: TRANSESOPHAGEAL ECHOCARDIOGRAM (TEE);  Surgeon: Chrystie Nose, MD;  Location: [facility] ENDOSCOPY;  Service: Cardiovascular;  Laterality: N/A;  . TOTAL KNEE ARTHROPLASTY Bilateral   . ULNAR NERVE TRANSPOSITION  06/01/2012   Procedure: ULNAR NERVE DECOMPRESSION/TRANSPOSITION;  Surgeon: Wyn Forster., MD;  Location: [facility];  Service: Orthopedics;  Laterality: Right;  right ulnar nerve decompression     Current Outpatient Prescriptions  Medication Sig Dispense Refill  . acetaminophen (  TYLENOL) 325 MG tablet Take 650 mg by mouth every 6 (six) hours as needed for moderate pain.    [name] aspirin 81 MG tablet Take 81 mg by mouth daily. Reported on 09/26/2015    . ezetimibe (ZETIA) 10 MG tablet Take 1 tablet (10 mg total) by mouth daily. 30 tablet 5  . ferrous fumarate-iron polysaccharide complex (TANDEM) 162-115.2 MG CAPS capsule Take 1 capsule by mouth daily with breakfast.    . fish oil-omega-3 fatty acids 1000 MG capsule Take 3 g by mouth daily.      No current facility-administered medications for this visit.      Allergies:   Metoprolol and Ultram [tramadol hcl]    Social History:  The patient  reports that he quit smoking about 34 years ago. His smoking use included Cigarettes. He has a 69.00 pack-year smoking history. He has never used smokeless tobacco. He reports that he does not drink alcohol or use drugs.   Family History:  [name] history includes Heart failure in his father; Hypertension in his sister; Lung cancer in his father; Throat cancer in his mother.    ROS:  Please see the history of present illness.   Otherwise, review of systems are positive for none.   All other systems are reviewed and negative.    PHYSICAL EXAM: VS:  BP 130/66 (BP Location: Right Arm, Patient Position: Sitting, Cuff Size: Normal)   Pulse 60   Ht 6\' 3"  (1.905 m)   Wt 192 lb 3.2 oz (87.2 kg)   SpO2 99%   BMI 24.02 kg/m  , BMI Body mass index is 24.02 kg/m. GENERAL:  Well appearing HEENT:  Pupils equal round and reactive, fundi not visualized, oral mucosa unremarkable NECK:  No jugular venous distention, waveform within normal limits, carotid upstroke brisk and symmetric, no bruits. LYMPHATICS:  No cervical adenopathy LUNGS:  Clear to auscultation bilaterally HEART:  RRR.  PMI not displaced or sustained,S within normal limits, prominent S2, no S3, no S4, no clicks, no rubs, II/VI systolic murmur at the LUSB.  ABD:  Flat, positive bowel sounds normal in frequency in pitch, no bruits, no rebound, no guarding, no midline pulsatile mass, no hepatomegaly, no splenomegaly EXT:  2 plus pulses throughout, no edema, no cyanosis no clubbing SKIN:  No rashes no nodules NEURO:  Cranial nerves II through XII grossly intact, motor grossly intact throughout PSYCH:  Cognitively intact, oriented to person place and time  EKG:  EKG is ordered today.   03/27/16: sinus bradycardia rate 47 bpm. 04/11/16: Sinus rhythm rate 60 bpm.  Echo 12/25/15: Study Conclusions  - Left ventricle: The cavity size was  normal. Wall thickness was   increased in a pattern of mild LVH. Systolic function was normal.   The estimated ejection fraction was in the range of 55% to 60%.   Wall motion was normal; there were no regional wall motion   abnormalities. Doppler parameters are consistent with abnormal   left ventricular relaxation (grade 1 diastolic dysfunction). - Aortic valve: There was a bioprosthetic aortic valve. No residual   vegetation noted. Bioprosthetic valve appears to function   normally. There was no significant regurgitation. Mean gradient   (S): 15 mm Hg. - Aorta: Borderline dilated aortic root. Aortic root dimension: 37   mm (ED). - Mitral valve: Mildly calcified annulus. Mildly calcified leaflets   . There was mild regurgitation. - Left atrium: The atrium was mildly dilated. - Right ventricle: The cavity size was normal. Systolic  function   was normal. - Right atrium: The atrium was mildly dilated. - Tricuspid valve: Peak RV-RA gradient (S): 24 mm Hg. - Pulmonary arteries: PA peak pressure: 32 mm Hg (S). - Systemic veins: IVC measured 2.1 cm with > 50% respirophasic   variation, suggesting RA pressure 8 mmHg.  Impressions:  - Normal LV size with mild LV hypertrophy. EF 55-60%. Normal RV   size and systolic function. Bioprosthetic aortic valve appeared   to function normally. No evidence for residual endocarditis on   this study.  Recent Labs: 09/26/2015: Magnesium 1.9; TSH 1.332 12/25/2015: ALT 22; BUN 15; Creat 1.37; Hemoglobin 11.8; Platelets 143; Potassium 4.4; Sodium 139  04/03/16: WBC 2.8, hemoglobin 12.9, hematocrit 38.1, platelets 125  Lipid Panel    Component Value Date/Time   CHOL 183 12/25/2015 1011   TRIG 97 12/25/2015 1011   HDL 34 (L) 12/25/2015 1011   CHOLHDL 5.4 (H) 12/25/2015 1011   VLDL 19 12/25/2015 1011   LDLCALC 130 (H) 12/25/2015 1011      Wt Readings from Last 3 Encounters:  04/11/16 192 lb 3.2 oz (87.2 kg)  03/27/16 191 lb 6.4 oz (86.8 kg)   12/06/15 190 lb (86.2 kg)      ASSESSMENT AND PLAN:  # Bradycardia: Resolved after stopping metoprolol.  Symptoms have resolved.  He is not a good candidate for nodal agents in the future.  # Hyperlipidemia: Mr. Guia statin was discontinued due to memory deficits.   He was started on Zetia due to hyperlipidemia.    # OSA: Mr. Heo remains uninterested in trying a CPAP again at this time.  # Bioprosthetic AVR # h/o Endocarditis: Mr. Cudworth is doing well and has no evidence of recurrent antibiotics.  Repeat echo was unremarkable and he denies infectious symptoms.  He will need endocarditis prophylaxis for future dental procedures.   Current medicines are reviewed at length with the patient today.  The patient does not have concerns regarding medicines.  The following changes have been made:  no change  Labs/ tests ordered today include:   No orders of the defined types were placed in this encounter.    Disposition:   FU with Meribeth Vitug C. Duke Salvia, MD, [facility] in 4 months   This note was written with the assistance of speech recognition software.  Please excuse any transcriptional errors.  Signed, Danette Weinfeld C. Duke Salvia, MD, [facility]  04/11/2016 10:37 AM    [facility]

## 2016-04-14 DIAGNOSIS — Z0000 Encounter for general adult medical examination without abnormal findings: Secondary | ICD-10-CM | POA: Diagnosis not present

## 2016-04-16 ENCOUNTER — Encounter: Payer: Self-pay | Admitting: Cardiovascular Disease

## 2016-05-01 NOTE — Telephone Encounter (Signed)
This encounter was created in error - please disregard.

## 2016-06-11 DIAGNOSIS — Z87891 Personal history of nicotine dependence: Secondary | ICD-10-CM | POA: Diagnosis not present

## 2016-06-11 DIAGNOSIS — R590 Localized enlarged lymph nodes: Secondary | ICD-10-CM | POA: Diagnosis not present

## 2016-06-11 DIAGNOSIS — J439 Emphysema, unspecified: Secondary | ICD-10-CM | POA: Diagnosis not present

## 2016-06-24 ENCOUNTER — Other Ambulatory Visit: Payer: Self-pay | Admitting: Cardiovascular Disease

## 2016-06-24 NOTE — Telephone Encounter (Signed)
Review for refill. 

## 2016-06-26 DIAGNOSIS — J439 Emphysema, unspecified: Secondary | ICD-10-CM | POA: Diagnosis not present

## 2016-06-26 DIAGNOSIS — R590 Localized enlarged lymph nodes: Secondary | ICD-10-CM | POA: Diagnosis not present

## 2016-06-30 DIAGNOSIS — J439 Emphysema, unspecified: Secondary | ICD-10-CM | POA: Diagnosis not present

## 2016-06-30 DIAGNOSIS — R590 Localized enlarged lymph nodes: Secondary | ICD-10-CM | POA: Diagnosis not present

## 2016-07-15 DIAGNOSIS — Z23 Encounter for immunization: Secondary | ICD-10-CM | POA: Diagnosis not present

## 2016-07-16 DIAGNOSIS — R590 Localized enlarged lymph nodes: Secondary | ICD-10-CM | POA: Diagnosis not present

## 2016-07-16 DIAGNOSIS — Z87891 Personal history of nicotine dependence: Secondary | ICD-10-CM | POA: Diagnosis not present

## 2016-07-16 DIAGNOSIS — J439 Emphysema, unspecified: Secondary | ICD-10-CM | POA: Diagnosis not present

## 2016-08-11 ENCOUNTER — Ambulatory Visit (INDEPENDENT_AMBULATORY_CARE_PROVIDER_SITE_OTHER): Payer: Medicare Other | Admitting: Cardiovascular Disease

## 2016-08-11 ENCOUNTER — Encounter: Payer: Self-pay | Admitting: Cardiovascular Disease

## 2016-08-11 VITALS — BP 140/80 | HR 67 | Ht 75.0 in | Wt 188.4 lb

## 2016-08-11 DIAGNOSIS — Z953 Presence of xenogenic heart valve: Secondary | ICD-10-CM | POA: Diagnosis not present

## 2016-08-11 DIAGNOSIS — E7800 Pure hypercholesterolemia, unspecified: Secondary | ICD-10-CM | POA: Diagnosis not present

## 2016-08-11 DIAGNOSIS — I7121 Aneurysm of the ascending aorta, without rupture: Secondary | ICD-10-CM

## 2016-08-11 DIAGNOSIS — I712 Thoracic aortic aneurysm, without rupture: Secondary | ICD-10-CM

## 2016-08-11 DIAGNOSIS — R001 Bradycardia, unspecified: Secondary | ICD-10-CM | POA: Diagnosis not present

## 2016-08-11 HISTORY — DX: Aneurysm of the ascending aorta, without rupture: I71.21

## 2016-08-11 HISTORY — DX: Thoracic aortic aneurysm, without rupture: I71.2

## 2016-08-11 NOTE — Patient Instructions (Signed)
Medication Instructions:  Your physician recommends that you continue on your current medications as directed. Please refer to the Current Medication list given to you today.  Labwork: None   Testing/Procedures: Your physician has requested that you have an echocardiogram. Echocardiography is a painless test that uses sound waves to create images of your heart. It provides your doctor with information about the size and shape of your heart and how well your heart's chambers and valves are working. This procedure takes approximately one hour. There are no restrictions for this procedure. IN April AT THE [address] OFFICE  Follow-Up: Your physician wants you to follow-up in: April few days after Echo You will receive a reminder letter in the mail two months in advance. If you don't receive a letter, please call our office to schedule the follow-up appointment   If you need a refill on your cardiac medications before your next appointment, please call your pharmacy.

## 2016-08-11 NOTE — Progress Notes (Signed)
Cardiology Office Note   Date:  08/11/2016   ID:  Nicholas Walls, DOB 03-12-1936, MRN [id number]  PCP:  Lorelei Pont DO  Cardiologist:   Chilton Si, MD  Infectious Disease: Johny Sax, MD  Chief Complaint  Patient presents with  . Follow-up    Pt states no Sx.      History of Present Illness: Nicholas Walls is a 80 y.o. male status post bioprosthetic aortic valve replacement (10/2011) with endocarditis treated medically, hyperlipidemia, OSA, who presents for follow-up.   Nicholas Walls developed a vegetation on his prosthetic aortic valve 09/2015.  Blood cultures were positive for MRSE.  He was treated with 6 weeks of vancomycin, rifampin, and 2 weeks of gentamicin, ending on February 2017.   Antibiotics were discontinued and his PICC was removed on 11/06/15.  He had a follow up echo 12/2015 that did not reveal any recurrent endocarditis but did show grade 1 diastolic dysfunction. Nicholas Walls has been on Zetia given that he had worsened memory deficits on statins.  He notes that his insurance will no longer pay for it starting 09/2016.  Nicholas Walls was seen 03/2016 and his heart rate was 42.  He noted occasional dizziness with positional changes but denied syncope or falls.  Metoprolol was stopped and his bradycardia improved.  He has been feeling well and denies dizziness.  H also denies chest pain, shortness of breath, lower extremity edema, orthopnea or PND.  He denies fever or chills.  His wife fell last week and suffered several facial fractures.  He has been helping to care for her.  He remains very active and denies exertional symptoms.    Past Medical History:  Diagnosis Date  . Anemia   . Aortic stenosis, severe    s/p AVR with pericardial tissue valve Feb 2013 per Dr. Tyrone Sage  . Aortic valve defect   . Arthritis    "all over my body"  . Ascending aortic aneurysm (HCC) 08/11/2016   3.7cm on echo 12/2015  . Bradycardia by electrocardiogram   . Endocarditis of  prosthetic valve (HCC) 09-2015   MRSE  . Full dentures   . Heart murmur   . History of anemia of chronic disease   . History of hiatal hernia   . Hyperlipidemia   . Hypertension   . Hyponatremia    Mild hyponatremia, allowed to self-correct, asymptomatic  . OSA (obstructive sleep apnea)    "suppose to wear mask; can't" (09/28/2015)  . Osteoarthritis     End-stage osteoarthritis--left knee  . Renal infarct (HCC) ~ 09/24/2015    Past Surgical History:  Procedure Laterality Date  . AORTIC VALVE REPLACEMENT  10/13/2011   Procedure: AORTIC VALVE REPLACEMENT (AVR);  Surgeon: [name], MD;  Location: [facility] OR;  Service: Open Heart Surgery;  Laterality: N/A;  . APPENDECTOMY    . CARDIAC CATHETERIZATION  2013  . CARDIAC VALVE REPLACEMENT    . CARPAL TUNNEL RELEASE  06/01/2012   Procedure: CARPAL TUNNEL RELEASE;  Surgeon: Wyn Forster., MD;  Location: [facility];  Service: Orthopedics;  Laterality: Right;  . CHOLECYSTECTOMY OPEN    . JOINT REPLACEMENT    . POSTERIOR FUSION CERVICAL SPINE    . TEE WITHOUT CARDIOVERSION N/A 09/26/2015   Procedure: TRANSESOPHAGEAL ECHOCARDIOGRAM (TEE);  Surgeon: Chrystie Nose, MD;  Location: [facility] ENDOSCOPY;  Service: Cardiovascular;  Laterality: N/A;  . TOTAL KNEE ARTHROPLASTY Bilateral   . ULNAR NERVE TRANSPOSITION  06/01/2012   Procedure: ULNAR  NERVE DECOMPRESSION/TRANSPOSITION;  Surgeon: Wyn Forsterobert V Sypher Jr., MD;  Location: [facility];  Service: Orthopedics;  Laterality: Right;  right ulnar nerve decompression     Current Outpatient Prescriptions  Medication Sig Dispense Refill  . acetaminophen (TYLENOL) 325 MG tablet Take 650 mg by mouth every 6 (six) hours as needed for moderate pain.    [name]. aspirin 81 MG tablet Take 81 mg by mouth daily. Reported on 09/26/2015    . ferrous fumarate-iron polysaccharide complex (TANDEM) 162-115.2 MG CAPS capsule Take 1 capsule by mouth daily with breakfast.    . fish oil-omega-3 fatty  acids 1000 MG capsule Take 3 g by mouth daily.     [name]. ZETIA 10 MG tablet TAKE 1 TABLET(10 MG) BY MOUTH DAILY 30 tablet 4   No current facility-administered medications for this visit.     Allergies:   Metoprolol and Ultram [tramadol hcl]    Social History:  The patient  reports that he quit smoking about 34 years ago. His smoking use included Cigarettes. He has a 69.00 pack-year smoking history. He has never used smokeless tobacco. He reports that he does not drink alcohol or use drugs.   Family History:  [name] history includes Heart failure in his father; Hypertension in his sister; Lung cancer in his father; Throat cancer in his mother.    ROS:  Please see the history of present illness.   Otherwise, review of systems are positive for none.   All other systems are reviewed and negative.    PHYSICAL EXAM: VS:  BP 140/80   Pulse 67   Ht 6\' 3"  (1.905 m)   Wt 85.5 kg (188 lb 6.4 oz)   BMI 23.55 kg/m  , BMI Body mass index is 23.55 kg/m. GENERAL:  Well appearing HEENT:  Pupils equal round and reactive, fundi not visualized, oral mucosa unremarkable NECK:  No jugular venous distention, waveform within normal limits, carotid upstroke brisk and symmetric, no bruits. LYMPHATICS:  No cervical adenopathy LUNGS:  Clear to auscultation bilaterally HEART:  RRR.  PMI not displaced or sustained,S within normal limits, prominent S2, no S3, no S4, no clicks, no rubs, II/VI systolic murmur at the LUSB. I/VI holosystolic murmur at the apex.  ABD:  Flat, positive bowel sounds normal in frequency in pitch, no bruits, no rebound, no guarding, no midline pulsatile mass, no hepatomegaly, no splenomegaly EXT:  2 plus pulses throughout, no edema, no cyanosis no clubbing SKIN:  No rashes no nodules NEURO:  Cranial nerves II through XII grossly intact, motor grossly intact throughout PSYCH:  Cognitively intact, oriented to person place and time  EKG:  EKG is ordered today.   03/27/16: sinus  bradycardia rate 47 bpm. 04/11/16: Sinus rhythm rate 60 bpm.  Echo 12/25/15: Study Conclusions  - Left ventricle: The cavity size was normal. Wall thickness was   increased in a pattern of mild LVH. Systolic function was normal.   The estimated ejection fraction was in the range of 55% to 60%.   Wall motion was normal; there were no regional wall motion   abnormalities. Doppler parameters are consistent with abnormal   left ventricular relaxation (grade 1 diastolic dysfunction). - Aortic valve: There was a bioprosthetic aortic valve. No residual   vegetation noted. Bioprosthetic valve appears to function   normally. There was no significant regurgitation. Mean gradient   (S): 15 mm Hg. - Aorta: Borderline dilated aortic root. Aortic root dimension: 37   mm (ED). - Mitral valve:  Mildly calcified annulus. Mildly calcified leaflets   . There was mild regurgitation. - Left atrium: The atrium was mildly dilated. - Right ventricle: The cavity size was normal. Systolic function   was normal. - Right atrium: The atrium was mildly dilated. - Tricuspid valve: Peak RV-RA gradient (S): 24 mm Hg. - Pulmonary arteries: PA peak pressure: 32 mm Hg (S). - Systemic veins: IVC measured 2.1 cm with > 50% respirophasic   variation, suggesting RA pressure 8 mmHg.  Impressions:  - Normal LV size with mild LV hypertrophy. EF 55-60%. Normal RV   size and systolic function. Bioprosthetic aortic valve appeared   to function normally. No evidence for residual endocarditis on   this study.  Recent Labs: 09/26/2015: Magnesium 1.9; TSH 1.332 12/25/2015: ALT 22; BUN 15; Creat 1.37; Hemoglobin 11.8; Platelets 143; Potassium 4.4; Sodium 139  04/03/16: WBC 2.8, hemoglobin 12.9, hematocrit 38.1, platelets 125 08/11/16: Total cholesterol 145, triglycerides 107, HDL 35, LDL 89  Lipid Panel    Component Value Date/Time   CHOL 183 12/25/2015 1011   TRIG 97 12/25/2015 1011   HDL 34 (L) 12/25/2015 1011   CHOLHDL  5.4 (H) 12/25/2015 1011   VLDL 19 12/25/2015 1011   LDLCALC 130 (H) 12/25/2015 1011      Wt Readings from Last 3 Encounters:  08/11/16 85.5 kg (188 lb 6.4 oz)  04/11/16 87.2 kg (192 lb 3.2 oz)  03/27/16 86.8 kg (191 lb 6.4 oz)      ASSESSMENT AND PLAN:  # Bradycardia: Resolved after stopping metoprolol.  Symptoms have resolved.  He is not a good candidate for nodal agents in the future.  # Hyperlipidemia: Mr. Nohr statin was discontinued due to memory deficits.   He was started on Zetia due to hyperlipidemia.  He notes that his insurance won't cover Zetia in 2018.  We will check his CMP and lipids 3 months after stopping.    # Bioprosthetic AVR # h/o Endocarditis: Mr. Kloepfer is doing well and has no evidence of recurrent antibiotics.  Repeat echo was unremarkable and he denies infectious symptoms.  He will need endocarditis prophylaxis for future dental procedures.  # Aortic aneurysm: Ascending aorta was 3.7 cm 12/2015.  We will repeat his echo in 11/2016.   Current medicines are reviewed at length with the patient today.  The patient does not have concerns regarding medicines.  The following changes have been made:  no change  Labs/ tests ordered today include:   No orders of the defined types were placed in this encounter.    Disposition:   FU with Henrick Mcgue C. Duke Salvia, MD, [facility] in 4 months   This note was written with the assistance of speech recognition software.  Please excuse any transcriptional errors.  Signed, Brendan Gadson C. Duke Salvia, MD, [facility]  08/11/2016 11:37 PM    [facility]

## 2016-09-19 ENCOUNTER — Telehealth: Payer: Self-pay | Admitting: *Deleted

## 2016-09-19 MED ORDER — EZETIMIBE 10 MG PO TABS: ORAL_TABLET | ORAL | 3 refills | Status: DC

## 2016-09-19 NOTE — Telephone Encounter (Signed)
Spoke with patients wife, requesting Zetia be sent to mail order pharmacy  Rx sent phone # [phone]

## 2016-12-05 ENCOUNTER — Other Ambulatory Visit: Payer: Self-pay

## 2016-12-05 ENCOUNTER — Ambulatory Visit (HOSPITAL_COMMUNITY): Payer: Medicare Other | Attending: Internal Medicine

## 2016-12-05 DIAGNOSIS — I712 Thoracic aortic aneurysm, without rupture: Secondary | ICD-10-CM

## 2016-12-05 DIAGNOSIS — I501 Left ventricular failure: Secondary | ICD-10-CM | POA: Diagnosis not present

## 2016-12-05 DIAGNOSIS — I361 Nonrheumatic tricuspid (valve) insufficiency: Secondary | ICD-10-CM | POA: Diagnosis not present

## 2016-12-05 DIAGNOSIS — I349 Nonrheumatic mitral valve disorder, unspecified: Secondary | ICD-10-CM | POA: Diagnosis not present

## 2016-12-05 DIAGNOSIS — Z953 Presence of xenogenic heart valve: Secondary | ICD-10-CM | POA: Insufficient documentation

## 2016-12-05 DIAGNOSIS — I420 Dilated cardiomyopathy: Secondary | ICD-10-CM | POA: Diagnosis not present

## 2016-12-05 DIAGNOSIS — I7121 Aneurysm of the ascending aorta, without rupture: Secondary | ICD-10-CM

## 2016-12-23 IMAGING — US US ABDOMEN LIMITED
1 series · 14 of 25 positions shown · non-contrast
Comparison: None.

CLINICAL DATA: Patient with elevated bilirubin. Prior
cholecystectomy.

EXAM:
US ABDOMEN LIMITED - RIGHT UPPER QUADRANT

[Series 1: us abdomen limited · 0.24mm/px · 14 of 32 slices shown]
[im 1/32]
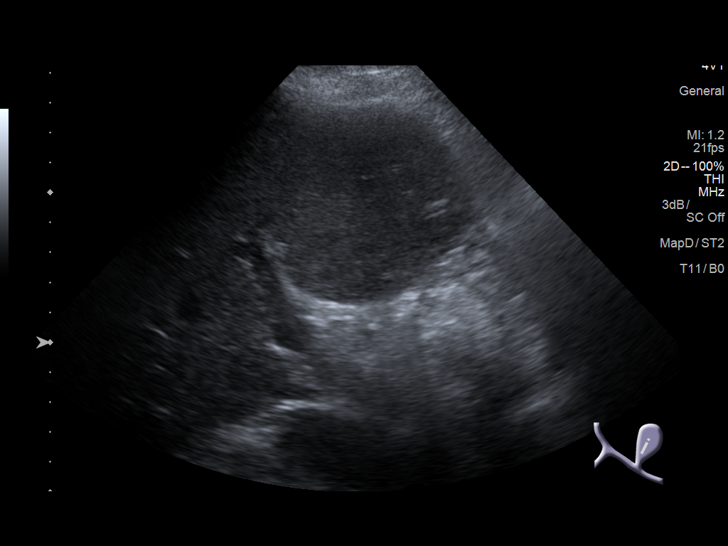
[im 3/32]
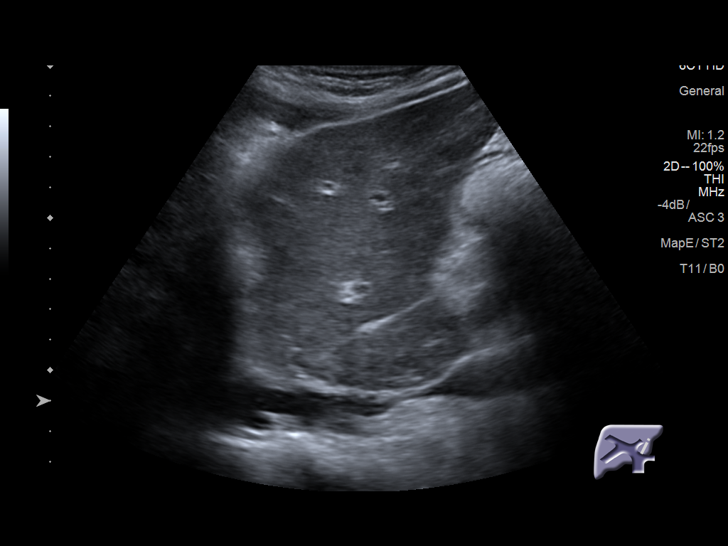
[im 6/32]
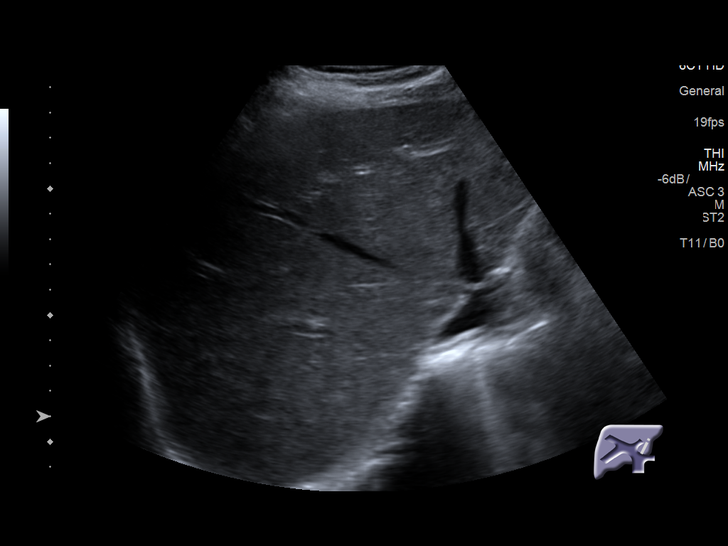
[im 8/32]
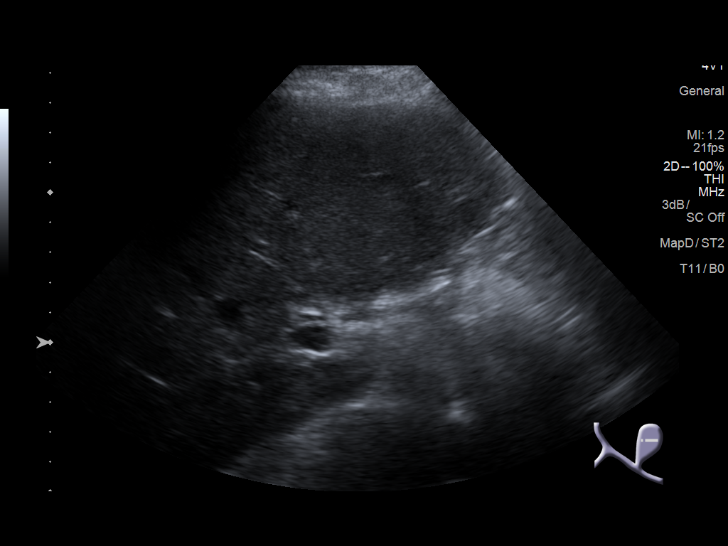
[im 11/32]
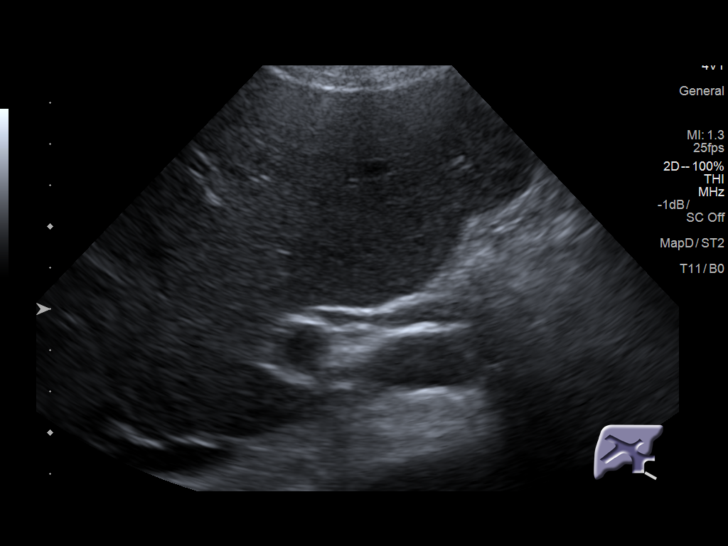
[im 12/32]
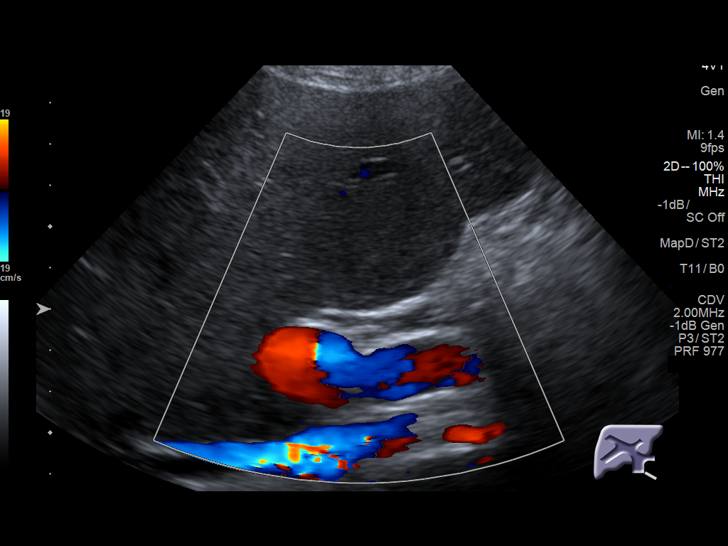
[im 15/32]
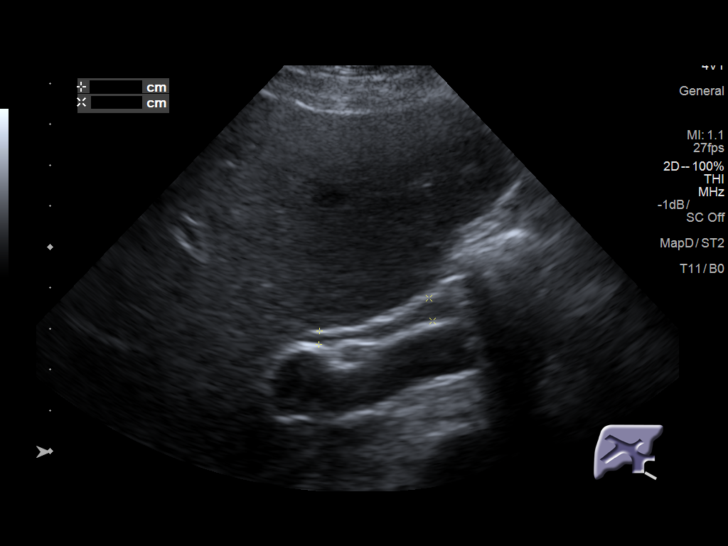
[im 17/32]
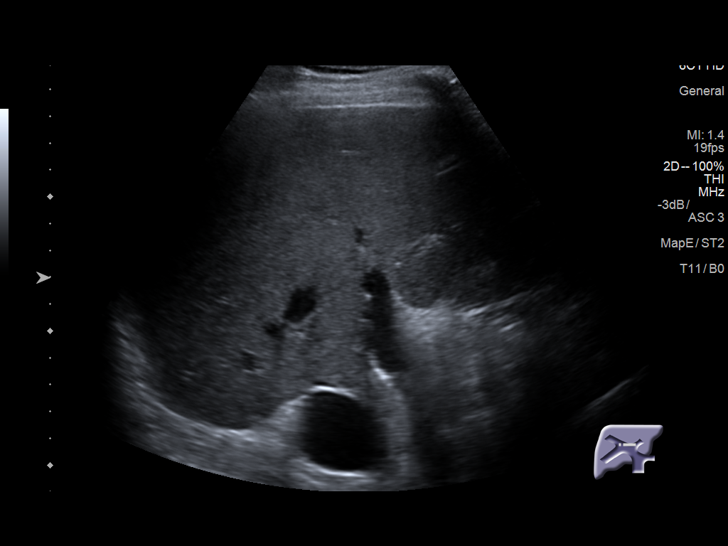
[im 20/32]
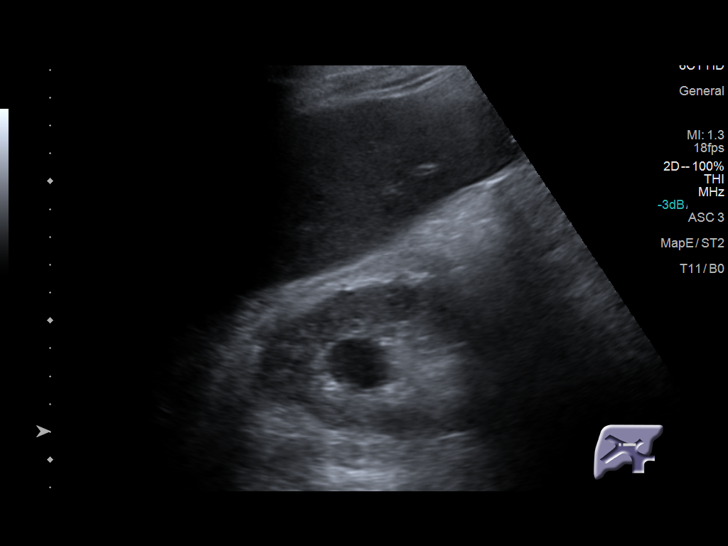
[im 21/32]
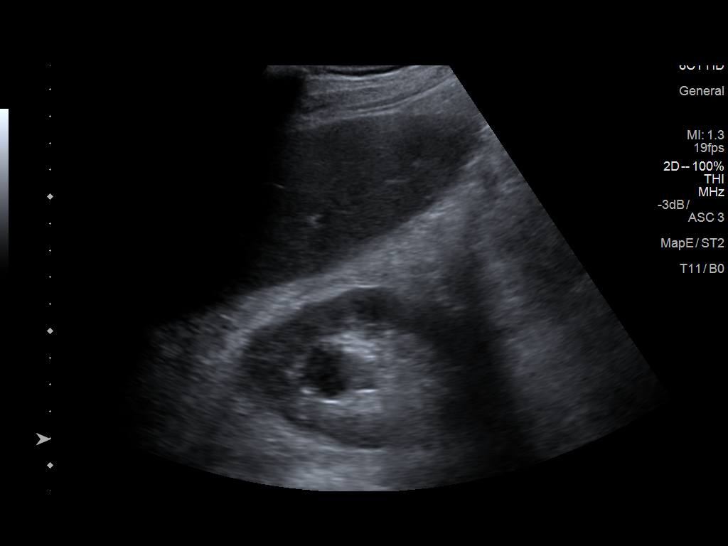
[im 24/32]
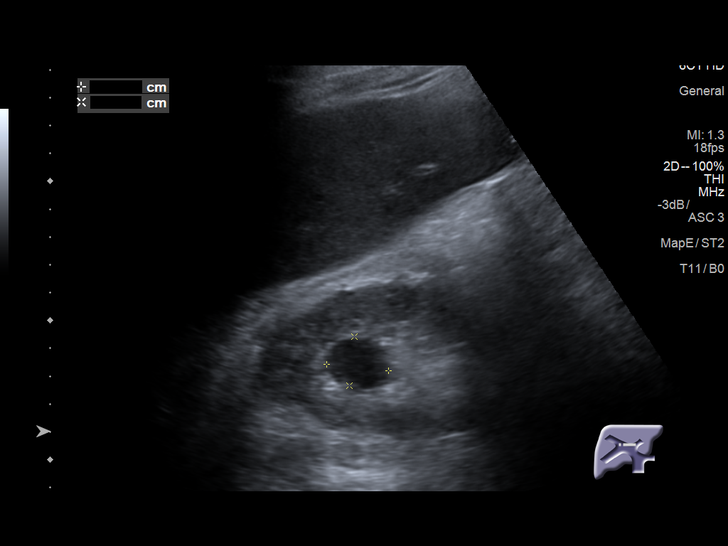
[im 26/32]
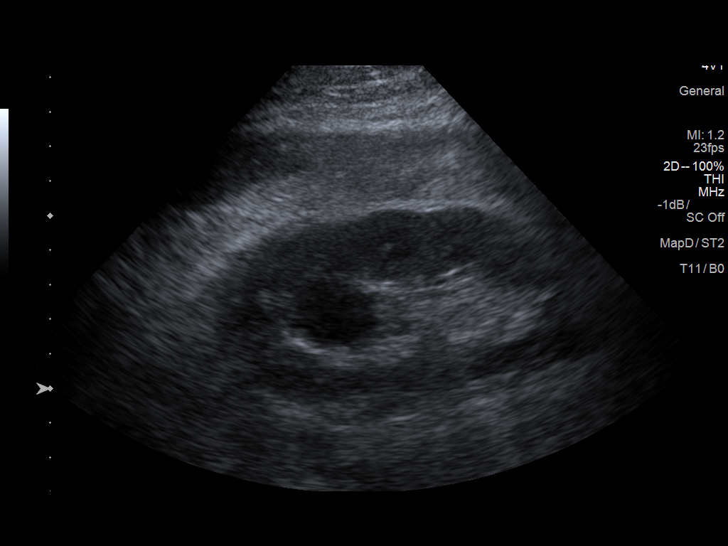
[im 29/32]
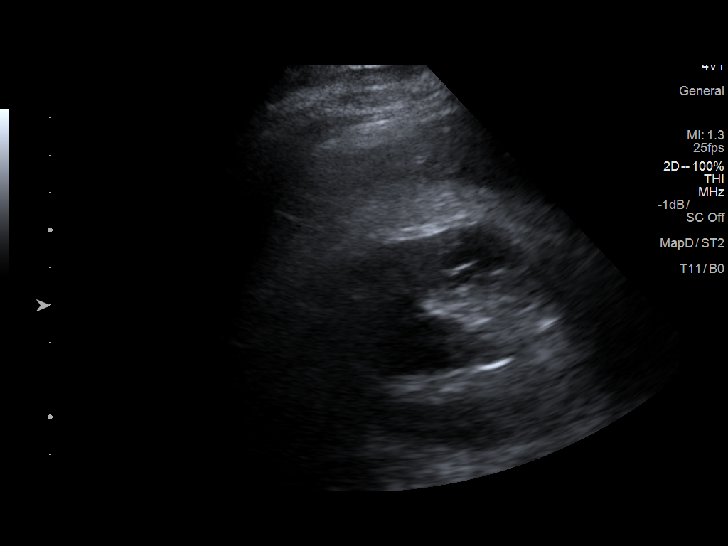
[im 32/32]
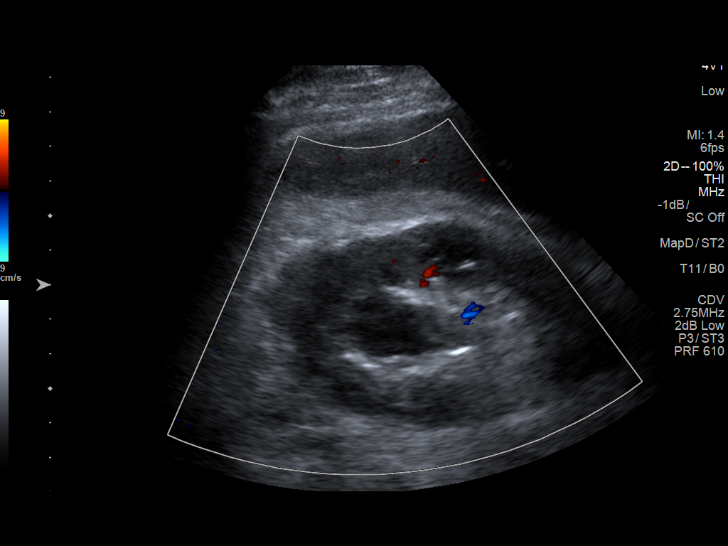

[14 of 25 positions shown; findings below may reference images not displayed]

FINDINGS: Gallbladder:

Patient status post cholecystectomy.

Common bile duct:

Diameter: 3 mm

Liver:

No focal lesion identified. Within normal limits in parenchymal
echogenicity.

Incidentally identified are 2 probable cyst within the right kidney.
IMPRESSION: Status post cholecystectomy. No intrahepatic or extrahepatic biliary
ductal dilatation.

## 2017-03-02 ENCOUNTER — Ambulatory Visit (INDEPENDENT_AMBULATORY_CARE_PROVIDER_SITE_OTHER): Payer: Medicare Other | Admitting: Cardiovascular Disease

## 2017-03-02 ENCOUNTER — Encounter: Payer: Self-pay | Admitting: Cardiovascular Disease

## 2017-03-02 VITALS — BP 122/56 | HR 54 | Ht 75.0 in | Wt 188.2 lb

## 2017-03-02 DIAGNOSIS — E7800 Pure hypercholesterolemia, unspecified: Secondary | ICD-10-CM

## 2017-03-02 DIAGNOSIS — I712 Thoracic aortic aneurysm, without rupture: Secondary | ICD-10-CM

## 2017-03-02 DIAGNOSIS — Z953 Presence of xenogenic heart valve: Secondary | ICD-10-CM

## 2017-03-02 DIAGNOSIS — I7121 Aneurysm of the ascending aorta, without rupture: Secondary | ICD-10-CM

## 2017-03-02 NOTE — Addendum Note (Signed)
Addended by: Regis Bill B on: 03/02/2017 05:11 PM   Modules accepted: Orders

## 2017-03-02 NOTE — Patient Instructions (Signed)
Medication Instructions:  Your physician recommends that you continue on your current medications as directed. Please refer to the Current Medication list given to you today.  Labwork: FASTING LP/CMET AT PRIMARY CARE SOON  Testing/Procedures: Your physician has requested that you have an echocardiogram. Echocardiography is a painless test that uses sound waves to create images of your heart. It provides your doctor with information about the size and shape of your heart and how well your heart's chambers and valves are working. This procedure takes approximately one hour. There are no restrictions for this procedure. April 2019  Follow-Up: Your physician wants you to follow-up in: 6 MONTH OV  You will receive a reminder letter in the mail two months in advance. If you don't receive a letter, please call our office to schedule the follow-up appointment.  If you need a refill on your cardiac medications before your next appointment, please call your pharmacy.

## 2017-03-02 NOTE — Progress Notes (Signed)
Cardiology Office Note   Date:  03/02/2017   ID:  Nicholas Walls, DOB [birth year], MRN [id number]  PCP:  Lorelei Pont, DO  Cardiologist:   Chilton Si, MD  Infectious Disease: Johny Sax, MD  No chief complaint on file.    History of Present Illness: Nicholas Walls is a 81 y.o. male status post bioprosthetic aortic valve replacement (10/2011) with endocarditis treated medically, mild ascending aorta aneurysm, hyperlipidemia, and OSA who presents for follow-up.   Nicholas Walls developed a vegetation on his prosthetic aortic valve 09/2015.  Blood cultures were positive for MRSA.  He was treated with 6 weeks of vancomycin, rifampin, and 2 weeks of gentamicin, ending on February 2017.   Antibiotics were discontinued and his PICC was removed on 11/06/15.  He had a follow up echo 12/2015 that did not reveal any recurrent endocarditis but did show grade 1 diastolic dysfunction. Nicholas Walls has been on Zetia given that he had worsened memory deficits on statins.   Nicholas Walls has been doing well.  He denies fever, chills, chest pain or shortness of breath.  He hasn't noted any lower extremity edema, orthopnea or PND.  Since stopping metoprolol he hasn't experienced any dizziness.  He has been busy caring for his wife who has RA and hasn't been working as much anymore.  He remains uninterested in using a CPAP machine.    Past Medical History:  Diagnosis Date  . Anemia   . Aortic stenosis, severe    s/p AVR with pericardial tissue valve Feb 2013 per Dr. Tyrone Sage  . Aortic valve defect   . Arthritis    "all over my body"  . Ascending aortic aneurysm (HCC) 08/11/2016   3.7cm on echo 12/2015  . Bradycardia by electrocardiogram   . 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 Full dentures   . Heart murmur   . History of anemia of chronic disease   . History of hiatal hernia   . Hyperlipidemia   . Hypertension   . Hyponatremia    Mild hyponatremia, allowed to  self-correct, asymptomatic  . OSA (obstructive sleep apnea)    "suppose to wear mask; can't" (09/28/2015)  . Osteoarthritis     End-stage osteoarthritis--left knee  . Renal infarct (HCC) ~ 09/24/2015    Past Surgical History:  Procedure Laterality Date  . AORTIC VALVE REPLACEMENT  10/13/2011   Procedure: AORTIC VALVE REPLACEMENT (AVR);  Surgeon: [name], MD;  Location: [facility] OR;  Service: Open Heart Surgery;  Laterality: N/A;  . APPENDECTOMY    . CARDIAC CATHETERIZATION  2013  . CARDIAC VALVE REPLACEMENT    . CARPAL TUNNEL RELEASE  06/01/2012   Procedure: CARPAL TUNNEL RELEASE;  Surgeon: Wyn Forster., MD;  Location: [facility];  Service: Orthopedics;  Laterality: Right;  . CHOLECYSTECTOMY OPEN    . JOINT REPLACEMENT    . POSTERIOR FUSION CERVICAL SPINE    . TEE WITHOUT CARDIOVERSION N/A 09/26/2015   Procedure: TRANSESOPHAGEAL ECHOCARDIOGRAM (TEE);  Surgeon: Chrystie Nose, MD;  Location: [facility] ENDOSCOPY;  Service: Cardiovascular;  Laterality: N/A;  . TOTAL KNEE ARTHROPLASTY Bilateral   . ULNAR NERVE TRANSPOSITION  06/01/2012   Procedure: ULNAR NERVE DECOMPRESSION/TRANSPOSITION;  Surgeon: Wyn Forster., MD;  Location: [facility];  Service: Orthopedics;  Laterality: Right;  right ulnar nerve decompression     Current Outpatient Prescriptions  Medication Sig Dispense Refill  . acetaminophen (TYLENOL) 325 MG tablet Take  650 mg by mouth every 6 (six) hours as needed for moderate pain.    [name] aspirin 81 MG tablet Take 81 mg by mouth daily. Reported on 09/26/2015    . ezetimibe (ZETIA) 10 MG tablet TAKE 1 TABLET(10 MG) BY MOUTH DAILY 90 tablet 3  . ferrous fumarate-iron polysaccharide complex (TANDEM) 162-115.2 MG CAPS capsule Take 1 capsule by mouth daily with breakfast.    . fish oil-omega-3 fatty acids 1000 MG capsule Take 3 g by mouth daily.      No current facility-administered medications for this visit.     Allergies:   Metoprolol and  Ultram [tramadol hcl]    Social History:  The patient  reports that he quit smoking about 35 years ago. His smoking use included Cigarettes. He has a 69.00 pack-year smoking history. He has never used smokeless tobacco. He reports that he does not drink alcohol or use drugs.   Family History:  [name] history includes Heart failure in his father; Hypertension in his sister; Lung cancer in his father; Throat cancer in his mother.    ROS:  Please see the history of present illness.   Otherwise, review of systems are positive for none.   All other systems are reviewed and negative.    PHYSICAL EXAM: VS:  BP (!) 122/56   Pulse (!) 54   Ht 6\' 3"  (1.905 m)   Wt 85.4 kg (188 lb 3.2 oz)   BMI 23.52 kg/m  , BMI Body mass index is 23.52 kg/m. GENERAL:  Well appearing.  No acute distress HEENT:  Pupils equal round and reactive, fundi not visualized, oral mucosa unremarkable NECK:  No jugular venous distention, waveform within normal limits, carotid upstroke brisk and symmetric, no bruits. LUNGS:  Clear to auscultation bilaterally.  No crackles, wheezes or rhonchi HEART: Regular rate and rhythm.  PMI not displaced or sustained,S within normal limits, prominent S2, no S3, no S4, no clicks, no rubs, II/VI systolic murmur at the LUSB. I/VI holosystolic murmur at the apex.  ABD:  Flat, positive bowel sounds normal in frequency in pitch, no bruits, no rebound, no guarding, no midline pulsatile mass, no hepatomegaly, no splenomegaly EXT:  2+ radial, DP, PT pulses bilaterally.  No edema, no cyanosis no clubbing SKIN:  No rashes no nodules NEURO:  Cranial nerves II through XII grossly intact, motor grossly intact throughout PSYCH:  Cognitively intact, oriented to person place and time  EKG:  EKG is ordered today.   03/27/16: sinus bradycardia rate 47 bpm. 04/11/16: Sinus rhythm rate 60 bpm. 03/02/17: Sinus bradycardia.  Rate 54 bpm.  PAC.    Echo 12/25/15: Study Conclusions  - Left  ventricle: The cavity size was normal. Wall thickness was   increased in a pattern of mild LVH. Systolic function was normal.   The estimated ejection fraction was in the range of 55% to 60%.   Wall motion was normal; there were no regional wall motion   abnormalities. Doppler parameters are consistent with abnormal   left ventricular relaxation (grade 1 diastolic dysfunction). - Aortic valve: There was a bioprosthetic aortic valve. No residual   vegetation noted. Bioprosthetic valve appears to function   normally. There was no significant regurgitation. Mean gradient   (S): 15 mm Hg. - Aorta: Borderline dilated aortic root. Aortic root dimension: 37   mm (ED). - Mitral valve: Mildly calcified annulus. Mildly calcified leaflets   . There was mild regurgitation. - Left atrium: The atrium was mildly dilated. -  Right ventricle: The cavity size was normal. Systolic function   was normal. - Right atrium: The atrium was mildly dilated. - Tricuspid valve: Peak RV-RA gradient (S): 24 mm Hg. - Pulmonary arteries: PA peak pressure: 32 mm Hg (S). - Systemic veins: IVC measured 2.1 cm with > 50% respirophasic   variation, suggesting RA pressure 8 mmHg.  Impressions:  - Normal LV size with mild LV hypertrophy. EF 55-60%. Normal RV   size and systolic function. Bioprosthetic aortic valve appeared   to function normally. No evidence for residual endocarditis on   this study.  LHC 09/2011: No CAD  Recent Labs: No results found for requested labs within last 8760 hours.  04/03/16: WBC 2.8, hemoglobin 12.9, hematocrit 38.1, platelets 125 08/11/16: Total cholesterol 145, triglycerides 107, HDL 35, LDL 89  Lipid Panel    Component Value Date/Time   CHOL 183 12/25/2015 1011   TRIG 97 12/25/2015 1011   HDL 34 (L) 12/25/2015 1011   CHOLHDL 5.4 (H) 12/25/2015 1011   VLDL 19 12/25/2015 1011   LDLCALC 130 (H) 12/25/2015 1011      Wt Readings from Last 3 Encounters:  03/02/17 85.4 kg (188 lb  3.2 oz)  08/11/16 85.5 kg (188 lb 6.4 oz)  04/11/16 87.2 kg (192 lb 3.2 oz)       ASSESSMENT AND PLAN:  # Bioprosthetic AVR # h/o MRSA endocarditis: Nicholas Walls is doing well.  There is no evidence of recurrent antibiotics.  He will need endocarditis prophylaxis for future dental procedures.  # Aortic aneurysm: Ascending aorta was 3.7 cm 12/2015 and increased to 3.9cm on 11/2016.  We will repeat 11/2017.  If stable we will not follow as closely.  # Bradycardia: Resolved after stopping metoprolol.  Symptoms have resolved.  He is not a good candidate for nodal agents in the future.  # Hyperlipidemia: Nicholas Walls statin was discontinued due to memory deficits.   He was started on Zetia due to hyperlipidemia.  Check lipids and CMP.  .   Current medicines are reviewed at length with the patient today.  The patient does not have concerns regarding medicines.  The following changes have been made:  no change  Labs/ tests ordered today include:   Orders Placed This Encounter  Procedures  . Lipid panel  . Comprehensive metabolic panel  . ECHOCARDIOGRAM COMPLETE     Disposition:   FU with Lakota Markgraf C. Duke Salvia, MD, [facility] in 6 months   This note was written with the assistance of speech recognition software.  Please excuse any transcriptional errors.  Signed, Cadden Elizondo C. Duke Salvia, MD, [facility]  03/02/2017 2:40 PM    [facility]

## 2017-03-03 DIAGNOSIS — E7800 Pure hypercholesterolemia, unspecified: Secondary | ICD-10-CM | POA: Diagnosis not present

## 2017-06-17 ENCOUNTER — Other Ambulatory Visit: Payer: Self-pay | Admitting: Cardiovascular Disease

## 2017-06-17 NOTE — Telephone Encounter (Signed)
Please review for refill. Thanks!  

## 2017-06-22 DIAGNOSIS — Z23 Encounter for immunization: Secondary | ICD-10-CM | POA: Diagnosis not present

## 2017-06-25 DIAGNOSIS — Z0000 Encounter for general adult medical examination without abnormal findings: Secondary | ICD-10-CM | POA: Diagnosis not present

## 2017-06-25 DIAGNOSIS — Z23 Encounter for immunization: Secondary | ICD-10-CM | POA: Diagnosis not present

## 2017-07-02 DIAGNOSIS — R591 Generalized enlarged lymph nodes: Secondary | ICD-10-CM | POA: Diagnosis not present

## 2017-07-02 DIAGNOSIS — R590 Localized enlarged lymph nodes: Secondary | ICD-10-CM | POA: Diagnosis not present

## 2017-07-31 DIAGNOSIS — Z87891 Personal history of nicotine dependence: Secondary | ICD-10-CM | POA: Diagnosis not present

## 2017-07-31 DIAGNOSIS — R590 Localized enlarged lymph nodes: Secondary | ICD-10-CM | POA: Diagnosis not present

## 2017-07-31 DIAGNOSIS — J439 Emphysema, unspecified: Secondary | ICD-10-CM | POA: Diagnosis not present

## 2017-08-27 ENCOUNTER — Encounter: Payer: Self-pay | Admitting: *Deleted

## 2017-09-04 ENCOUNTER — Ambulatory Visit (INDEPENDENT_AMBULATORY_CARE_PROVIDER_SITE_OTHER): Payer: Medicare Other | Admitting: Cardiovascular Disease

## 2017-09-04 ENCOUNTER — Encounter: Payer: Self-pay | Admitting: Cardiovascular Disease

## 2017-09-04 VITALS — BP 118/76 | HR 64 | Ht 74.0 in | Wt 186.4 lb

## 2017-09-04 DIAGNOSIS — Z953 Presence of xenogenic heart valve: Secondary | ICD-10-CM | POA: Diagnosis not present

## 2017-09-04 DIAGNOSIS — E7800 Pure hypercholesterolemia, unspecified: Secondary | ICD-10-CM | POA: Diagnosis not present

## 2017-09-04 DIAGNOSIS — D508 Other iron deficiency anemias: Secondary | ICD-10-CM

## 2017-09-04 DIAGNOSIS — Z5181 Encounter for therapeutic drug level monitoring: Secondary | ICD-10-CM | POA: Diagnosis not present

## 2017-09-04 MED ORDER — EZETIMIBE 10 MG PO TABS: 10.0000 mg | ORAL_TABLET | Freq: Every day | ORAL | 3 refills | Status: DC

## 2017-09-04 NOTE — Progress Notes (Signed)
Cardiology Office Note   Date:  09/04/2017   ID:  Nicholas Walls, DOB 12-May-1936, MRN [id number]  PCP:  Lorelei Pont, DO  Cardiologist:   Chilton Si, MD  Infectious Disease: Johny Sax, MD  No chief complaint on file.    History of Present Illness: Nicholas Walls is a 82 y.o. male status post bioprosthetic aortic valve replacement (10/2011) with endocarditis treated medically, mild ascending aorta aneurysm, hyperlipidemia, and OSA who presents for follow-up.   Nicholas Walls developed a vegetation on his prosthetic aortic valve 09/2015.  Blood cultures were positive for MRSA.  He was treated with 6 weeks of vancomycin, rifampin, and 2 weeks of gentamicin, ending on February 2017.   Antibiotics were discontinued and his PICC was removed on 11/06/15.  He had a follow up echo 12/2015 that did not reveal any recurrent endocarditis but did show grade 1 diastolic dysfunction. Nicholas Walls has been on Zetia given that he had worsened memory deficits on statins. He has tolerated this well and has no myalgias.  Since his last appointment Nicholas Walls has been doing well.  He denies any chest pain or shortness of breath.  He denies palpitations, lightheadedness or dizziness.  He has been caring for his wife and hasn't been working anymore.  He denies fever or chills.  He doesn't get much formal exercise, but sometimes they go walking at the mall or at [place].  He has no exertional symptoms.  He hasn't noticed any lower extremity edema, orthopnea or PND.  He has a diagnosis of OSA but doesn't like wearing his CPAP machine.  His wife notes that he no longer has as many apneic episodes.   Repeat lipids and echo 03/2018   Past Medical History:  Diagnosis Date  . Anemia   . Aortic stenosis, severe    s/p AVR with pericardial tissue valve Feb 2013 per Dr. Tyrone Sage  . Aortic valve defect   . Arthritis    "all over my body"  . Ascending aortic aneurysm (HCC) 08/11/2016   3.7cm on echo 12/2015    . Bradycardia by electrocardiogram   . 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 Full dentures   . Heart murmur   . History of anemia of chronic disease   . History of hiatal hernia   . Hyperlipidemia   . Hypertension   . Hyponatremia    Mild hyponatremia, allowed to self-correct, asymptomatic  . OSA (obstructive sleep apnea)    "suppose to wear mask; can't" (09/28/2015)  . Osteoarthritis     End-stage osteoarthritis--left knee  . Renal infarct (HCC) ~ 09/24/2015    Past Surgical History:  Procedure Laterality Date  . AORTIC VALVE REPLACEMENT  10/13/2011   Procedure: AORTIC VALVE REPLACEMENT (AVR);  Surgeon: [name], MD;  Location: [facility] OR;  Service: Open Heart Surgery;  Laterality: N/A;  . APPENDECTOMY    . CARDIAC CATHETERIZATION  2013  . CARDIAC VALVE REPLACEMENT    . CARPAL TUNNEL RELEASE  06/01/2012   Procedure: CARPAL TUNNEL RELEASE;  Surgeon: Wyn Forster., MD;  Location: [facility];  Service: Orthopedics;  Laterality: Right;  . CHOLECYSTECTOMY OPEN    . JOINT REPLACEMENT    . POSTERIOR FUSION CERVICAL SPINE    . TEE WITHOUT CARDIOVERSION N/A 09/26/2015   Procedure: TRANSESOPHAGEAL ECHOCARDIOGRAM (TEE);  Surgeon: Chrystie Nose, MD;  Location: [facility] ENDOSCOPY;  Service: Cardiovascular;  Laterality: N/A;  . TOTAL KNEE ARTHROPLASTY Bilateral   .  ULNAR NERVE TRANSPOSITION  06/01/2012   Procedure: ULNAR NERVE DECOMPRESSION/TRANSPOSITION;  Surgeon: Wyn Forster., MD;  Location: [facility];  Service: Orthopedics;  Laterality: Right;  right ulnar nerve decompression     Current Outpatient Medications  Medication Sig Dispense Refill  . acetaminophen (TYLENOL) 325 MG tablet Take 650 mg by mouth every 6 (six) hours as needed for moderate pain.    [name] aspirin 81 MG tablet Take 81 mg by mouth daily. Reported on 09/26/2015    . ezetimibe (ZETIA) 10 MG tablet TAKE 1 TABLET BY MOUTH  DAILY 90 tablet 2  . ferrous fumarate-iron  polysaccharide complex (TANDEM) 162-115.2 MG CAPS capsule Take 1 capsule by mouth daily with breakfast.    . fish oil-omega-3 fatty acids 1000 MG capsule Take 3 g by mouth daily.      No current facility-administered medications for this visit.     Allergies:   Metoprolol and Ultram [tramadol hcl]    Social History:  The patient  reports that he quit smoking about 36 years ago. His smoking use included cigarettes. He has a 69.00 pack-year smoking history. he has never used smokeless tobacco. He reports that he does not drink alcohol or use drugs.   Family History:  [name] history includes Heart failure in his father; Hypertension in his sister; Lung cancer in his father; Throat cancer in his mother.    ROS:  Please see the history of present illness.   Otherwise, review of systems are positive for none.   All other systems are reviewed and negative.    PHYSICAL EXAM: VS:  BP 118/76   Pulse 64   Ht 6\' 2"  (1.88 m)   Wt 186 lb 6.4 oz (84.6 kg)   BMI 23.93 kg/m  , BMI Body mass index is 23.93 kg/m. GENERAL:  Well appearing HEENT: Pupils equal round and reactive, fundi not visualized, oral mucosa unremarkable NECK:  No jugular venous distention, waveform within normal limits, carotid upstroke brisk and symmetric, no bruits, no thyromegaly LYMPHATICS:  No cervical adenopathy LUNGS:  Clear to auscultation bilaterally HEART:  RRR.  PMI not displaced or sustained,S1 and S2 within normal limits, no S3, no S4, no clicks, no rubs, II/VI systolic murmur at the LUSB.   ABD:  Flat, positive bowel sounds normal in frequency in pitch, no bruits, no rebound, no guarding, no midline pulsatile mass, no hepatomegaly, no splenomegaly EXT:  2 plus pulses throughout, no edema, no cyanosis no clubbing SKIN:  No rashes no nodules NEURO:  Cranial nerves II through XII grossly intact, motor grossly intact throughout PSYCH:  Cognitively intact, oriented to person place and time  EKG:  EKG is  not ordered today.   03/27/16: sinus bradycardia rate 47 bpm. 04/11/16: Sinus rhythm rate 60 bpm. 03/02/17: Sinus bradycardia.  Rate 54 bpm.  PAC.    Echo 12/25/15: Study Conclusions  - Left ventricle: The cavity size was normal. Wall thickness was   increased in a pattern of mild LVH. Systolic function was normal.   The estimated ejection fraction was in the range of 55% to 60%.   Wall motion was normal; there were no regional wall motion   abnormalities. Doppler parameters are consistent with abnormal   left ventricular relaxation (grade 1 diastolic dysfunction). - Aortic valve: There was a bioprosthetic aortic valve. No residual   vegetation noted. Bioprosthetic valve appears to function   normally. There was no significant regurgitation. Mean gradient   (S): 15 mm  Hg. - Aorta: Borderline dilated aortic root. Aortic root dimension: 37   mm (ED). - Mitral valve: Mildly calcified annulus. Mildly calcified leaflets   . There was mild regurgitation. - Left atrium: The atrium was mildly dilated. - Right ventricle: The cavity size was normal. Systolic function   was normal. - Right atrium: The atrium was mildly dilated. - Tricuspid valve: Peak RV-RA gradient (S): 24 mm Hg. - Pulmonary arteries: PA peak pressure: 32 mm Hg (S). - Systemic veins: IVC measured 2.1 cm with > 50% respirophasic   variation, suggesting RA pressure 8 mmHg.  Impressions:  - Normal LV size with mild LV hypertrophy. EF 55-60%. Normal RV   size and systolic function. Bioprosthetic aortic valve appeared   to function normally. No evidence for residual endocarditis on   this study.  LHC 09/2011: No CAD  Recent Labs: No results found for requested labs within last 8760 hours.  04/03/16: WBC 2.8, hemoglobin 12.9, hematocrit 38.1, platelets 125 08/11/16: Total cholesterol 145, triglycerides 107, HDL 35, LDL 89  Lipid Panel    Component Value Date/Time   CHOL 183 12/25/2015 1011   TRIG 97 12/25/2015 1011   HDL  34 (L) 12/25/2015 1011   CHOLHDL 5.4 (H) 12/25/2015 1011   VLDL 19 12/25/2015 1011   LDLCALC 130 (H) 12/25/2015 1011      Wt Readings from Last 3 Encounters:  09/04/17 186 lb 6.4 oz (84.6 kg)  03/02/17 188 lb 3.2 oz (85.4 kg)  08/11/16 188 lb 6.4 oz (85.5 kg)       ASSESSMENT AND PLAN:  # Bioprosthetic AVR # h/o MRSA endocarditis: Mr. Ozer is doing well.  There is no evidence of recurrent endocarditis.  He will need endocarditis prophylaxis for future dental procedures.  Repeat echo 03/2018.  We will check a CBC today.  # Aortic aneurysm: Ascending aorta was 3.7 cm 12/2015 and increased to 3.9 cm on 11/2016.  We will repeat 03/2018.  If stable we will not follow as closely.  # Bradycardia: Resolved after stopping metoprolol.  Symptoms have resolved.  He is not a good candidate for nodal agents in the future.  # Hyperlipidemia: Mr. Chacko statin was discontinued due to memory deficits.   He was started on Zetia due to hyperlipidemia.  Check lipids and CMP.    Current medicines are reviewed at length with the patient today.  The patient does not have concerns regarding medicines.  The following changes have been made:  no change  Labs/ tests ordered today include:   No orders of the defined types were placed in this encounter.    Disposition:   FU with [place]ow Abramo C. Duke Salvia, MD, [facility] in 6 months   This note was written with the assistance of speech recognition software.  Please excuse any transcriptional errors.  Signed, Makensey Rego C. Duke Salvia, MD, [facility]  09/04/2017 2:45 PM    [facility]

## 2017-09-04 NOTE — Patient Instructions (Signed)
Medication Instructions:  Your physician recommends that you continue on your current medications as directed. Please refer to the Current Medication list given to you today.  Labwork: FASTING LP/CMET/CBC PRIOR TO FOLLOW UP IN July   Testing/Procedures: Your physician has requested that you have an echocardiogram. Echocardiography is a painless test that uses sound waves to create images of your heart. It provides your doctor with information about the size and shape of your heart and how well your heart's chambers and valves are working. This procedure takes approximately one hour. There are no restrictions for this procedure. IN July   Follow-Up: Your physician wants you to follow-up in: IN July AFTER ECHO  You will receive a reminder letter in the mail two months in advance. If you don't receive a letter, please call our office to schedule the follow-up appointment.  If you need a refill on your cardiac medications before your next appointment, please call your pharmacy.

## 2017-09-06 ENCOUNTER — Encounter: Payer: Self-pay | Admitting: Cardiovascular Disease

## 2017-10-06 DIAGNOSIS — J0110 Acute frontal sinusitis, unspecified: Secondary | ICD-10-CM | POA: Diagnosis not present

## 2017-10-27 DIAGNOSIS — H2511 Age-related nuclear cataract, right eye: Secondary | ICD-10-CM | POA: Diagnosis not present

## 2017-11-17 ENCOUNTER — Telehealth: Payer: Self-pay | Admitting: *Deleted

## 2017-11-17 DIAGNOSIS — E785 Hyperlipidemia, unspecified: Secondary | ICD-10-CM

## 2017-11-17 DIAGNOSIS — Z5181 Encounter for therapeutic drug level monitoring: Secondary | ICD-10-CM

## 2017-11-17 DIAGNOSIS — D649 Anemia, unspecified: Secondary | ICD-10-CM

## 2017-11-17 NOTE — Telephone Encounter (Signed)
Patient scheduled for echo, labs, and follow up ov with Dr Duke Salvia per last ov

## 2017-12-09 ENCOUNTER — Other Ambulatory Visit (HOSPITAL_COMMUNITY): Payer: Medicare Other

## 2018-03-10 ENCOUNTER — Ambulatory Visit (HOSPITAL_COMMUNITY): Payer: Medicare Other | Attending: Cardiology

## 2018-03-10 ENCOUNTER — Other Ambulatory Visit: Payer: Medicare Other

## 2018-03-10 ENCOUNTER — Other Ambulatory Visit: Payer: Self-pay

## 2018-03-10 ENCOUNTER — Encounter (INDEPENDENT_AMBULATORY_CARE_PROVIDER_SITE_OTHER): Payer: Self-pay

## 2018-03-10 DIAGNOSIS — I712 Thoracic aortic aneurysm, without rupture: Secondary | ICD-10-CM | POA: Diagnosis not present

## 2018-03-10 DIAGNOSIS — Z5181 Encounter for therapeutic drug level monitoring: Secondary | ICD-10-CM | POA: Diagnosis not present

## 2018-03-10 DIAGNOSIS — I10 Essential (primary) hypertension: Secondary | ICD-10-CM | POA: Insufficient documentation

## 2018-03-10 DIAGNOSIS — Z953 Presence of xenogenic heart valve: Secondary | ICD-10-CM | POA: Diagnosis not present

## 2018-03-10 DIAGNOSIS — D649 Anemia, unspecified: Secondary | ICD-10-CM | POA: Diagnosis not present

## 2018-03-10 DIAGNOSIS — E785 Hyperlipidemia, unspecified: Secondary | ICD-10-CM | POA: Diagnosis not present

## 2018-03-10 DIAGNOSIS — G4733 Obstructive sleep apnea (adult) (pediatric): Secondary | ICD-10-CM | POA: Insufficient documentation

## 2018-03-10 DIAGNOSIS — I7121 Aneurysm of the ascending aorta, without rupture: Secondary | ICD-10-CM

## 2018-03-10 LAB — LIPID PANEL
CHOL/HDL RATIO: 3.6 ratio (ref 0.0–5.0)
CHOLESTEROL TOTAL: 144 mg/dL (ref 100–199)
HDL: 40 mg/dL (ref >39)
LDL CALC: 88 mg/dL (ref 0–99)
TRIGLYCERIDES: 78 mg/dL (ref 0–149)
VLDL Cholesterol Cal: 16 mg/dL (ref 5–40)

## 2018-03-10 LAB — CBC WITH DIFFERENTIAL/PLATELET
BASOS ABS: 0 10*3/uL (ref 0.0–0.2)
Basos: 1 %
EOS (ABSOLUTE): 0.3 10*3/uL (ref 0.0–0.4)
Eos: 10 %
HEMATOCRIT: 40.5 % (ref 37.5–51.0)
Hemoglobin: 14.2 g/dL (ref 13.0–17.7)
Immature Grans (Abs): 0 10*3/uL (ref 0.0–0.1)
Immature Granulocytes: 0 %
LYMPHS ABS: 0.9 10*3/uL (ref 0.7–3.1)
Lymphs: 30 %
MCH: 32.3 pg (ref 26.6–33.0)
MCHC: 35.1 g/dL (ref 31.5–35.7)
MCV: 92 fL (ref 79–97)
MONOS ABS: 0.3 10*3/uL (ref 0.1–0.9)
Monocytes: 10 %
NEUTROS ABS: 1.5 10*3/uL (ref 1.4–7.0)
Neutrophils: 49 %
Platelets: 140 10*3/uL — ABNORMAL LOW (ref 150–450)
RBC: 4.4 x10E6/uL (ref 4.14–5.80)
RDW: 13.6 % (ref 12.3–15.4)
WBC: 3 10*3/uL — AB (ref 3.4–10.8)

## 2018-03-10 LAB — COMPREHENSIVE METABOLIC PANEL
A/G RATIO: 2.2 (ref 1.2–2.2)
ALT: 20 IU/L (ref 0–44)
AST: 16 IU/L (ref 0–40)
Albumin: 4.3 g/dL (ref 3.5–4.7)
Alkaline Phosphatase: 70 IU/L (ref 39–117)
BUN/Creatinine Ratio: 12 (ref 10–24)
BUN: 14 mg/dL (ref 8–27)
Bilirubin Total: 0.7 mg/dL (ref 0.0–1.2)
CALCIUM: 9.1 mg/dL (ref 8.6–10.2)
CO2: 25 mmol/L (ref 20–29)
CREATININE: 1.2 mg/dL (ref 0.76–1.27)
Chloride: 100 mmol/L (ref 96–106)
GFR calc non Af Amer: 57 mL/min/{1.73_m2} — ABNORMAL LOW (ref >59)
GFR, EST AFRICAN AMERICAN: 66 mL/min/{1.73_m2} (ref >59)
Globulin, Total: 2 g/dL (ref 1.5–4.5)
Glucose: 85 mg/dL (ref 65–99)
Potassium: 5.2 mmol/L (ref 3.5–5.2)
Sodium: 140 mmol/L (ref 134–144)
Total Protein: 6.3 g/dL (ref 6.0–8.5)

## 2018-03-16 ENCOUNTER — Ambulatory Visit: Payer: Medicare Other | Admitting: Cardiovascular Disease

## 2018-04-21 DIAGNOSIS — H2511 Age-related nuclear cataract, right eye: Secondary | ICD-10-CM | POA: Diagnosis not present

## 2018-04-26 ENCOUNTER — Encounter: Payer: Self-pay | Admitting: *Deleted

## 2018-04-26 ENCOUNTER — Encounter: Payer: Self-pay | Admitting: Cardiovascular Disease

## 2018-04-26 ENCOUNTER — Ambulatory Visit (INDEPENDENT_AMBULATORY_CARE_PROVIDER_SITE_OTHER): Payer: Medicare Other | Admitting: Cardiovascular Disease

## 2018-04-26 VITALS — BP 126/60 | HR 60 | Ht 75.0 in | Wt 188.2 lb

## 2018-04-26 DIAGNOSIS — R001 Bradycardia, unspecified: Secondary | ICD-10-CM

## 2018-04-26 DIAGNOSIS — Z953 Presence of xenogenic heart valve: Secondary | ICD-10-CM | POA: Diagnosis not present

## 2018-04-26 DIAGNOSIS — Z01818 Encounter for other preprocedural examination: Secondary | ICD-10-CM | POA: Diagnosis not present

## 2018-04-26 DIAGNOSIS — E7800 Pure hypercholesterolemia, unspecified: Secondary | ICD-10-CM | POA: Diagnosis not present

## 2018-04-26 NOTE — Patient Instructions (Addendum)
Medication Instructions:  IT IS OK TO HOLD YOUR ASPIRIN A FEW DAYS PRIOR TO CATARACT SURGERY IF REQUIRED   Labwork: NONE  Testing/Procedures: NONE  Follow-Up: Your physician wants you to follow-up in: 6 MONTHS WITH PA/NP You will receive a reminder letter in the mail two months in advance. If you don't receive a letter, please call our office to schedule the follow-up appointment.  Your physician wants you to follow-up in: 1 YEAR WITH DR [facility]  You will receive a reminder letter in the mail two months in advance. If you don't receive a letter, please call our office to schedule the follow-up appointment.  Any Other Special Instructions Will Be Listed Below (If Applicable).  YOU ARE CLEARED FROM A CARDIAC STANDPOINT FOR YOUR UPCOMING CATARACT SURGERY   If you need a refill on your cardiac medications before your next appointment, please call your pharmacy.

## 2018-04-26 NOTE — Progress Notes (Signed)
Cardiology Office Note   Date:  04/26/2018   ID:  Nicholas Walls, DOB March 26, 1936, MRN [id number]  PCP:  Lorelei Pont, DO  Cardiologist:   Chilton Si, MD  Infectious Disease: Johny Sax, MD  No chief complaint on file.    History of Present Illness: Nicholas Walls is a 82 y.o. male status post bioprosthetic aortic valve replacement (10/2011) with endocarditis treated medically, mild ascending aorta aneurysm, hyperlipidemia, and OSA who presents for follow-up.   Nicholas Walls developed a vegetation on his prosthetic aortic valve 09/2015.  Blood cultures were positive for MRSA.  He was treated with 6 weeks of vancomycin, rifampin, and 2 weeks of gentamicin, ending on February 2017.   Antibiotics were discontinued and his PICC was removed on 11/06/15.  He had a follow up echo 12/2015 that did not reveal any recurrent endocarditis but did show grade 1 diastolic dysfunction. Nicholas Walls has been on Zetia given that he had worsened memory deficits on statins. He has tolerated this well and has no myalgias.  Since his last appointment Nicholas Walls has been doing well.  He likes to walk for exercise by going to [place] or the mall and walking around.  He has no exertional chest pain or shortness of breath.  He also denies lower extremity edema, orthopnea, or PND.  He has not noted any palpitations, fevers, or chills.  He is scheduled to have cataract surgery in 3 days.  He was requested that he have cardiac clearance prior to the procedure.  He has no chest pain or shortness of breath when walking up more than 1 flight of stairs.  He has no limitations on the distance with which he can walk.  He had a repeat echocardiogram 03/2018 that revealed LVEF 55 to 60% with grade 1 diastolic dysfunction.  His bioprosthetic aortic valve was functioning properly.  He checks his blood pressure occasionally at home and states that it has always been well-controlled.  At his last appointment he was started on  Zetia.  He has tolerated this well and denies any memory difficulty or muscle aches.   Past Medical History:  Diagnosis Date  . Anemia   . Aortic stenosis, severe    s/p AVR with pericardial tissue valve Feb 2013 per Dr. Tyrone Sage  . Aortic valve defect   . Arthritis    "all over my body"  . Ascending aortic aneurysm ([facility]) 08/11/2016   3.7cm on echo 12/2015  . Bradycardia by electrocardiogram   . 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 Full dentures   . Heart murmur   . History of anemia of chronic disease   . History of hiatal hernia   . Hyperlipidemia   . Hypertension   . Hyponatremia    Mild hyponatremia, allowed to self-correct, asymptomatic  . OSA (obstructive sleep apnea)    "suppose to wear mask; can't" (09/28/2015)  . Osteoarthritis     End-stage osteoarthritis--left knee  . Renal infarct ([facility]) ~ 09/24/2015    Past Surgical History:  Procedure Laterality Date  . AORTIC VALVE REPLACEMENT  10/13/2011   Procedure: AORTIC VALVE REPLACEMENT (AVR);  Surgeon: [name], MD;  Location: [facility] OR;  Service: Open Heart Surgery;  Laterality: N/A;  . APPENDECTOMY    . CARDIAC CATHETERIZATION  2013  . CARDIAC VALVE REPLACEMENT    . CARPAL TUNNEL RELEASE  06/01/2012   Procedure: CARPAL TUNNEL RELEASE;  Surgeon: Wyn Forster., MD;  Location:  [facility];  Service: Orthopedics;  Laterality: Right;  . CHOLECYSTECTOMY OPEN    . JOINT REPLACEMENT    . POSTERIOR FUSION CERVICAL SPINE    . TEE WITHOUT CARDIOVERSION N/A 09/26/2015   Procedure: TRANSESOPHAGEAL ECHOCARDIOGRAM (TEE);  Surgeon: Chrystie Nose, MD;  Location: [facility] ENDOSCOPY;  Service: Cardiovascular;  Laterality: N/A;  . TOTAL KNEE ARTHROPLASTY Bilateral   . ULNAR NERVE TRANSPOSITION  06/01/2012   Procedure: ULNAR NERVE DECOMPRESSION/TRANSPOSITION;  Surgeon: Wyn Forster., MD;  Location: [facility];  Service: Orthopedics;  Laterality: Right;  right ulnar nerve  decompression     Current Outpatient Medications  Medication Sig Dispense Refill  . acetaminophen (TYLENOL) 325 MG tablet Take 650 mg by mouth every 6 (six) hours as needed for moderate pain.    [name] aspirin 81 MG tablet Take 81 mg by mouth daily. Reported on 09/26/2015    . ezetimibe (ZETIA) 10 MG tablet Take 1 tablet (10 mg total) by mouth daily. 90 tablet 3  . ferrous fumarate-iron polysaccharide complex (TANDEM) 162-115.2 MG CAPS capsule Take 1 capsule by mouth daily with breakfast.    . fish oil-omega-3 fatty acids 1000 MG capsule Take 3 g by mouth daily.      No current facility-administered medications for this visit.     Allergies:   Metoprolol and Ultram [tramadol hcl]    Social History:  The patient  reports that he quit smoking about 36 years ago. His smoking use included cigarettes. He has a 69.00 pack-year smoking history. He has never used smokeless tobacco. He reports that he does not drink alcohol or use drugs.   Family History:  [name] history includes Heart failure in his father; Hypertension in his sister; Lung cancer in his father; Throat cancer in his mother.    ROS:  Please see the history of present illness.   Otherwise, review of systems are positive for none.   All other systems are reviewed and negative.    PHYSICAL EXAM: VS:  BP 126/60   Pulse 60   Ht 6\' 3"  (1.905 m)   Wt 188 lb 3.2 oz (85.4 kg)   BMI 23.52 kg/m  , BMI Body mass index is 23.52 kg/m. GENERAL:  Well appearing HEENT: Pupils equal round and reactive, fundi not visualized, oral mucosa unremarkable NECK:  No jugular venous distention, waveform within normal limits, carotid upstroke brisk and symmetric, no bruits, no thyromegaly LYMPHATICS:  No cervical adenopathy LUNGS:  Clear to auscultation bilaterally HEART:  RRR.  PMI not displaced or sustained,S1 and S2 within normal limits, no S3, no S4, no clicks, no rubs, II/VI systolic murmur at the LUSB.   ABD:  Flat, positive bowel  sounds normal in frequency in pitch, no bruits, no rebound, no guarding, no midline pulsatile mass, no hepatomegaly, no splenomegaly EXT:  2 plus pulses throughout, no edema, no cyanosis no clubbing SKIN:  No rashes no nodules NEURO:  Cranial nerves II through XII grossly intact, motor grossly intact throughout PSYCH:  Cognitively intact, oriented to person place and time  EKG:  EKG is ordered today.   03/27/16: sinus bradycardia rate 47 bpm. 04/11/16: Sinus rhythm rate 60 bpm. 03/02/17: Sinus bradycardia.  Rate 54 bpm.  PAC.  04/26/18: Sinus rhythm.  Rate 60 bpm.  One PVC.   Echo 12/25/15: Study Conclusions  - Left ventricle: The cavity size was normal. Wall thickness was   increased in a pattern of mild LVH. Systolic function was normal.  The estimated ejection fraction was in the range of 55% to 60%.   Wall motion was normal; there were no regional wall motion   abnormalities. Doppler parameters are consistent with abnormal   left ventricular relaxation (grade 1 diastolic dysfunction). - Aortic valve: There was a bioprosthetic aortic valve. No residual   vegetation noted. Bioprosthetic valve appears to function   normally. There was no significant regurgitation. Mean gradient   (S): 15 mm Hg. - Aorta: Borderline dilated aortic root. Aortic root dimension: 37   mm (ED). - Mitral valve: Mildly calcified annulus. Mildly calcified leaflets   . There was mild regurgitation. - Left atrium: The atrium was mildly dilated. - Right ventricle: The cavity size was normal. Systolic function   was normal. - Right atrium: The atrium was mildly dilated. - Tricuspid valve: Peak RV-RA gradient (S): 24 mm Hg. - Pulmonary arteries: PA peak pressure: 32 mm Hg (S). - Systemic veins: IVC measured 2.1 cm with > 50% respirophasic   variation, suggesting RA pressure 8 mmHg.  Impressions:  - Normal LV size with mild LV hypertrophy. EF 55-60%. Normal RV   size and systolic function. Bioprosthetic  aortic valve appeared   to function normally. No evidence for residual endocarditis on   this study.  Echo 03/2018: Study Conclusions  - Left ventricle: The cavity size was normal. Wall thickness was   normal. Systolic function was normal. The estimated ejection   fraction was in the range of 55% to 60%. Wall motion was normal;   there were no regional wall motion abnormalities. Doppler   parameters are consistent with abnormal left ventricular   relaxation (grade 1 diastolic dysfunction). - Aortic valve: A bioprosthesis was present. - Mitral valve: Calcified annulus. There was mild regurgitation. - Left atrium: The atrium was mildly dilated. - Right atrium: The atrium was mildly dilated. - Pulmonary arteries: PA peak pressure: 40 mm Hg (S).  Impressions:  - Normal LV systolic function; mild diastolic dysfunction; s/p AVR   with no AI; mild MR; mild biatrial enlargement.  LHC 09/2011: No CAD  Recent Labs: 03/10/2018: ALT 20; BUN 14; Creatinine, Ser 1.20; Hemoglobin 14.2; Platelets 140; Potassium 5.2; Sodium 140  04/03/16: WBC 2.8, hemoglobin 12.9, hematocrit 38.1, platelets 125 08/11/16: Total cholesterol 145, triglycerides 107, HDL 35, LDL 89  Lipid Panel    Component Value Date/Time   CHOL 144 03/10/2018 0925   TRIG 78 03/10/2018 0925   HDL 40 03/10/2018 0925   CHOLHDL 3.6 03/10/2018 0925   CHOLHDL 5.4 (H) 12/25/2015 1011   VLDL 19 12/25/2015 1011   LDLCALC 88 03/10/2018 0925      Wt Readings from Last 3 Encounters:  04/26/18 188 lb 3.2 oz (85.4 kg)  09/04/17 186 lb 6.4 oz (84.6 kg)  03/02/17 188 lb 3.2 oz (85.4 kg)       ASSESSMENT AND PLAN:  # Pre-operative risk assessment:   Nicholas Walls has no unstable cardiac conditions.  He has no physical limitations and is at low risk for surgery.  It is okay to hold aspirin for 3 days should his ophthalmologist deem this necessary.  # Bioprosthetic AVR # h/o MRSA endocarditis: Nicholas Walls is doing well.  There is no  evidence of recurrent endocarditis.  He will need endocarditis prophylaxis for future dental procedures.  Repeat echo 03/2018.  We will check a CBC today.  # Aortic aneurysm: Ascending aorta was 3.7 cm 12/2015 and increased to 3.9 cm on 11/2016.  On 03/2018 it was 3.5,  though not much of the ascending aorta was visualized.    # Bradycardia: Resolved after stopping metoprolol.  Symptoms have resolved.  He is not a good candidate for nodal agents in the future.   # Hyperlipidemia: Nicholas Walls statin was discontinued due to memory deficits.   He is doing well on Zetia.  LDL was 88 on 03/2018.   # Elevated BP: BP was initially elevated but better on repeat.  We will continue to monitor.   Current medicines are reviewed at length with the patient today.  The patient does not have concerns regarding medicines.  The following changes have been made:  no change  Labs/ tests ordered today include:   Orders Placed This Encounter  Procedures  . EKG 12-Lead     Disposition:   FU with Nicholas Walls C. Duke Salvia, MD, [facility] in 1 year.  APP in 6 months.   Signed, Janoah Menna C. Duke Salvia, MD, [facility]  04/26/2018 11:36 AM    [facility]

## 2018-04-29 DIAGNOSIS — H2511 Age-related nuclear cataract, right eye: Secondary | ICD-10-CM | POA: Diagnosis not present

## 2018-06-11 DIAGNOSIS — Z23 Encounter for immunization: Secondary | ICD-10-CM | POA: Diagnosis not present

## 2018-06-16 DIAGNOSIS — H2512 Age-related nuclear cataract, left eye: Secondary | ICD-10-CM | POA: Diagnosis not present

## 2018-06-21 DIAGNOSIS — R590 Localized enlarged lymph nodes: Secondary | ICD-10-CM | POA: Diagnosis not present

## 2018-08-02 DIAGNOSIS — Z87891 Personal history of nicotine dependence: Secondary | ICD-10-CM | POA: Diagnosis not present

## 2018-08-02 DIAGNOSIS — J439 Emphysema, unspecified: Secondary | ICD-10-CM | POA: Diagnosis not present

## 2018-08-02 DIAGNOSIS — R590 Localized enlarged lymph nodes: Secondary | ICD-10-CM | POA: Diagnosis not present

## 2018-08-09 DIAGNOSIS — Z952 Presence of prosthetic heart valve: Secondary | ICD-10-CM | POA: Diagnosis not present

## 2018-08-09 DIAGNOSIS — J439 Emphysema, unspecified: Secondary | ICD-10-CM | POA: Diagnosis not present

## 2018-08-09 DIAGNOSIS — I501 Left ventricular failure: Secondary | ICD-10-CM | POA: Diagnosis not present

## 2018-08-09 DIAGNOSIS — Z0000 Encounter for general adult medical examination without abnormal findings: Secondary | ICD-10-CM | POA: Diagnosis not present

## 2018-08-09 DIAGNOSIS — L821 Other seborrheic keratosis: Secondary | ICD-10-CM | POA: Diagnosis not present

## 2018-08-09 DIAGNOSIS — N280 Ischemia and infarction of kidney: Secondary | ICD-10-CM | POA: Diagnosis not present

## 2018-08-09 DIAGNOSIS — D696 Thrombocytopenia, unspecified: Secondary | ICD-10-CM | POA: Diagnosis not present

## 2018-09-22 ENCOUNTER — Other Ambulatory Visit: Payer: Self-pay | Admitting: *Deleted

## 2018-09-23 MED ORDER — EZETIMIBE 10 MG PO TABS: 10.0000 mg | ORAL_TABLET | Freq: Every day | ORAL | 3 refills | Status: DC

## 2018-09-23 NOTE — Telephone Encounter (Signed)
Refilled Zetia as requested.

## 2018-10-29 ENCOUNTER — Encounter: Payer: Self-pay | Admitting: Cardiology

## 2018-10-29 ENCOUNTER — Ambulatory Visit (INDEPENDENT_AMBULATORY_CARE_PROVIDER_SITE_OTHER): Payer: Medicare Other | Admitting: Cardiology

## 2018-10-29 VITALS — BP 118/67 | HR 80 | Ht 74.5 in | Wt 191.2 lb

## 2018-10-29 DIAGNOSIS — J439 Emphysema, unspecified: Secondary | ICD-10-CM

## 2018-10-29 DIAGNOSIS — I7121 Aneurysm of the ascending aorta, without rupture: Secondary | ICD-10-CM

## 2018-10-29 DIAGNOSIS — I712 Thoracic aortic aneurysm, without rupture: Secondary | ICD-10-CM

## 2018-10-29 DIAGNOSIS — E785 Hyperlipidemia, unspecified: Secondary | ICD-10-CM

## 2018-10-29 DIAGNOSIS — IMO0001 Reserved for inherently not codable concepts without codable children: Secondary | ICD-10-CM

## 2018-10-29 DIAGNOSIS — Z953 Presence of xenogenic heart valve: Secondary | ICD-10-CM | POA: Diagnosis not present

## 2018-10-29 DIAGNOSIS — N280 Ischemia and infarction of kidney: Secondary | ICD-10-CM | POA: Diagnosis not present

## 2018-10-29 DIAGNOSIS — I38 Endocarditis, valve unspecified: Secondary | ICD-10-CM

## 2018-10-29 DIAGNOSIS — G4730 Sleep apnea, unspecified: Secondary | ICD-10-CM

## 2018-10-29 DIAGNOSIS — T826XXD Infection and inflammatory reaction due to cardiac valve prosthesis, subsequent encounter: Secondary | ICD-10-CM

## 2018-10-29 DIAGNOSIS — Z0389 Encounter for observation for other suspected diseases and conditions ruled out: Secondary | ICD-10-CM

## 2018-10-29 NOTE — Assessment & Plan Note (Signed)
Statin intolerance- memory issues

## 2018-10-29 NOTE — Assessment & Plan Note (Signed)
Normal on echo July 2019

## 2018-10-29 NOTE — Progress Notes (Signed)
10/29/2018 Nicholas Walls   03/11/1936  [id number]  Primary Physician Lorelei Pont, DO Primary Cardiologist: Dr Duke Salvia   HPI:  Mr. Nicholas Walls is a pleasant 83 year old male from [place] who had a tissue AVR in 2013.  He had normal coronaries at that time.  In 2017 he presented as a transfer from [facility] with a renal infarct.  It turns out he had endocarditis.  He was treated with 6 weeks of IV antibiotics.  He is recovered fully.  His last echocardiogram was July 2019 and showed normal valve function with an ejection fraction of 55 to 60%.  A sending aortic dilatation is been mentioned on previous studies but not on this 1.  He has been tried on statin therapy in the past but had some memory issues, he was placed on Zetia a few months ago.  He is in the office today for routine follow-up.  He says he is doing well, he does not feel like he is limited in what he can do physically.  He is not been admitted to the [facility] in [place] or had any recent febrile illnesses.    Current Outpatient Medications  Medication Sig Dispense Refill  . acetaminophen (TYLENOL) 325 MG tablet Take 650 mg by mouth every 6 (six) hours as needed for moderate pain.    [name] aspirin 81 MG tablet Take 81 mg by mouth daily. Reported on 09/26/2015    . ezetimibe (ZETIA) 10 MG tablet Take 1 tablet (10 mg total) by mouth daily. 90 tablet 3  . ferrous fumarate-iron polysaccharide complex (TANDEM) 162-115.2 MG CAPS capsule Take 1 capsule by mouth daily with breakfast.    . fish oil-omega-3 fatty acids 1000 MG capsule Take 3 g by mouth daily.      No current facility-administered medications for this visit.     Allergies  Allergen Reactions  . Metoprolol     HR 47 on metoprolol 12.5 mg daily.  [name] Ultram [Tramadol Hcl]     "seeing things"    Past Medical History:  Diagnosis Date  . Anemia   . Aortic stenosis, severe    s/p AVR with pericardial tissue valve Feb 2013 per Dr. Tyrone Sage    . Aortic valve defect   . Arthritis    "all over my body"  . Ascending aortic aneurysm (HCC) 08/11/2016   3.7cm on echo 12/2015  . Bradycardia by electrocardiogram   . 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 Full dentures   . Heart murmur   . History of anemia of chronic disease   . History of hiatal hernia   . Hyperlipidemia   . Hypertension   . Hyponatremia    Mild hyponatremia, allowed to self-correct, asymptomatic  . OSA (obstructive sleep apnea)    "suppose to wear mask; can't" (09/28/2015)  . Osteoarthritis     End-stage osteoarthritis--left knee  . Renal infarct (HCC) ~ 09/24/2015    Social History   Socioeconomic History  . Marital status: Married    Spouse name: Not on file  . Number of children: Not on file  . Years of education: Not on file  . Highest education level: Not on file  Occupational History  . Not on file  Social Needs  . Financial resource strain: Not on file  . Food insecurity:    Worry: Not on file    Inability: Not on file  . Transportation needs:    Medical: Not on file  Non-medical: Not on file  Tobacco Use  . Smoking status: Former Smoker    Packs/day: 3.00    Years: 23.00    Pack years: 69.00    Types: Cigarettes    Last attempt to quit: 09/01/1981    Years since quitting: 37.1  . Smokeless tobacco: Never Used  Substance and Sexual Activity  . Alcohol use: No    Alcohol/week: 12.0 standard drinks    Types: 12 Cans of beer per week    Comment: "gave it up"  . Drug use: No  . Sexual activity: Not on file  Lifestyle  . Physical activity:    Days per week: Not on file    Minutes per session: Not on file  . Stress: Not on file  Relationships  . Social connections:    Talks on phone: Not on file    Gets together: Not on file    Attends religious service: Not on file    Active member of club or organization: Not on file    Attends meetings of clubs or organizations: Not on file    Relationship status: Not on  file  . Intimate partner violence:    Fear of current or ex partner: Not on file    Emotionally abused: Not on file    Physically abused: Not on file    Forced sexual activity: Not on file  Other Topics Concern  . Not on file  Social History Narrative  . Not on file     Family History  Problem Relation Age of Onset  . Throat cancer Mother   . Heart failure Father   . Lung cancer Father   . Hypertension Sister      Review of Systems: General: negative for chills, fever, night sweats or weight changes.  Cardiovascular: negative for chest pain, dyspnea on exertion, edema, orthopnea, palpitations, paroxysmal nocturnal dyspnea or shortness of breath Dermatological: negative for rash Respiratory: negative for cough or wheezing Urologic: negative for hematuria Abdominal: negative for nausea, vomiting, diarrhea, bright red blood per rectum, melena, or hematemesis Neurologic: negative for visual changes, syncope, or dizziness All other systems reviewed and are otherwise negative except as noted above.    Blood pressure 118/67, pulse 80, height 6' 2.5" (1.892 m), weight 191 lb 3.2 oz (86.7 kg).  General appearance: alert, cooperative and no distress Neck: no carotid bruit and no JVD Lungs: clear to auscultation bilaterally Heart: regular rate and rhythm Extremities: No edema Skin: warm and dry Neurologic: Grossly normal  EKG NSR, PACs, NSST changes  ASSESSMENT AND PLAN:   S/P aortic valve replacement with bioprosthetic valve Patient had an echo July 2019 which showed normal LV function with an ejection fraction of 55-60% and no AI.  Renal infarction [facility]) Jan 2017- transferred from [facility] secondary to prosthetic valve endocarditis  Prosthetic valve endocarditis (HCC) 09-2015: MRSE-6 weeks of IV ABs  Dyslipidemia Statin intolerance- memory issues  Emphysema/COPD [facility]) On chest CT Jan 2017  Ascending aortic aneurysm (HCC) Normal on echo July  2019  Sleep apnea Not compliant with C-pap  Normal coronary arteries Normal coronaries at cath pre AVR   PLAN  Same Rx- f/u Dr Lenora Boys [facility] PA-C 10/29/2018 1:48 PM

## 2018-10-29 NOTE — Assessment & Plan Note (Signed)
On chest CT Jan 2017

## 2018-10-29 NOTE — Assessment & Plan Note (Signed)
09-2015: MRSE-6 weeks of IV ABs

## 2018-10-29 NOTE — Assessment & Plan Note (Signed)
Normal coronaries at cath pre AVR

## 2018-10-29 NOTE — Assessment & Plan Note (Signed)
Patient had an echo July 2019 which showed normal LV function with an ejection fraction of 55-60% and no AI.

## 2018-10-29 NOTE — Assessment & Plan Note (Signed)
Not compliant with C-pap 

## 2018-10-29 NOTE — Assessment & Plan Note (Signed)
Jan 2017- transferred from [facility] secondary to prosthetic valve endocarditis

## 2018-10-29 NOTE — Patient Instructions (Signed)
Medication Instructions:  Your physician recommends that you continue on your current medications as directed. Please refer to the Current Medication list given to you today. If you need a refill on your cardiac medications before your next appointment, please call your pharmacy.   Lab work: None  If you have labs (blood work) drawn today and your tests are completely normal, you will receive your results only by: [name] MyChart Message (if you have MyChart) OR . A paper copy in the mail If you have any lab test that is abnormal or we need to change your treatment, we will call you to review the results.  Testing/Procedures: None   Follow-Up: At [facility], you and your health needs are our priority.  As part of our continuing mission to provide you with exceptional heart care, we have created designated Provider Care Teams.  These Care Teams include your primary Cardiologist (physician) and Advanced Practice Providers (APPs -  Physician Assistants and Nurse Practitioners) who all work together to provide you with the care you need, when you need it. You will need a follow up appointment in 6-7 months.  Please call our office 2 months in advance to schedule this appointment.  You may see Chilton Si, MD or one of the following Advanced Practice Providers on your designated Care Team:   [name], PA-C Judy Pimple, [place] . Marjie Skiff, PA-C  Any Other Special Instructions Will Be Listed Below (If Applicable).

## 2019-06-01 ENCOUNTER — Telehealth: Payer: Self-pay | Admitting: *Deleted

## 2019-06-01 DIAGNOSIS — Z953 Presence of xenogenic heart valve: Secondary | ICD-10-CM

## 2019-06-01 NOTE — Telephone Encounter (Signed)
Patient scheduled for Echo same day as follow up since he lives out of town.

## 2019-06-17 ENCOUNTER — Other Ambulatory Visit (HOSPITAL_COMMUNITY): Payer: Medicare Other

## 2019-06-17 ENCOUNTER — Ambulatory Visit (INDEPENDENT_AMBULATORY_CARE_PROVIDER_SITE_OTHER): Payer: Medicare Other | Admitting: Cardiovascular Disease

## 2019-06-17 ENCOUNTER — Other Ambulatory Visit: Payer: Self-pay

## 2019-06-17 ENCOUNTER — Ambulatory Visit (HOSPITAL_COMMUNITY): Payer: Medicare Other | Attending: Cardiology

## 2019-06-17 ENCOUNTER — Encounter: Payer: Self-pay | Admitting: Cardiovascular Disease

## 2019-06-17 VITALS — BP 110/62 | HR 69 | Temp 97.2°F | Ht 74.0 in | Wt 177.8 lb

## 2019-06-17 DIAGNOSIS — I712 Thoracic aortic aneurysm, without rupture: Secondary | ICD-10-CM | POA: Diagnosis not present

## 2019-06-17 DIAGNOSIS — Z953 Presence of xenogenic heart valve: Secondary | ICD-10-CM | POA: Insufficient documentation

## 2019-06-17 DIAGNOSIS — E7800 Pure hypercholesterolemia, unspecified: Secondary | ICD-10-CM | POA: Diagnosis not present

## 2019-06-17 DIAGNOSIS — R001 Bradycardia, unspecified: Secondary | ICD-10-CM

## 2019-06-17 DIAGNOSIS — I7121 Aneurysm of the ascending aorta, without rupture: Secondary | ICD-10-CM

## 2019-06-17 NOTE — Patient Instructions (Signed)
Medication Instructions:  TAKE ASPIRIN 81 MG DAILY   *If you need a refill on your cardiac medications before your next appointment, please call your pharmacy*  Lab Work: LIPID AT [facility]  If you have labs (blood work) drawn today and your tests are completely normal, you will receive your results only by: [name] MyChart Message (if you have MyChart) OR . A paper copy in the mail If you have any lab test that is abnormal or we need to change your treatment, we will call you to review the results.  Testing/Procedures: NONE  Follow-Up: At [facility], you and your health needs are our priority.  As part of our continuing mission to provide you with exceptional heart care, we have created designated Provider Care Teams.  These Care Teams include your primary Cardiologist (physician) and Advanced Practice Providers (APPs -  Physician Assistants and Nurse Practitioners) who all work together to provide you with the care you need, when you need it.  Your next appointment:   12 months You will receive a reminder letter in the mail two months in advance. If you don't receive a letter, please call our office to schedule the follow-up appointment.  The format for your next appointment:   In Person  Provider:   You may see Skeet Latch, MD or one of the following Advanced Practice Providers on your designated Care Team:    Kerin Ransom, PA-C  [employer], [place]  Coletta Memos, [place]

## 2019-06-17 NOTE — Progress Notes (Signed)
Cardiology Office Note   Date:  06/17/2019   ID:  Nicholas Walls, DOB 25-Oct-1935, MRN [id number]  PCP:  Emelda Fear, DO  Cardiologist:   Skeet Latch, MD  Infectious Disease: Bobby Rumpf, MD  No chief complaint on file.    History of Present Illness: Nicholas Walls is a 83 y.o. male status post bioprosthetic aortic valve replacement (10/2011) with endocarditis treated medically, mild ascending aorta aneurysm, hyperlipidemia, and OSA who presents for follow-up.   Nicholas Walls developed a vegetation on his prosthetic aortic valve 09/2015.  Blood cultures were positive for MRSA.  He was treated with 6 weeks of vancomycin, rifampin, and 2 weeks of gentamicin, ending on February 2017.   Antibiotics were discontinued and his PICC was removed on 11/06/15.  He had a follow up echo 12/2015 that did not reveal any recurrent endocarditis but did show grade 1 diastolic dysfunction. Nicholas Walls has been on Zetia given that he had worsened memory deficits on statins. He has tolerated this well and has no myalgias.  Nicholas Walls has been doing well.  He is without any physical complaints.  He has no chest pain and his breathing is stable.  He denies any lower extremity edema, orthopnea, or PND.  He does notice that sometimes when he stands up he gets a little lightheaded.  He denies any syncope or presyncope.  It gets better within a couple seconds.  He recently stopped working gave away all his tools.  He has been busy with cleaning out his shed.  He occasionally checks his blood pressure and heart rate at home and they have been well-controlled.   Past Medical History:  Diagnosis Date  . Anemia   . Aortic stenosis, severe    s/p AVR with pericardial tissue valve Feb 2013 per Dr. Servando Snare  . Aortic valve defect   . Arthritis    "all over my body"  . Ascending aortic aneurysm ([place]) 08/11/2016   3.7cm on echo 12/2015  . Bradycardia by electrocardiogram   . 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 Full dentures   . Heart murmur   . History of anemia of chronic disease   . History of hiatal hernia   . Hyperlipidemia   . Hypertension   . Hyponatremia    Mild hyponatremia, allowed to self-correct, asymptomatic  . OSA (obstructive sleep apnea)    "suppose to wear mask; can't" (09/28/2015)  . Osteoarthritis     End-stage osteoarthritis--left knee  . Renal infarct ([place]) ~ 09/24/2015    Past Surgical History:  Procedure Laterality Date  . AORTIC VALVE REPLACEMENT  10/13/2011   Procedure: AORTIC VALVE REPLACEMENT (AVR);  Surgeon: Grace Isaac, MD;  Location: [facility];  Service: Open Heart Surgery;  Laterality: N/A;  . APPENDECTOMY    . CARDIAC CATHETERIZATION  2013  . CARDIAC VALVE REPLACEMENT    . CARPAL TUNNEL RELEASE  06/01/2012   Procedure: CARPAL TUNNEL RELEASE;  Surgeon: Cammie Sickle., MD;  Location: [facility];  Service: Orthopedics;  Laterality: Right;  . CHOLECYSTECTOMY OPEN    . JOINT REPLACEMENT    . POSTERIOR FUSION CERVICAL SPINE    . TEE WITHOUT CARDIOVERSION N/A 09/26/2015   Procedure: TRANSESOPHAGEAL ECHOCARDIOGRAM (TEE);  Surgeon: Pixie Casino, MD;  Location: [facility] ENDOSCOPY;  Service: Cardiovascular;  Laterality: N/A;  . TOTAL KNEE ARTHROPLASTY Bilateral   . ULNAR NERVE TRANSPOSITION  06/01/2012   Procedure: ULNAR NERVE DECOMPRESSION/TRANSPOSITION;  Surgeon: Wyn Forster., MD;  Location: [facility];  Service: Orthopedics;  Laterality: Right;  right ulnar nerve decompression     Current Outpatient Medications  Medication Sig Dispense Refill  . acetaminophen (TYLENOL) 325 MG tablet Take 650 mg by mouth every 6 (six) hours as needed for moderate pain.    [name] ezetimibe (ZETIA) 10 MG tablet Take 1 tablet (10 mg total) by mouth daily. 90 tablet 3  . ferrous fumarate-iron polysaccharide complex (TANDEM) 162-115.2 MG CAPS capsule Take 1 capsule by mouth daily with breakfast.    . fish oil-omega-3 fatty acids  1000 MG capsule Take 3 g by mouth daily.     [name] aspirin EC 81 MG tablet Take 81 mg by mouth daily.     No current facility-administered medications for this visit.     Allergies:   Metoprolol and Ultram [tramadol hcl]    Social History:  The patient  reports that he quit smoking about 37 years ago. His smoking use included cigarettes. He has a 69.00 pack-year smoking history. He has never used smokeless tobacco. He reports that he does not drink alcohol or use drugs.   Family History:  [name] history includes Heart failure in his father; Hypertension in his sister; Lung cancer in his father; Throat cancer in his mother.    ROS:  Please see the history of present illness.   Otherwise, review of systems are positive for none.   All other systems are reviewed and negative.    PHYSICAL EXAM: VS:  BP 110/62   Pulse 69   Temp (!) 97.2 F (36.2 C)   Ht 6\' 2"  (1.88 m)   Wt 177 lb 12.8 oz (80.6 kg)   SpO2 98%   BMI 22.83 kg/m  , BMI Body mass index is 22.83 kg/m. GENERAL:  Well appearing HEENT: Pupils equal round and reactive, fundi not visualized, oral mucosa unremarkable NECK:  No jugular venous distention, waveform within normal limits, carotid upstroke brisk and symmetric, no bruits LUNGS:  Clear to auscultation bilaterally HEART:  RRR.  PMI not displaced or sustained,S1 and S2 within normal limits, no S3, no S4, no clicks, no rubs, II/VI systolic murmur at the LUSB.   ABD:  Flat, positive bowel sounds normal in frequency in pitch, no bruits, no rebound, no guarding, no midline pulsatile mass, no hepatomegaly, no splenomegaly EXT:  2 plus pulses throughout, no edema, no cyanosis no clubbing SKIN:  No rashes no nodules NEURO:  Cranial nerves II through XII grossly intact, motor grossly intact throughout PSYCH:  Cognitively intact, oriented to person place and time  EKG:  EKG is ordered today.   03/27/16: sinus bradycardia rate 47 bpm. 04/11/16: Sinus rhythm rate 60 bpm.  03/02/17: Sinus bradycardia.  Rate 54 bpm.  PAC.  04/26/18: Sinus rhythm.  Rate 60 bpm.  One PVC.  06/17/2019: Sinus bradycardia.  Rate 55 bpm.  Echo 12/25/15: Study Conclusions  - Left ventricle: The cavity size was normal. Wall thickness was   increased in a pattern of mild LVH. Systolic function was normal.   The estimated ejection fraction was in the range of 55% to 60%.   Wall motion was normal; there were no regional wall motion   abnormalities. Doppler parameters are consistent with abnormal   left ventricular relaxation (grade 1 diastolic dysfunction). - Aortic valve: There was a bioprosthetic aortic valve. No residual   vegetation noted. Bioprosthetic valve appears to function   normally. There was no  significant regurgitation. Mean gradient   (S): 15 mm Hg. - Aorta: Borderline dilated aortic root. Aortic root dimension: 37   mm (ED). - Mitral valve: Mildly calcified annulus. Mildly calcified leaflets   . There was mild regurgitation. - Left atrium: The atrium was mildly dilated. - Right ventricle: The cavity size was normal. Systolic function   was normal. - Right atrium: The atrium was mildly dilated. - Tricuspid valve: Peak RV-RA gradient (S): 24 mm Hg. - Pulmonary arteries: PA peak pressure: 32 mm Hg (S). - Systemic veins: IVC measured 2.1 cm with > 50% respirophasic   variation, suggesting RA pressure 8 mmHg.  Impressions:  - Normal LV size with mild LV hypertrophy. EF 55-60%. Normal RV   size and systolic function. Bioprosthetic aortic valve appeared   to function normally. No evidence for residual endocarditis on   this study.  Echo 03/2018: Study Conclusions  - Left ventricle: The cavity size was normal. Wall thickness was   normal. Systolic function was normal. The estimated ejection   fraction was in the range of 55% to 60%. Wall motion was normal;   there were no regional wall motion abnormalities. Doppler   parameters are consistent with abnormal left  ventricular   relaxation (grade 1 diastolic dysfunction). - Aortic valve: A bioprosthesis was present. - Mitral valve: Calcified annulus. There was mild regurgitation. - Left atrium: The atrium was mildly dilated. - Right atrium: The atrium was mildly dilated. - Pulmonary arteries: PA peak pressure: 40 mm Hg (S).  Impressions:  - Normal LV systolic function; mild diastolic dysfunction; s/p AVR   with no AI; mild MR; mild biatrial enlargement.  LHC 09/2011: No CAD  Recent Labs: No results found for requested labs within last 8760 hours.  04/03/16: WBC 2.8, hemoglobin 12.9, hematocrit 38.1, platelets 125 08/11/16: Total cholesterol 145, triglycerides 107, HDL 35, LDL 89  Lipid Panel    Component Value Date/Time   CHOL 144 03/10/2018 0925   TRIG 78 03/10/2018 0925   HDL 40 03/10/2018 0925   CHOLHDL 3.6 03/10/2018 0925   CHOLHDL 5.4 (H) 12/25/2015 1011   VLDL 19 12/25/2015 1011   LDLCALC 88 03/10/2018 0925      Wt Readings from Last 3 Encounters:  06/17/19 177 lb 12.8 oz (80.6 kg)  10/29/18 191 lb 3.2 oz (86.7 kg)  04/26/18 188 lb 3.2 oz (85.4 kg)       ASSESSMENT AND PLAN:  # Bioprosthetic AVR # h/o MRSA endocarditis: Nicholas Walls is doing well.  There is no evidence of recurrent endocarditis.  He will need endocarditis prophylaxis for future dental procedures.  Continue aspirin 81mg  daily.  # Aortic aneurysm: Ascending aorta was 3.7 cm 12/2015 and increased to 3.9 cm on 11/2016.  On 03/2018 it was 3.5, though not much of the ascending aorta was visualized.  Stable 06/2019.  # Bradycardia: He is still mildly bradycardic.  He has some lightheadedness upon standing but overall as well.  We will not consider pacemaker at this time.  Continue to monitor symptoms.  # Hyperlipidemia: Nicholas Walls's statin was discontinued due to memory deficits.   He is doing well on Zetia.  He will have fasting lipids checked with his PCP soon.  I have asked him to have these sent to us.  #  Elevated BP: BP was initially elevated but normal on repeat.  We will continue to monitor.   Current medicines are reviewed at length with the patient today.  The patient does not have  concerns regarding medicines.  The following changes have been made:  no change  Labs/ tests ordered today include:   Orders Placed This Encounter  Procedures  . EKG 12-Lead     Disposition:   FU with Barb Shear C. Duke Salvia, MD, [facility] in 1 year.  APP in 6 months.   Signed, River Ambrosio C. Duke Salvia, MD, [facility]  06/17/2019 2:42 PM    [facility]

## 2019-07-06 ENCOUNTER — Telehealth: Payer: Self-pay | Admitting: *Deleted

## 2019-07-06 DIAGNOSIS — R42 Dizziness and giddiness: Secondary | ICD-10-CM

## 2019-07-06 NOTE — Telephone Encounter (Signed)
Patient needs lipid panel and cmet at PCP Dr Chauncey Reading. Left message for nurse to call back

## 2019-07-07 NOTE — Telephone Encounter (Signed)
Left message to call back  

## 2019-07-25 NOTE — Addendum Note (Signed)
Addended by: Alvina Filbert B on: 07/25/2019 06:26 PM   Modules accepted: Orders

## 2019-07-25 NOTE — Telephone Encounter (Addendum)
Patient seen by PCP today for dizziness. PCP suggested patient be seen for orthostatic hypotension and abnormal EKG. PCP also suggested carotid dopplers Patient will have copy of EKG at visit. Discussed with Dr Oval Linsey and ok to go ahead and order carotid. Scheduled for tomorrow at 11 am

## 2019-07-25 NOTE — Progress Notes (Addendum)
Cardiology Clinic Note   Patient Name: Nicholas Walls Date of Encounter: 07/26/2019  Primary Care Provider:  Lorelei Pont, DO Primary Cardiologist:  Chilton Si, MD  Patient Profile    [name] he-year-old male presents today for evaluation of his recent dizziness.  Past Medical History    Past Medical History:  Diagnosis Date  . Anemia   . Aortic stenosis, severe    s/p AVR with pericardial tissue valve Feb 2013 per Dr. Tyrone Sage  . Aortic valve defect   . Arthritis    "all over my body"  . Ascending aortic aneurysm ([facility]) 08/11/2016   3.7cm on echo 12/2015  . Bradycardia by electrocardiogram   . 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 Full dentures   . Heart murmur   . History of anemia of chronic disease   . History of hiatal hernia   . Hyperlipidemia   . Hypertension   . Hyponatremia    Mild hyponatremia, allowed to self-correct, asymptomatic  . OSA (obstructive sleep apnea)    "suppose to wear mask; can't" (09/28/2015)  . Osteoarthritis     End-stage osteoarthritis--left knee  . Renal infarct ([facility]) ~ 09/24/2015   Past Surgical History:  Procedure Laterality Date  . AORTIC VALVE REPLACEMENT  10/13/2011   Procedure: AORTIC VALVE REPLACEMENT (AVR);  Surgeon: [name], MD;  Location[facility] OR;  Service: Open Heart Surgery;  Laterality: N/A;  . APPENDECTOMY    . CARDIAC CATHETERIZATION  2013  . CARDIAC VALVE REPLACEMENT    . CARPAL TUNNEL RELEASE  06/01/2012   Procedure: CARPAL TUNNEL RELEASE;  Surgeon: Wyn Forster., MD;  Location: [facility];  Service: Orthopedics;  Laterality: Right;  . CHOLECYSTECTOMY OPEN    . JOINT REPLACEMENT    . POSTERIOR FUSION CERVICAL SPINE    . TEE WITHOUT CARDIOVERSION N/A 09/26/2015   Procedure: TRANSESOPHAGEAL ECHOCARDIOGRAM (TEE);  Surgeon: Chrystie Nose, MD;  Location[facility] ENDOSCOPY;  Service: Cardiovascular;  Laterality: N/A;  . TOTAL KNEE ARTHROPLASTY Bilateral   .  ULNAR NERVE TRANSPOSITION  06/01/2012   Procedure: ULNAR NERVE DECOMPRESSION/TRANSPOSITION;  Surgeon: Wyn Forster., MD;  Location: [facility];  Service: Orthopedics;  Laterality: Right;  right ulnar nerve decompression    Allergies  Allergies  Allergen Reactions  . Metoprolol     HR 47 on metoprolol 12.5 mg daily.  [name] Ultram [Tramadol Hcl]     "seeing things"    History of Present Illness    [name] has a past medical history of bioprosthetic aortic valve placement and 10/2011 with endocarditis that was treated medically, mild ascending aortic aneurysm, HLD, and obstructive sleep apnea.  He developed vegetation on his prosthetic aortic valve in January 2017.  His blood cultures were positive for MRSA.  He received IV antibiotic treatment for 6 weeks (vancomycin, rifampin and 2 weeks of gentamicin).  His antibiotics were discontinued and his PICC line was removed on 10/2015.  A follow-up echocardiogram on 12/2015 did not show any recurrent endocarditis but did show grade 1 DD.  Due to worsening memory his statin was discontinued and Zetia was started.  He has tolerated Zetia well and has not noticed any myalgias.  He was last seen by Dr. Duke Salvia on 06/17/2019.  During that time he was doing well.  He denied chest pain and shortness of breath.  He did indicate that at times he had lightheadedness with standing up and indicated  that the sensation would dissipate after a couple of seconds.  He denied syncope or presyncope.  He indicated that he had recently stopped working and was given with his tools.  He had been busy clearing out his shed.  His blood pressure and heart rate were monitored at home and well controlled at that time.  He presents the clinic today with his daughter and states he has had increased episodes of dizziness over the past month.  He notices the dizziness mainly in the mornings but states that it can last till about noon before resolving on their own.  He  states the symptoms are similar to when he had vertigo.  He has not noticed any nausea and has not had any vomiting.  He goes on to state that he does notice more dizziness when he rolls to his left side and had dizziness on his left side with his carotid US today.  Adding sports drinks to his fluid intake seem to help somewhat however, his symptoms have continued even with increased oral fluid intake.  On exam dizziness was not reproducible and no nystagmus was noted.  He has not had any syncope or collapse associated with his dizziness.  A carotid ultrasound done on today (07/26/2019) showed no evidence of thrombus/atherosclerotic plaque or stenosis in his right carotid artery.  His left ICA showed 1 to 39% stenosis.  He presented to his PCP this morning where an EKG was performed showing sinus rhythm with frequent ventricular premature complexes and CBC, CMP, thyroid panel, and magnesium were drawn.  We have requested these results.  Florinef was also ordered by PCP. I will  order a cardiac event monitor, and nuclear stress test.  He denies chest pain, shortness of breath, lower extremity edema, fatigue, palpitations, melena, hematuria, hemoptysis, diaphoresis, weakness, syncope, orthopnea, and PND.   Home Medications    Prior to Admission medications   Medication Sig Start Date End Date Taking? Authorizing Provider  acetaminophen (TYLENOL) 325 MG tablet Take 650 mg by mouth every 6 (six) hours as needed for moderate pain.    [provider]  aspirin EC 81 MG tablet Take 81 mg by mouth daily.    [provider]  ezetimibe (ZETIA) 10 MG tablet Take 1 tablet (10 mg total) by mouth daily. 09/23/18   Chilton Siandolph, Tiffany, MD  ferrous fumarate-iron polysaccharide complex (TANDEM) 162-115.2 MG CAPS capsule Take 1 capsule by mouth daily with breakfast.    [provider]  fish oil-omega-3 fatty acids 1000 MG capsule Take 3 g by mouth daily.     [provider]    Family  History    Family History  Problem Relation Age of Onset  . Throat cancer Mother   . Heart failure Father   . Lung cancer Father   . Hypertension Sister    He indicated that his mother is deceased. He indicated that his father is deceased. He indicated that the status of his sister is unknown.  Social History    Social History   Socioeconomic History  . Marital status: Married    Spouse name: Not on file  . Number of children: Not on file  . Years of education: Not on file  . Highest education level: Not on file  Occupational History  . Not on file  Social Needs  . Financial resource strain: Not on file  . Food insecurity    Worry: Not on file    Inability: Not on file  .  Transportation needs    Medical: Not on file    Non-medical: Not on file  Tobacco Use  . Smoking status: Former Smoker    Packs/day: 3.00    Years: 23.00    Pack years: 69.00    Types: Cigarettes    Quit date: 09/01/1981    Years since quitting: 37.9  . Smokeless tobacco: Never Used  Substance and Sexual Activity  . Alcohol use: No    Alcohol/week: 12.0 standard drinks    Types: 12 Cans of beer per week    Comment: "gave it up"  . Drug use: No  . Sexual activity: Not on file  Lifestyle  . Physical activity    Days per week: Not on file    Minutes per session: Not on file  . Stress: Not on file  Relationships  . Social Herbalist on phone: Not on file    Gets together: Not on file    Attends religious service: Not on file    Active member of club or organization: Not on file    Attends meetings of clubs or organizations: Not on file    Relationship status: Not on file  . Intimate partner violence    Fear of current or ex partner: Not on file    Emotionally abused: Not on file    Physically abused: Not on file    Forced sexual activity: Not on file  Other Topics Concern  . Not on file  Social History Narrative  . Not on file     Review of Systems    General:  No chills,  fever, night sweats or weight changes.  Cardiovascular:  No chest pain, dyspnea on exertion, edema, orthopnea, palpitations, paroxysmal nocturnal dyspnea. Dermatological: No rash, lesions/masses Respiratory: No cough, dyspnea Urologic: No hematuria, dysuria Abdominal:   No nausea, vomiting, diarrhea, bright red blood per rectum, melena, or hematemesis Neurologic:  No visual changes, wkns, changes in mental status. All other systems reviewed and are otherwise negative except as noted above.  Physical Exam    VS:  BP 131/69 (BP Location: Right Arm, Patient Position: Sitting)   Pulse 67   Ht 6\' 2"  (1.88 m)   Wt 181 lb 6.4 oz (82.3 kg)   BMI 23.29 kg/m  , BMI Body mass index is 23.29 kg/m. GEN: Well nourished, well developed, in no acute distress. HEENT: normal. Neck: Supple, no JVD, carotid bruits, or masses. Cardiac: RRR, no murmurs, rubs, or gallops. No clubbing, cyanosis, edema.  Radials/DP/PT 2+ and equal bilaterally.  Respiratory:  Respirations regular and unlabored, clear to auscultation bilaterally. GI: Soft, nontender, nondistended, BS + x 4. MS: no deformity or atrophy. Skin: warm and dry, no rash. Neuro:  Strength and sensation are intact. Psych: Normal affect.  Accessory Clinical Findings    ECG personally reviewed by me today-sinus rhythm with premature atrial complexes with aberrant conduction nonspecific ST abnormality 68 bpm- No acute changes  EKG 07/25/2019 sinus rhythm with frequent ventricular complexes possible left atrial enlargement 67 bpm  EKG 06/17/2019 Sinus bradycardia possible left atrial enlargement 55 bpm  Echocardiogram 06/17/2019 IMPRESSIONS    1. Left ventricular ejection fraction, by visual estimation, is 60 to 65%. The left ventricle has normal function. Normal left ventricular size. Left ventricular septal wall thickness was mildly increased. There is no left ventricular hypertrophy.  2. Elevated left ventricular end-diastolic pressure.  3.  Left ventricular diastolic Doppler parameters are consistent with pseudonormalization pattern of LV diastolic filling.  4. Global right ventricle has normal systolic function.The right ventricular size is mildly enlarged. No increase in right ventricular wall thickness.  5. Left atrial size was moderately dilated.  6. Right atrial size was normal.  7. Moderate calcification of the anterior mitral valve leaflet(s).  8. Moderate mitral annular calcification. Moderate thickening of the anterior mitral valve leaflet(s).No evidence of mitral valve regurgitation. No evidence of mitral stenosis.  9. The tricuspid valve is normal in structure. Tricuspid valve regurgitation is trivial. 10. S/P bioprosthetic AVR that is functioning normally. Aortic valve regurgitation was not visualized by color flow Doppler. Aortic valve mean gradient measures 9.0 mmHg. Aortic valve peak gradient measures 16.2 mmHg. 11. The pulmonic valve was normal in structure. Pulmonic valve regurgitation is not visualized by color flow Doppler. 12. The inferior vena cava is normal in size with greater than 50% respiratory variability, suggesting right atrial pressure of 3 mmHg.  Carotid ultrasound 07/26/2019  Summary:  Right Carotid: There was no evidence of thrombus, dissection, atherosclerotic         plaque or stenosis in the cervical carotid system.   Left Carotid: Velocities in the left ICA are consistent with a 1-39% stenosis.   Vertebrals: Left vertebral artery demonstrates antegrade flow.        Right vertebral artery demonstrates high resistant flow.  Subclavians: Normal flow hemodynamics were seen in bilateral subclavian        arteries.   Assessment & Plan   1.  Dizziness-has noticed increased dizziness over the past month.  Carotid ultrasound completed and showed mild left stenosis 1-39%. Order 48 hour Holter monitor Order nuclear stress test for increased PVCs Start Florinef 0.1 mg daily  CBC, CMP, thyroid panel, and magnesium drawn by PCP-waiting on lab results. Order BMP in 1 week  Bradycardia-EKG today shows sinus rhythm with premature atrial complexes with aberrant conduction nonspecific ST abnormality 68 bpm 48-hour Holter monitor-mainly for a.m. rhythm analysis  AVR-history of MRSA endocarditis.  Completed course of IV antibiotics and follow-up echocardiogram showed no endocarditis Will need SBE prophylaxis for future dental procedures Continue aspirin 81 mg tablet daily  Essential hypertension-BP today 131/69. Orthostatic hypotension noted today laying 130/65 with heart rate of 67, sitting 118/64 HR 64, Standing 101/64 HR 83. Increase p.o. hydration-okay to supplement with sports drinks Start Florinef 0.1 mg daily Use lower extremity support stockings Elevate extremities when not active Get up from laying/sitting position slowly  Hyperlipidemia-statin medication discontinued due to memory impairment. Continue Zetia Lipid panel drawn by PCP 07/26/2019-waiting for results Monitored by PCP   Disposition: Follow-up with Dr. Duke Salvia after stress test.   Thomasene Ripple. Avonelle Viveros NP-C      [facility] [address] Office [phone] Fax [phone]

## 2019-07-26 ENCOUNTER — Encounter: Payer: Self-pay | Admitting: General Practice

## 2019-07-26 ENCOUNTER — Ambulatory Visit (HOSPITAL_COMMUNITY)
Admission: RE | Admit: 2019-07-26 | Discharge: 2019-07-26 | Disposition: A | Discharge status: Home / Self Care | Payer: Medicare Other | Source: Ambulatory Visit | Attending: Cardiology | Admitting: Cardiology

## 2019-07-26 ENCOUNTER — Ambulatory Visit (INDEPENDENT_AMBULATORY_CARE_PROVIDER_SITE_OTHER): Payer: Medicare Other | Admitting: General Practice

## 2019-07-26 ENCOUNTER — Encounter: Payer: Self-pay | Admitting: Cardiovascular Disease

## 2019-07-26 ENCOUNTER — Other Ambulatory Visit: Payer: Self-pay

## 2019-07-26 VITALS — BP 131/69 | HR 67 | Ht 74.0 in | Wt 181.4 lb

## 2019-07-26 DIAGNOSIS — I493 Ventricular premature depolarization: Secondary | ICD-10-CM

## 2019-07-26 DIAGNOSIS — R42 Dizziness and giddiness: Secondary | ICD-10-CM | POA: Diagnosis present

## 2019-07-26 DIAGNOSIS — Z953 Presence of xenogenic heart valve: Secondary | ICD-10-CM

## 2019-07-26 DIAGNOSIS — E7800 Pure hypercholesterolemia, unspecified: Secondary | ICD-10-CM

## 2019-07-26 DIAGNOSIS — Z79899 Other long term (current) drug therapy: Secondary | ICD-10-CM

## 2019-07-26 MED ORDER — FLUDROCORTISONE ACETATE 0.1 MG PO TABS: 0.1000 mg | ORAL_TABLET | Freq: Every day | ORAL | Status: DC

## 2019-07-26 NOTE — Patient Instructions (Addendum)
Labwork: BMET IN 1 WEEK HERE IN OUR OFFICE AT [facility]    You will NOT need to fast   If you have labs (blood work) drawn today and your tests are completely normal, you will receive your results only by: [name] MyChart Message (if you have MyChart) OR . A paper copy in the mail If you have any lab test that is abnormal or we need to change your treatment, we will call you to review the results.  Testing/Procedures: Your physician has requested that you have a lexiscan myoview. A cardiac stress test is a cardiological test that measures the heart's ability to respond to external stress in a controlled clinical environment. The stress response is induced by intravenous pharmacological stimulation.  Your physician has recommended that you wear a 3 DAY holter monitor. Holter monitors are medical devices that record the heart's electrical activity. Doctors most often use these monitors to diagnose arrhythmias. Arrhythmias are problems with the speed or rhythm of the heartbeat. The monitor is a small, portable device. You can wear one while you do your normal daily activities. This is usually used to diagnose what is causing palpitations/syncope (passing out). This will be performed at our [facility] location - [address].  PLEASE PURCHASE AND WEAR COMPRESSION STOCKINGS DAILY AND OFF AT BEDTIME. Compression stockings are elastic socks that squeeze the legs. They help to increase blood flow to the legs and to decrease swelling in the legs from fluid retention, and reduce the chance of developing blood clots in the lower legs.   Special Instructions: CONTINUE INCREASED FLUID AND GATORAIDE  Follow-Up: AFTER TESTING In Person Skeet Latch, MD.    At [facility], you and your health needs are our priority.  As part of our continuing mission to provide you with exceptional heart care, we have created designated Provider Care Teams.  These Care Teams include your primary Cardiologist  (physician) and Advanced Practice Providers (APPs -  Physician Assistants and Nurse Practitioners) who all work together to provide you with the care you need, when you need it.  Thank you for choosing [facility] at [facility]!!

## 2019-08-01 ENCOUNTER — Telehealth: Payer: Self-pay | Admitting: *Deleted

## 2019-08-01 NOTE — Telephone Encounter (Signed)
3 day ZIO XT Long term holter monitor to be mailed to the patients home.  Instructions reviewed briefly as they are included in the monitor kit.

## 2019-08-02 ENCOUNTER — Telehealth (HOSPITAL_COMMUNITY): Payer: Self-pay

## 2019-08-02 NOTE — Telephone Encounter (Signed)
Encounter complete. 

## 2019-08-04 ENCOUNTER — Ambulatory Visit (HOSPITAL_COMMUNITY)
Admission: RE | Admit: 2019-08-04 | Discharge: 2019-08-04 | Disposition: A | Discharge status: Home / Self Care | Payer: Medicare Other | Source: Ambulatory Visit | Attending: Cardiology | Admitting: Cardiology

## 2019-08-04 ENCOUNTER — Other Ambulatory Visit: Payer: Self-pay

## 2019-08-04 DIAGNOSIS — I493 Ventricular premature depolarization: Secondary | ICD-10-CM | POA: Insufficient documentation

## 2019-08-04 DIAGNOSIS — R42 Dizziness and giddiness: Secondary | ICD-10-CM | POA: Diagnosis present

## 2019-08-04 LAB — MYOCARDIAL PERFUSION IMAGING
Peak HR: 103 {beats}/min
Rest HR: 86 {beats}/min
SDS: 0
SRS: 0
SSS: 0
TID: 1.17

## 2019-08-04 LAB — BASIC METABOLIC PANEL
BUN/Creatinine Ratio: 21 (ref 10–24)
BUN: 22 mg/dL (ref 8–27)
CO2: 27 mmol/L (ref 20–29)
Calcium: 9.2 mg/dL (ref 8.6–10.2)
Chloride: 105 mmol/L (ref 96–106)
Creatinine, Ser: 1.04 mg/dL (ref 0.76–1.27)
GFR calc Af Amer: 77 mL/min/{1.73_m2} (ref >59)
GFR calc non Af Amer: 67 mL/min/{1.73_m2} (ref >59)
Glucose: 79 mg/dL (ref 65–99)
Potassium: 4.8 mmol/L (ref 3.5–5.2)
Sodium: 145 mmol/L — ABNORMAL HIGH (ref 134–144)

## 2019-08-04 MED ORDER — TECHNETIUM TC 99M TETROFOSMIN IV KIT
32.0000 | PACK | Freq: Once | INTRAVENOUS | Status: AC | PRN
  Administered 2019-08-04: 32 via INTRAVENOUS
  Filled 2019-08-04: qty 32

## 2019-08-04 MED ORDER — TECHNETIUM TC 99M TETROFOSMIN IV KIT
10.7000 | PACK | Freq: Once | INTRAVENOUS | Status: AC | PRN
  Administered 2019-08-04: 10.7 via INTRAVENOUS
  Filled 2019-08-04: qty 11

## 2019-08-04 MED ORDER — REGADENOSON 0.4 MG/5ML IV SOLN
0.4000 mg | Freq: Once | INTRAVENOUS | Status: AC
  Administered 2019-08-04: 0.4 mg via INTRAVENOUS

## 2019-08-05 ENCOUNTER — Telehealth: Payer: Self-pay | Admitting: *Deleted

## 2019-08-05 DIAGNOSIS — I493 Ventricular premature depolarization: Secondary | ICD-10-CM

## 2019-08-05 DIAGNOSIS — Z953 Presence of xenogenic heart valve: Secondary | ICD-10-CM

## 2019-08-05 DIAGNOSIS — R42 Dizziness and giddiness: Secondary | ICD-10-CM

## 2019-08-05 NOTE — Telephone Encounter (Addendum)
  Patient has had a lot of dizziness today. Blood pressure 116/66 lying, 122/66 sitting, and 106/61 standing. Dr Oval Linsey reviewed patients lexiscan and given all the PVC's and slight change from previous would like to proceed with cath. She would also like to get a repeat limited Echo.   Echo scheduled 12/10 and will keep follow up 12/15 with Dr Oval Linsey  Cath scheduled 12/18, will review instructions at follow up visit

## 2019-08-11 ENCOUNTER — Encounter (INDEPENDENT_AMBULATORY_CARE_PROVIDER_SITE_OTHER): Payer: Self-pay

## 2019-08-11 ENCOUNTER — Ambulatory Visit (HOSPITAL_COMMUNITY): Payer: Medicare Other | Attending: Cardiology

## 2019-08-11 ENCOUNTER — Other Ambulatory Visit: Payer: Self-pay

## 2019-08-11 DIAGNOSIS — Z953 Presence of xenogenic heart valve: Secondary | ICD-10-CM | POA: Insufficient documentation

## 2019-08-11 DIAGNOSIS — R42 Dizziness and giddiness: Secondary | ICD-10-CM | POA: Diagnosis not present

## 2019-08-11 DIAGNOSIS — I493 Ventricular premature depolarization: Secondary | ICD-10-CM | POA: Insufficient documentation

## 2019-08-12 ENCOUNTER — Ambulatory Visit (INDEPENDENT_AMBULATORY_CARE_PROVIDER_SITE_OTHER): Payer: Medicare Other

## 2019-08-12 DIAGNOSIS — R42 Dizziness and giddiness: Secondary | ICD-10-CM

## 2019-08-12 DIAGNOSIS — I493 Ventricular premature depolarization: Secondary | ICD-10-CM | POA: Diagnosis not present

## 2019-08-16 ENCOUNTER — Ambulatory Visit: Payer: Medicare Other | Admitting: Cardiovascular Disease

## 2019-08-16 ENCOUNTER — Other Ambulatory Visit (HOSPITAL_COMMUNITY): Payer: Medicare Other

## 2019-08-16 ENCOUNTER — Ambulatory Visit (INDEPENDENT_AMBULATORY_CARE_PROVIDER_SITE_OTHER): Payer: Medicare Other | Admitting: Cardiovascular Disease

## 2019-08-16 ENCOUNTER — Encounter: Payer: Self-pay | Admitting: Cardiovascular Disease

## 2019-08-16 ENCOUNTER — Other Ambulatory Visit: Payer: Self-pay

## 2019-08-16 VITALS — BP 153/80 | HR 75 | Temp 97.0°F | Ht 73.0 in | Wt 182.2 lb

## 2019-08-16 DIAGNOSIS — R9430 Abnormal result of cardiovascular function study, unspecified: Secondary | ICD-10-CM | POA: Diagnosis not present

## 2019-08-16 DIAGNOSIS — I493 Ventricular premature depolarization: Secondary | ICD-10-CM | POA: Diagnosis not present

## 2019-08-16 DIAGNOSIS — R9439 Abnormal result of other cardiovascular function study: Secondary | ICD-10-CM | POA: Diagnosis not present

## 2019-08-16 DIAGNOSIS — R42 Dizziness and giddiness: Secondary | ICD-10-CM | POA: Diagnosis not present

## 2019-08-16 MED ORDER — METOPROLOL TARTRATE 50 MG PO TABS: ORAL_TABLET | ORAL | 0 refills | Status: DC

## 2019-08-16 NOTE — Patient Instructions (Addendum)
Medication Instructions:  Try Zyrtec and Flonase to see if this helps with sinus issues  TAKE METOPROLOL 50 MG 2 HOURS PRIOR TO CT   *If you need a refill on your cardiac medications before your next appointment, please call your pharmacy*  Lab Work: BMET  1 WEEK PRIOR TO CARDIAC CT  If you have labs (blood work) drawn today and your tests are completely normal, you will receive your results only by: [name] MyChart Message (if you have MyChart) OR . A paper copy in the mail If you have any lab test that is abnormal or we need to change your treatment, we will call you to review the results.  Testing/Procedures: Your physician has requested that you have cardiac CT. Cardiac computed tomography (CT) is a painless test that uses an x-ray machine to take clear, detailed pictures of your heart. For further information please visit [email or web address] Please follow instruction sheet as given. THE OFFICE WILL CALL YOU TO SCHEDULE WHEN INSURANCE HAS APPROVED   Follow-Up: At [facility], you and your health needs are our priority.  As part of our continuing mission to provide you with exceptional heart care, we have created designated Provider Care Teams.  These Care Teams include your primary Cardiologist (physician) and Advanced Practice Providers (APPs -  Physician Assistants and Nurse Practitioners) who all work together to provide you with the care you need, when you need it.  Your next appointment:   2 month(s)  The format for your next appointment:   In Person  Provider:   You may see Chilton Si, MD or one of the following Advanced Practice Providers on your designated Care Team:    [name], PA-C  [place], [place]  Edd Fabian, [place]   Other Instructions  Your cardiac CT will be scheduled at one of the below locations:   [facility] [address] [place], [place] [address] [phone]  OR  [facility] [address] [place], [place] [address] [phone]  If scheduled at [facility], please arrive at the [facility] main entrance of [facility] 30-45 minutes prior to test start time. Proceed to the [facility] Radiology Department (first floor) to check-in and test prep.  If scheduled at [facility], please arrive 15 mins early for check-in and test prep.  Please follow these instructions carefully (unless otherwise directed):  Hold all erectile dysfunction medications at least 3 days (72 hrs) prior to test.  On the Night Before the Test: . Be sure to Drink plenty of water. . Do not consume any caffeinated/decaffeinated beverages or chocolate 12 hours prior to your test. . Do not take any antihistamines 12 hours prior to your test. . If the patient has contrast allergy: ? Patient will need a prescription for Prednisone and very clear instructions (as follows): 1. Prednisone 50 mg - take 13 hours prior to test 2. Take another Prednisone 50 mg 7 hours prior to test 3. Take another Prednisone 50 mg 1 hour prior to test 4. Take Benadryl 50 mg 1 hour prior to test . Patient must complete all four doses of above prophylactic medications. . Patient will need a ride after test due to Benadryl.  On the Day of the Test: . Drink plenty of water. Do not drink any water within one hour of the test. . Do not eat any food 4 hours prior to the test. . You may take your regular  medications prior to the test.  . Take metoprolol (Lopressor) two hours prior to test. . HOLD Furosemide/Hydrochlorothiazide morning of the test. . FEMALES- please wear underwire-free bra if available  Do not give Lopressor to patients with an allergy to lopressor or anyone with asthma or active COPD symptoms (currently taking steroids).       After the Test: . Drink plenty of water. . After receiving IV contrast, you may experience a mild flushed feeling. This  is normal. . On occasion, you may experience a mild rash up to 24 hours after the test. This is not dangerous. If this occurs, you can take Benadryl 25 mg and increase your fluid intake. . If you experience trouble breathing, this can be serious. If it is severe call 911 IMMEDIATELY. If it is mild, please call our office. . If you take any of these medications: Glipizide/Metformin, Avandament, Glucavance, please do not take 48 hours after completing test unless otherwise instructed.   Once we have confirmed authorization from your insurance company, we will call you to set up a date and time for your test.   For non-scheduling related questions, please contact the cardiac imaging nurse navigator should you have any questions/concerns: Rockwell Alexandria, RN Navigator Cardiac Imaging [facility] [phone] Office    Cardiac CT Angiogram  A cardiac CT angiogram is a procedure to look at the heart and the area around the heart. It may be done to help find the cause of chest pains or other symptoms of heart disease. During this procedure, a large X-ray machine, called a CT scanner, takes detailed pictures of the heart and the surrounding area after a dye (contrast material) has been injected into blood vessels in the area. The procedure is also sometimes called a coronary CT angiogram, coronary artery scanning, or CTA. A cardiac CT angiogram allows the health care provider to see how well blood is flowing to and from the heart. The health care provider will be able to see if there are any problems, such as:  Blockage or narrowing of the coronary arteries in the heart.  Fluid around the heart.  Signs of weakness or disease in the muscles, valves, and tissues of the heart. Tell a health care provider about:  Any allergies you have. This is especially important if you have had a previous allergic reaction to contrast dye.  All medicines you are taking, including vitamins,  herbs, eye drops, creams, and over-the-counter medicines.  Any blood disorders you have.  Any surgeries you have had.  Any medical conditions you have.  Whether you are pregnant or may be pregnant.  Any anxiety disorders, chronic pain, or other conditions you have that may increase your stress or prevent you from lying still. What are the risks? Generally, this is a safe procedure. However, problems may occur, including:  Bleeding.  Infection.  Allergic reactions to medicines or dyes.  Damage to other structures or organs.  Kidney damage from the dye or contrast that is used.  Increased risk of cancer from radiation exposure. This risk is low. Talk with your health care provider about: ? The risks and benefits of testing. ? How you can receive the lowest dose of radiation. What happens before the procedure?  Wear comfortable clothing and remove any jewelry, glasses, dentures, and hearing aids.  Follow instructions from your health care provider about eating and drinking. This may include: ? For 12 hours before the test -- avoid caffeine. This includes tea,  coffee, soda, energy drinks, and diet pills. Drink plenty of water or other fluids that do not have caffeine in them. Being well-hydrated can prevent complications. ? For 4-6 hours before the test -- stop eating and drinking. The contrast dye can cause nausea, but this is less likely if your stomach is empty.  Ask your health care provider about changing or stopping your regular medicines. This is especially important if you are taking diabetes medicines, blood thinners, or medicines to treat erectile dysfunction. What happens during the procedure?  Hair on your chest may need to be removed so that small sticky patches called electrodes can be placed on your chest. These will transmit information that helps to monitor your heart during the test.  An IV tube will be inserted into one of your veins.  You might be given a  medicine to control your heart rate during the test. This will help to ensure that good images are obtained.  You will be asked to lie on an exam table. This table will slide in and out of the CT machine during the procedure.  Contrast dye will be injected into the IV tube. You might feel warm, or you may get a metallic taste in your mouth.  You will be given a medicine (nitroglycerin) to relax (dilate) the arteries in your heart.  The table that you are lying on will move into the CT machine tunnel for the scan.  The person running the machine will give you instructions while the scans are being done. You may be asked to: ? Keep your arms above your head. ? Hold your breath. ? Stay very still, even if the table is moving.  When the scanning is complete, you will be moved out of the machine.  The IV tube will be removed. The procedure may vary among health care providers and hospitals. What happens after the procedure?  You might feel warm, or you may get a metallic taste in your mouth from the contrast dye.  You may have a headache from the nitroglycerin.  After the procedure, drink water or other fluids to wash (flush) the contrast material out of your body.  Contact a health care provider if you have any symptoms of allergy to the contrast. These symptoms include: ? Shortness of breath. ? Rash or hives. ? A racing heartbeat.  Most people can return to their normal activities right after the procedure. Ask your health care provider what activities are safe for you.  It is up to you to get the results of your procedure. Ask your health care provider, or the department that is doing the procedure, when your results will be ready. Summary  A cardiac CT angiogram is a procedure to look at the heart and the area around the heart. It may be done to help find the cause of chest pains or other symptoms of heart disease.  During this procedure, a large X-ray machine, called a CT  scanner, takes detailed pictures of the heart and the surrounding area after a dye (contrast material) has been injected into blood vessels in the area.  Ask your health care provider about changing or stopping your regular medicines before the procedure. This is especially important if you are taking diabetes medicines, blood thinners, or medicines to treat erectile dysfunction.  After the procedure, drink water or other fluids to wash (flush) the contrast material out of your body. This information is not intended to replace advice given to you by your  health care provider. Make sure you discuss any questions you have with your health care provider. Document Released: 07/31/2008 Document Revised: 07/31/2017 Document Reviewed: 07/07/2016 Elsevier Patient Education  2020 [employer]

## 2019-08-16 NOTE — Progress Notes (Signed)
Cardiology Office Note   Date:  08/17/2019   ID:  ZAHI PLASKETT, DOB [birth year], MRN [id number]  PCP:  Emelda Fear, DO  Cardiologist:   Skeet Latch, MD  Infectious Disease: Bobby Rumpf, MD  No chief complaint on file.    History of Present Illness: Nicholas Walls is a 83 y.o. male status post bioprosthetic aortic valve replacement (10/2011) with endocarditis treated medically, mild ascending aorta aneurysm, hyperlipidemia, and OSA here with dizziness.   Nicholas Walls developed a vegetation on his prosthetic aortic valve 09/2015.  Blood cultures were positive for MRSA.  He was treated with 6 weeks of vancomycin, rifampin, and 2 weeks of gentamicin, ending on February 2017.   Antibiotics were discontinued and his PICC was removed on 11/06/15.  He had a follow up echo 12/2015 that did not reveal any recurrent endocarditis but did show grade 1 diastolic dysfunction. Nicholas Walls has been on Zetia given that he had worsened memory deficits on statins. He has tolerated this well and has no myalgias.  At his last appointment 06/2019 Nicholas Walls noted that sometimes when he stands up he gets a little lightheaded.  He denies any syncope or presyncope.  It gets better within a couple seconds.  Since his last appointment the symptoms worsened.  He has dizziness lying down, turning in bed, turning his neck, and getting up from sitting.   The symptoms seem to be worse in the AM and slowly improve by afternoon  He checked his BP at home and was orthostatic .  Nicholas Walls saw Coletta Walls 07/2019 and was referred for Carotid Dopplers 07/2019 that reveled mild L ICA stenosis and none on the right.  EKG with his PCP and with Denyse Amass showed frequent PVCs.  He is asymptomatic.  A monitor was ordered but has not yet been completed. Echo 06/2019 revealed LVEF 60-65% with moderate MAC.  His aortic valve was functioning properly, mean gradient 9 mmHg.  Lexiscan Myoview  08/04/19 was concerning for infarct in  the basal to mid inferior region.  He was started on florinef and has noted some improvement in his symptoms, especially in the last five days.  He has also been drinking Gatorade.  His appetite has been good and he is eating well.  He was also started on meclizine which has helped.  The florinef was reduced due to hypernatremia.  He has no chest pain and his breathing has been stable.  He denies lower extremity edema, orthopnea or PND.  He is accompanied by his daughter, who notes that he has not been very physically active lately.  Home orthostatics: 143/76, 79 117/66, 82 91/56, 94   Past Medical History:  Diagnosis Date  . Abnormal nuclear stress test 08/17/2019  . Anemia   . Aortic stenosis, severe    s/p AVR with pericardial tissue valve Feb 2013 per Dr. Servando Snare  . Aortic valve defect   . Arthritis    "all over my body"  . Ascending aortic aneurysm ([place]) 08/11/2016   3.7cm on echo 12/2015  . Bradycardia by electrocardiogram   . 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 Full dentures   . Heart murmur   . History of anemia of chronic disease   . History of hiatal hernia   . Hyperlipidemia   . Hypertension   . Hyponatremia    Mild hyponatremia, allowed to self-correct, asymptomatic  . OSA (obstructive sleep apnea)    "suppose to wear mask;  can't" (09/28/2015)  . Osteoarthritis     End-stage osteoarthritis--left knee  . PVC (premature ventricular contraction) 08/17/2019  . Renal infarct ([place]) ~ 09/24/2015    Past Surgical History:  Procedure Laterality Date  . AORTIC VALVE REPLACEMENT  10/13/2011   Procedure: AORTIC VALVE REPLACEMENT (AVR);  Surgeon: Grace Isaac, MD;  Location: [facility];  Service: Open Heart Surgery;  Laterality: N/A;  . APPENDECTOMY    . CARDIAC CATHETERIZATION  2013  . CARDIAC VALVE REPLACEMENT    . CARPAL TUNNEL RELEASE  06/01/2012   Procedure: CARPAL TUNNEL RELEASE;  Surgeon: Cammie Sickle., MD;  Location: [facility];  Service: Orthopedics;  Laterality: Right;  . CHOLECYSTECTOMY OPEN    . JOINT REPLACEMENT    . POSTERIOR FUSION CERVICAL SPINE    . TEE WITHOUT CARDIOVERSION N/A 09/26/2015   Procedure: TRANSESOPHAGEAL ECHOCARDIOGRAM (TEE);  Surgeon: Pixie Casino, MD;  Location: [facility] ENDOSCOPY;  Service: Cardiovascular;  Laterality: N/A;  . TOTAL KNEE ARTHROPLASTY Bilateral   . ULNAR NERVE TRANSPOSITION  06/01/2012   Procedure: ULNAR NERVE DECOMPRESSION/TRANSPOSITION;  Surgeon: Cammie Sickle., MD;  Location: [facility];  Service: Orthopedics;  Laterality: Right;  right ulnar nerve decompression     Current Outpatient Medications  Medication Sig Dispense Refill  . acetaminophen (TYLENOL) 325 MG tablet Take 650 mg by mouth every 6 (six) hours as needed for moderate pain.    [name] aspirin EC 81 MG tablet Take 81 mg by mouth daily.    [name] ezetimibe (ZETIA) 10 MG tablet Take 1 tablet (10 mg total) by mouth daily. 90 tablet 3  . FERROUS SULFATE-VITAMIN C PO Take 1 tablet by mouth daily.    . Walls (FLORINEF) 0.1 MG tablet Take 1 tablet (0.1 mg total) by mouth daily. (Patient taking differently: Take 0.05 mg by mouth daily. )    . hydroxypropyl methylcellulose / hypromellose (ISOPTO TEARS / GONIOVISC) 2.5 % ophthalmic solution Place 1 drop into both eyes 2 (two) times daily as needed for dry eyes.    . meclizine (ANTIVERT) 25 MG tablet Take 25 mg by mouth at bedtime.    . metoprolol tartrate (LOPRESSOR) 50 MG tablet TAKE 1 TABLET 2 HOURS PRIOR TO CT 1 tablet 0   No current facility-administered medications for this visit.    Allergies:   Metoprolol and Ultram [tramadol hcl]    Social History:  The patient  reports that he quit smoking about 37 years ago. His smoking use included cigarettes. He has a 69.00 pack-year smoking history. He has never used smokeless tobacco. He reports that he does not drink alcohol or use drugs.   Family History:  [name] history includes  Heart failure in his father; Hypertension in his sister; Lung cancer in his father; Throat cancer in his mother.    ROS:  Please see the history of present illness.   Otherwise, review of systems are positive for none.   All other systems are reviewed and negative.    PHYSICAL EXAM: VS:  BP (!) 153/80   Pulse 75   Temp (!) 97 F (36.1 C)   Ht _0  (1.854 m)   Wt 182 lb 3.2 oz (82.6 kg)   SpO2 96%   BMI 24.04 kg/m  , BMI Body mass index is 24.04 kg/m. GENERAL:  Well appearing HEENT: Pupils equal round and reactive, fundi not visualized, oral mucosa unremarkable NECK:  No jugular venous distention, waveform within normal limits, carotid  upstroke brisk and symmetric, no bruits LUNGS:  Clear to auscultation bilaterally HEART:  RRR.  PMI not displaced or sustained,S1 and S2 within normal limits, no S3, no S4, no clicks, no rubs, II/VI systolic murmur at the LUSB.   ABD:  Flat, positive bowel sounds normal in frequency in pitch, no bruits, no rebound, no guarding, no midline pulsatile mass, no hepatomegaly, no splenomegaly EXT:  2 plus pulses throughout, no edema, no cyanosis no clubbing SKIN:  No rashes no nodules NEURO:  Cranial nerves II through XII grossly intact, motor grossly intact throughout PSYCH:  Cognitively intact, oriented to person place and time  EKG:  EKG is ordered today.   03/27/16: sinus bradycardia rate 47 bpm. 04/11/16: Sinus rhythm rate 60 bpm. 03/02/17: Sinus bradycardia.  Rate 54 bpm.  PAC.  04/26/18: Sinus rhythm.  Rate 60 bpm.  One PVC.  06/17/2019: Sinus bradycardia.  Rate 55 bpm. 08/16/19: Sinus rhythm.  Rate 75 bpm.  Echo 12/25/15: Study Conclusions  - Left ventricle: The cavity size was normal. Wall thickness was   increased in a pattern of mild LVH. Systolic function was normal.   The estimated ejection fraction was in the range of 55% to 60%.   Wall motion was normal; there were no regional wall motion   abnormalities. Doppler parameters are consistent  with abnormal   left ventricular relaxation (grade 1 diastolic dysfunction). - Aortic valve: There was a bioprosthetic aortic valve. No residual   vegetation noted. Bioprosthetic valve appears to function   normally. There was no significant regurgitation. Mean gradient   (S): 15 mm Hg. - Aorta: Borderline dilated aortic root. Aortic root dimension: 37   mm (ED). - Mitral valve: Mildly calcified annulus. Mildly calcified leaflets   . There was mild regurgitation. - Left atrium: The atrium was mildly dilated. - Right ventricle: The cavity size was normal. Systolic function   was normal. - Right atrium: The atrium was mildly dilated. - Tricuspid valve: Peak RV-RA gradient (S): 24 mm Hg. - Pulmonary arteries: PA peak pressure: 32 mm Hg (S). - Systemic veins: IVC measured 2.1 cm with > 50% respirophasic   variation, suggesting RA pressure 8 mmHg.  Impressions:  - Normal LV size with mild LV hypertrophy. EF 55-60%. Normal RV   size and systolic function. Bioprosthetic aortic valve appeared   to function normally. No evidence for residual endocarditis on   this study.   Echo 03/2018: Study Conclusions  - Left ventricle: The cavity size was normal. Wall thickness was   normal. Systolic function was normal. The estimated ejection   fraction was in the range of 55% to 60%. Wall motion was normal;   there were no regional wall motion abnormalities. Doppler   parameters are consistent with abnormal left ventricular   relaxation (grade 1 diastolic dysfunction). - Aortic valve: A bioprosthesis was present. - Mitral valve: Calcified annulus. There was mild regurgitation. - Left atrium: The atrium was mildly dilated. - Right atrium: The atrium was mildly dilated. - Pulmonary arteries: PA peak pressure: 40 mm Hg (S).  Impressions:  - Normal LV systolic function; mild diastolic dysfunction; s/p AVR   with no AI; mild MR; mild biatrial enlargement.  LHC 09/2011: No CAD  Recent  Labs: 08/04/2019: BUN 22; Creatinine, Ser 1.04; Potassium 4.8; Sodium 145  04/03/16: WBC 2.8, hemoglobin 12.9, hematocrit 38.1, platelets 125 08/11/16: Total cholesterol 145, triglycerides 107, HDL 35, LDL 89  Lipid Panel    Component Value Date/Time   CHOL 144  03/10/2018 0925   TRIG 78 03/10/2018 0925   HDL 40 03/10/2018 0925   CHOLHDL 3.6 03/10/2018 0925   CHOLHDL 5.4 (H) 12/25/2015 1011   VLDL 19 12/25/2015 1011   LDLCALC 88 03/10/2018 0925      Wt Readings from Last 3 Encounters:  08/16/19 182 lb 3.2 oz (82.6 kg)  08/04/19 182 lb (82.6 kg)  07/26/19 181 lb 6.4 oz (82.3 kg)       ASSESSMENT AND PLAN:  # Dizziness:  This seems to be due to both BPPV and orthostatic hypotension.  He has been profoundly orthostatic at home and symptoms are improving with florinef.  He was very hypertensive today in clinic but this has not been the case at home.  He will continue to monitor.  Continue florinef, hydration, increased salt and compression socks.   # PVCs: # Abnormal stress:  Nicholas Walls has frequent PVCs that are asymptomatic but new.  LVEF is preserved on echo.  However his stress was concerning for infarct.  Either infarct or ischemia could be causing PVCs. He had no CAD at the time of his cath in 2013.  We will get a coronary CT-A to assess for coronary disease.  # OSA:  Declines CPAP use.  This could contribute to fatigue during the day and PVCs.  He doesn't think he can tolerate CPAP.  # Bioprosthetic AVR # h/o MRSA endocarditis: Nicholas Walls is doing well.  There is no evidence of recurrent endocarditis.  He will need endocarditis prophylaxis for future dental procedures.  Continue aspirin 62m daily.  # Aortic aneurysm: Ascending aorta was 3.7 cm 12/2015 and increased to 3.9 cm on 11/2016.  On 03/2018 it was 3.5, though not much of the ascending aorta was visualized.  Stable 06/2019.  # Bradycardia: Avoid nodal agents.  OK for cardiac CT.  # Hyperlipidemia: Mr. BMassie statin was discontinued due to memory deficits.   He is doing well on Zetia.  [place]    Current medicines are reviewed at length with the patient today.  The patient does not have concerns regarding medicines.  The following changes have been made:  no change  Labs/ tests ordered today include:   Orders Placed This Encounter  Procedures  . CT CORONARY MORPH W/CTA COR W/SCORE W/CA W/CM &/OR WO/CM  . CT CORONARY FRACTIONAL FLOW RESERVE DATA PREP  . CT CORONARY FRACTIONAL FLOW RESERVE FLUID ANALYSIS  . Basic metabolic panel  . EKG 12-Lead     Disposition:   FU with Rethel Sebek C. ROval Linsey MD, [facility]in 1 year.  APP in 6 months.   Signed, Tiari Andringa C. ROval Linsey MD, FThe [facility] 08/17/2019 4:27 PM    [place]

## 2019-08-17 ENCOUNTER — Encounter: Payer: Self-pay | Admitting: Cardiovascular Disease

## 2019-08-17 DIAGNOSIS — I493 Ventricular premature depolarization: Secondary | ICD-10-CM

## 2019-08-17 DIAGNOSIS — R9439 Abnormal result of other cardiovascular function study: Secondary | ICD-10-CM

## 2019-08-17 HISTORY — DX: Ventricular premature depolarization: I49.3

## 2019-08-17 HISTORY — DX: Abnormal result of other cardiovascular function study: R94.39

## 2019-08-19 ENCOUNTER — Ambulatory Visit (HOSPITAL_COMMUNITY): Admit: 2019-08-19 | Payer: Medicare Other | Admitting: Cardiovascular Disease

## 2019-08-19 ENCOUNTER — Encounter (HOSPITAL_COMMUNITY): Payer: Self-pay

## 2019-08-19 SURGERY — LEFT HEART CATH AND CORONARY ANGIOGRAPHY
Anesthesia: LOCAL

## 2019-09-27 ENCOUNTER — Encounter: Payer: Self-pay | Admitting: *Deleted

## 2019-09-27 ENCOUNTER — Telehealth: Payer: Self-pay | Admitting: *Deleted

## 2019-09-27 DIAGNOSIS — I493 Ventricular premature depolarization: Secondary | ICD-10-CM

## 2019-09-27 MED ORDER — FLUDROCORTISONE ACETATE 0.1 MG PO TABS: ORAL_TABLET | ORAL | 1 refills | Status: DC

## 2019-09-27 NOTE — Telephone Encounter (Signed)
Patient aware of results  Will arrange EP consult

## 2019-09-27 NOTE — Telephone Encounter (Signed)
-----   Message from Chilton Si, MD sent at 09/23/2019  5:14 PM EST ----- Monitor showed that he is having frequent PVCs.  13%.  Recommend that he be seen by EP after his cardiac CT.

## 2019-09-30 ENCOUNTER — Other Ambulatory Visit: Payer: Self-pay

## 2019-09-30 ENCOUNTER — Telehealth (INDEPENDENT_AMBULATORY_CARE_PROVIDER_SITE_OTHER): Payer: Medicare Other | Admitting: Cardiovascular Disease

## 2019-09-30 ENCOUNTER — Ambulatory Visit (HOSPITAL_COMMUNITY)
Admission: RE | Admit: 2019-09-30 | Discharge: 2019-09-30 | Disposition: A | Discharge status: Home / Self Care | Payer: Medicare Other | Source: Ambulatory Visit | Attending: Cardiovascular Disease | Admitting: Cardiovascular Disease

## 2019-09-30 ENCOUNTER — Encounter: Payer: Self-pay | Admitting: *Deleted

## 2019-09-30 VITALS — Wt 182.0 lb

## 2019-09-30 DIAGNOSIS — R42 Dizziness and giddiness: Secondary | ICD-10-CM

## 2019-09-30 DIAGNOSIS — R9430 Abnormal result of cardiovascular function study, unspecified: Secondary | ICD-10-CM

## 2019-09-30 DIAGNOSIS — I493 Ventricular premature depolarization: Secondary | ICD-10-CM

## 2019-09-30 DIAGNOSIS — R9439 Abnormal result of other cardiovascular function study: Secondary | ICD-10-CM

## 2019-09-30 MED ORDER — NITROGLYCERIN 0.4 MG SL SUBL: 0.8000 mg | SUBLINGUAL_TABLET | SUBLINGUAL | Status: DC | PRN

## 2019-09-30 NOTE — Progress Notes (Signed)
Pt is here for Ct coronary exam. Upon placing pt on monitor pt is noted to have frequent PAC's. HR 57, BP 116/76. Pt does not report pain, SOB or dizziness at this time. No complaints at this time. Call to Dr Jacques Navy, she requests that we place pt on CT monitor and see what his HR looks like there. CT is aware. They will call when the CT 1 is available. Pt is aware and understands plan

## 2019-09-30 NOTE — Progress Notes (Signed)
Virtual Visit via Telephone Note   This visit type was conducted due to national recommendations for restrictions regarding the COVID-19 Pandemic (e.g. social distancing) in an effort to limit this patient's exposure and mitigate transmission in our community.  Due to his co-morbid illnesses, this patient is at least at moderate risk for complications without adequate follow up.  This format is felt to be most appropriate for this patient at this time.  The patient did not have access to video technology/had technical difficulties with video requiring transitioning to audio format only (telephone).  All issues noted in this document were discussed and addressed.  No physical exam could be performed with this format.  Please refer to the patient's chart for his  consent to telehealth for [facility].   Date:  09/30/2019   ID:  Nicholas Walls, DOB [birth year], MRN [id number]  Patient Location: Home Provider Location: Office  PCP:  Nicholas Fear, DO  Cardiologist:  Nicholas Latch, MD  Electrophysiologist:  None   Evaluation Performed:  Follow-Up Visit  Chief Complaint:  PVCs  History of Present Illness:    Nicholas Walls is a 84 y.o. male status post bioprosthetic aortic valve replacement (10/2011) with endocarditis treated medically, mild ascending aorta aneurysm, hyperlipidemia, and OSA here for follow up.  Nicholas Walls developed a vegetation on his prosthetic aortic valve 09/2015.  Blood cultures were positive for MRSA.  He was treated with 6 weeks of vancomycin, rifampin, and 2 weeks of gentamicin, ending on February 2017.   Antibiotics were discontinued and his PICC was removed on 11/06/15.  He had a follow up echo 12/2015 that did not reveal any recurrent endocarditis but did show grade 1 diastolic dysfunction. Nicholas Walls has been on Zetia given that he had worsened memory deficits on statins. He has tolerated this well and has no myalgias.  At his appointment 06/2019 Nicholas Walls  noted that sometimes when he stands up he gets a little lightheaded.  He denied any syncope or presyncope. He has dizziness lying down, turning in bed, turning his neck, and getting up from sitting.   The symptoms seemed to be worse in the AM and slowly improve by afternoon  He checked his BP at home and was orthostatic .  Nicholas Walls saw Nicholas Walls 07/2019 and was referred for Carotid Dopplers 07/2019 that reveled mild L ICA stenosis and none on the right.  EKG with his PCP and with Nicholas Walls showed frequent PVCs.  He is asymptomatic.  A monitor was ordered but has not yet been completed. Echo 06/2019 revealed LVEF 60-65% with moderate MAC.  His aortic valve was functioning properly, mean gradient 9 mmHg.  Lexiscan Myoview  08/04/19 was concerning for infarct in the basal to mid inferior region.  He was started on florinef and noted some improvement in his symptoms.  He has also been drinking Gatorade.  His appetite has been good and he is eating well.  He was also started on meclizine which has helped.  The florinef was reduced due to hypernatremia.  He now takes a 1/2 tablet daily and is feeling much better.  He has no chest pain and his breathing is stable.  He has no lower extremity edema.  He wore an event monitor that showed 13% PVCs. He presented today for cardiac CT-A but it could not be completed due to frequent ectopy.  Home orthostatics: 143/76, 79 117/66, 82 91/56, 94    The patient does not have symptoms concerning for  COVID-19 infection (fever, chills, cough, or new shortness of breath).    Past Medical History:  Diagnosis Date  . Abnormal nuclear stress test 08/17/2019  . Anemia   . Aortic stenosis, severe    s/p AVR with pericardial tissue valve Feb 2013 per Dr. Servando Snare  . Aortic valve defect   . Arthritis    "all over my body"  . Ascending aortic aneurysm ([place]) 08/11/2016   3.7cm on echo 12/2015  . Bradycardia by electrocardiogram   . 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 Full dentures   . Heart murmur   . History of anemia of chronic disease   . History of hiatal hernia   . Hyperlipidemia   . Hypertension   . Hyponatremia    Mild hyponatremia, allowed to self-correct, asymptomatic  . OSA (obstructive sleep apnea)    "suppose to wear mask; can't" (09/28/2015)  . Osteoarthritis     End-stage osteoarthritis--left knee  . PVC (premature ventricular contraction) 08/17/2019  . Renal infarct ([place]) ~ 09/24/2015   Past Surgical History:  Procedure Laterality Date  . AORTIC VALVE REPLACEMENT  10/13/2011   Procedure: AORTIC VALVE REPLACEMENT (AVR);  Surgeon: Grace Isaac, MD;  Location: [place];  Service: Open Heart Surgery;  Laterality: N/A;  . APPENDECTOMY    . CARDIAC CATHETERIZATION  2013  . CARDIAC VALVE REPLACEMENT    . CARPAL TUNNEL RELEASE  06/01/2012   Procedure: CARPAL TUNNEL RELEASE;  Surgeon: Cammie Sickle., MD;  Location: [place];  Service: Orthopedics;  Laterality: Right;  . CHOLECYSTECTOMY OPEN    . JOINT REPLACEMENT    . POSTERIOR FUSION CERVICAL SPINE    . TEE WITHOUT CARDIOVERSION N/A 09/26/2015   Procedure: TRANSESOPHAGEAL ECHOCARDIOGRAM (TEE);  Surgeon: Pixie Casino, MD;  Location: [facility] ENDOSCOPY;  Service: Cardiovascular;  Laterality: N/A;  . TOTAL KNEE ARTHROPLASTY Bilateral   . ULNAR NERVE TRANSPOSITION  06/01/2012   Procedure: ULNAR NERVE DECOMPRESSION/TRANSPOSITION;  Surgeon: Cammie Sickle., MD;  Location: [place];  Service: Orthopedics;  Laterality: Right;  right ulnar nerve decompression     No outpatient medications have been marked as taking for the 09/30/19 encounter (Appointment) with Nicholas Latch, MD.     Allergies:   Metoprolol and Ultram [place] Mode hcl]   Social History   Tobacco Use  . Smoking status: Former Smoker    Packs/day: 3.00    Years: 23.00    Pack years: 69.00    Types: Cigarettes    Quit date: 09/01/1981    Years since quitting: 38.1  .  Smokeless tobacco: Never Used  Substance Use Topics  . Alcohol use: No    Alcohol/week: 12.0 standard drinks    Types: 12 Cans of beer per week    Comment: "gave it up"  . Drug use: No     Family Hx: [name] history includes Heart failure in his father; Hypertension in his sister; Lung cancer in his father; Throat cancer in his mother.  ROS:   Please see the history of present illness.     All other systems reviewed and are negative.   Prior CV studies:   The following studies were reviewed today:  3 Day Event Monitor 08/29/20:  Quality: Fair.  Baseline artifact. Predominant rhythm: sinus rhytm Average heart rate: 71 bpm Max heart rate: 112 bpm Min heart rate: 45 bpm Pauses >2.5 seconds: non  Frequent PVCs (13%)  5 beats of NSVT  Echo 08/11/19: IMPRESSIONS    1. Left ventricular ejection fraction, by visual estimation, is 55 to  60%. The left ventricle has normal function. There is mildly increased  left ventricular hypertrophy.  2. The left ventricle has no regional wall motion abnormalities.  3. Global right ventricle has normal systolic function.The right  ventricular size is mildly enlarged.  4. Left atrial size was normal.  5. Right atrial size was normal.  6. Moderate mitral annular calcification.  7. Moderate thickening of the mitral valve leaflet(s).  8. The mitral valve is degenerative. Mild mitral valve regurgitation.  9. The tricuspid valve is normal in structure. Tricuspid valve  regurgitation is trivial.  10. S/p bioprosthetic AVR, which is functioning normally. Mean gradient 10  mmHg. Aortic valve regurgitation is not visualized.  11. The pulmonic valve was not well visualized. Pulmonic valve  regurgitation is not visualized.  12. The tricuspid regurgitant velocity is 2.73 m/s, and with an assumed  right atrial pressure of 8 mmHg, the estimated right ventricular systolic  pressure is mildly elevated at 37.8 mmHg.  13. The  inferior vena cava is normal in size with <50% respiratory  variability, suggesting right atrial pressure of 8 mmHg.   Lexiscan Myoview 08/04/19:   There was no ST segment deviation noted during stress.  Defect 1: There is a medium defect of mild severity present in the basal inferior, mid inferior and apex location.  This is a low risk study.   Low risk stress nuclear study with small fixed inferior and apical defects possibly secondary to thinning.  There is no ischemia.  The study was not gated due to frequent PVCs.  Suggest echocardiogram to quantify LV function.  Labs/Other Tests and Data Reviewed:    EKG:  No ECG reviewed.  Recent Labs: 08/04/2019: BUN 22; Creatinine, Ser 1.04; Potassium 4.8; Sodium 145   Recent Lipid Panel Lab Results  Component Value Date/Time   CHOL 144 03/10/2018 09:25 AM   TRIG 78 03/10/2018 09:25 AM   HDL 40 03/10/2018 09:25 AM   CHOLHDL 3.6 03/10/2018 09:25 AM   CHOLHDL 5.4 (H) 12/25/2015 10:11 AM   LDLCALC 88 03/10/2018 09:25 AM    Wt Readings from Last 3 Encounters:  08/16/19 182 lb 3.2 oz (82.6 kg)  08/04/19 182 lb (82.6 kg)  07/26/19 181 lb 6.4 oz (82.3 kg)     Objective:    Vital Signs:  There were no vitals taken for this visit.   VITAL SIGNS:  reviewed GEN:  no acute distress RESPIRATORY:  respiratins unlabored NEURO:  alert and oriented x 3, no obvious focal deficit PSYCH:  normal affect  ASSESSMENT & PLAN:    # Dizziness:  This seems to be due to both BPPV and orthostatic hypotension.  Symptoms have improved with Florinef.  He is unsure of his recent blood pressures.  He is going to start tracking them.    # PVCs: # Abnormal stress:  Mr. Supinski has frequent PVCs (13%) that are asymptomatic but new.  LVEF is preserved on echo.  However his stress was concerning for infarct.  Either infarct or ischemia could be causing PVCs. He had no CAD at the time of his cath in 2013.    We were unable to get a coronary CT-a due to frequent  ectopy.  Therefore we are going to pursue cardiac catheterization.  He is amenable to this plan.  Risks and benefits of cardiac catheterization have been discussed with the patient.  The patient understands  that risks included but are not limited to stroke (1 in 1000), death (1 in 1), kidney failure [usually temporary] (1 in 500), bleeding (1 in 200), allergic reaction [possibly serious] (1 in 200). The patient understands and agrees to proceed.   # OSA:  Declines CPAP use.  This could contribute to fatigue during the day and PVCs.  He doesn't think he can tolerate CPAP.  # Bioprosthetic AVR # h/o MRSA endocarditis: Mr. Brandl is doing well.  There is no evidence of recurrent endocarditis.  He will need endocarditis prophylaxis for future dental procedures.  Continue aspirin 26m daily.  # Aortic aneurysm: Ascending aorta was 3.7 cm 12/2015 and increased to 3.9 cm on 11/2016.  On 03/2018 it was 3.5, though not much of the ascending aorta was visualized.  Stable 06/2019.  # Bradycardia: Avoid nodal agents.  OK for cardiac CT.  # Hyperlipidemia: Mr. BDanielstatin was discontinued due to memory deficits.   He is doing well on Zetia.  He [place] have his [place]  COVID-19 Education: The signs and symptoms of COVID-19 were discussed with the patient and how to seek care for testing (follow up with PCP or arrange E-visit).  The importance of social distancing was discussed today.  Time:   Today, I have spent 15 minutes with the patient with telehealth technology discussing the above problems.     Medication Adjustments/Labs and Tests Ordered: Current medicines are reviewed at length with the patient today.  Concerns regarding medicines are outlined above.   Tests Ordered: No orders of the defined types were placed in this encounter.   Medication Changes: No orders of the defined types were placed in this encounter.   Follow Up:  as scheduled in 5 month(s)   Signed, TSkeet Latch MD  09/30/2019 4:27 PM    [place]

## 2019-09-30 NOTE — Addendum Note (Signed)
Addended by: Chilton Si C on: 09/30/2019 04:36 PM   Modules accepted: Orders, SmartSet

## 2019-09-30 NOTE — Patient Instructions (Signed)
Medication Instructions:  Your physician recommends that you continue on your current medications as directed. Please refer to the Current Medication list given to you today.  *If you need a refill on your cardiac medications before your next appointment, please call your pharmacy*  Lab Work: BMET/CBC 1 WEEK PRIOR TO CATH AT YOUR PRIMARY CARE   COVID SCREENING 10/08/2019 AT 12:15 AT [facility]   If you have labs (blood work) drawn today and your tests are completely normal, you will receive your results only by: [name] MyChart Message (if you have MyChart) OR . A paper copy in the mail If you have any lab test that is abnormal or we need to change your treatment, we will call you to review the results.  Testing/Procedures: Your physician has requested that you have a cardiac catheterization. Cardiac catheterization is used to diagnose and/or treat various heart conditions. Doctors may recommend this procedure for a number of different reasons. The most common reason is to evaluate chest pain. Chest pain can be a symptom of coronary artery disease (CAD), and cardiac catheterization can show whether plaque is narrowing or blocking your heart's arteries. This procedure is also used to evaluate the valves, as well as measure the blood flow and oxygen levels in different parts of your heart. For further information please visit [email or web address] Please follow instruction sheet, as given.  Follow-Up: At [facility], you and your health needs are our priority.  As part of our continuing mission to provide you with exceptional heart care, we have created designated Provider Care Teams.  These Care Teams include your primary Cardiologist (physician) and Advanced Practice Providers (APPs -  Physician Assistants and Nurse Practitioners) who all work together to provide you with the care you need, when you need it.  Your next appointment:   KEEP FOLLOW UP AS SCHEDULED   Other  Instructions     [facility] CARDIOVASCULAR DIVISION [facility] [address] [place] [address] Dept: [phone] Loc: [phone]  Nicholas Walls  09/30/2019  You are scheduled for a Cardiac Catheterization on Wednesday, February 10 with Dr. Verne Carrow.  1. Please arrive at the [facility] (Main Entrance A) at [facility]: [address] [place], [place] [address] at 6:30 AM (This time is two hours before your procedure to ensure your preparation). Free valet parking service is available.   Special note: Every effort is made to have your procedure done on time. Please understand that emergencies sometimes delay scheduled procedures.  2. Diet: Do not eat solid foods after midnight.  The patient may have clear liquids until 5am upon the day of the procedure.  3. Labs: You will need to have blood drawn on 1 WEEK PRIOR AT YOUR PRIMARY CARE  4. Medication instructions in preparation for your procedure:   Contrast Allergy: No  On the morning of your procedure, take your Aspirin and any morning medicines NOT listed above.  You may use sips of water.  5. Plan for one night stay--bring personal belongings. 6. Bring a current list of your medications and current insurance cards. 7. You MUST have a responsible person to drive you home. 8. Someone MUST be with you the first 24 hours after you arrive home or your discharge will be delayed. 9. Please wear clothes that are easy to get on and off and wear slip-on shoes.  Thank you for allowing [place] to care for you!   -- [facility]  Invasive Cardiovascular services

## 2019-10-08 ENCOUNTER — Other Ambulatory Visit (HOSPITAL_COMMUNITY): Payer: Medicare Other

## 2019-10-10 ENCOUNTER — Encounter: Payer: Self-pay | Admitting: *Deleted

## 2019-10-10 MED ORDER — EZETIMIBE 10 MG PO TABS: 10.0000 mg | ORAL_TABLET | Freq: Every day | ORAL | 3 refills | Status: DC

## 2019-10-15 ENCOUNTER — Other Ambulatory Visit (HOSPITAL_COMMUNITY)
Admission: RE | Admit: 2019-10-15 | Discharge: 2019-10-15 | Disposition: A | Discharge status: Home / Self Care | Payer: Medicare Other | Source: Ambulatory Visit | Attending: Interventional Cardiology | Admitting: Interventional Cardiology

## 2019-10-15 DIAGNOSIS — Z20822 Contact with and (suspected) exposure to covid-19: Secondary | ICD-10-CM | POA: Insufficient documentation

## 2019-10-15 DIAGNOSIS — Z01812 Encounter for preprocedural laboratory examination: Secondary | ICD-10-CM | POA: Diagnosis present

## 2019-10-15 LAB — SARS CORONAVIRUS 2 (TAT 6-24 HRS): SARS Coronavirus 2: NEGATIVE

## 2019-10-18 ENCOUNTER — Telehealth: Payer: Self-pay | Admitting: *Deleted

## 2019-10-18 NOTE — Telephone Encounter (Signed)
I left voicemail message for pt's daughter (pt's verbal permission), Maxine Glenn, with procedure instructions and to call me if any questions.

## 2019-10-18 NOTE — Telephone Encounter (Signed)
Pt contacted pre-catheterization scheduled at [facility] for: Wednesday October 19, 2019 8:30 AM Verified arrival time and place: [facility] Main Entrance A [facility]) at: 6:30 AM   No solid food after midnight prior to cath, clear liquids until 5 AM day of procedure. Contrast allergy: no   AM meds can be  taken pre-cath with sip of water including: ASA 81 mg   Confirmed patient has responsible adult to drive home post procedure and observe 24 hours after arriving home: yes  Currently, due to Covid-19 pandemic, only one person will be allowed with patient. Must be the same person for patient's entire stay and will be required to wear a mask. They will be asked to wait in the waiting room for the duration of the patient's stay.  Patients are required to wear a mask when they enter the hospital.      COVID-19 Pre-Screening Questions:  . In the past 7 to 10 days have you had a cough,  shortness of breath, headache, congestion, fever (100 or greater) body aches, chills, sore throat, or sudden loss of taste or sense of smell? no . Have you been around anyone with known Covid 19 in the past 7-10 days? no . Have you been around anyone who is awaiting Covid 19 test results in the past 7 to 10 days? no . Have you been around anyone who has been exposed to Covid 19, or has mentioned symptoms of Covid 19 within the past 7 to 10 days? no

## 2019-10-19 ENCOUNTER — Ambulatory Visit (HOSPITAL_COMMUNITY)
Admission: RE | Admit: 2019-10-19 | Discharge: 2019-10-19 | Disposition: A | Discharge status: Home / Self Care | Payer: Medicare Other | Attending: Interventional Cardiology | Admitting: Interventional Cardiology

## 2019-10-19 ENCOUNTER — Encounter (HOSPITAL_COMMUNITY)
Admission: RE | Disposition: A | Discharge status: Home / Self Care | Payer: Medicare Other | Source: Home / Self Care | Attending: Interventional Cardiology

## 2019-10-19 ENCOUNTER — Other Ambulatory Visit: Payer: Self-pay

## 2019-10-19 DIAGNOSIS — I719 Aortic aneurysm of unspecified site, without rupture: Secondary | ICD-10-CM | POA: Diagnosis not present

## 2019-10-19 DIAGNOSIS — G4733 Obstructive sleep apnea (adult) (pediatric): Secondary | ICD-10-CM | POA: Diagnosis not present

## 2019-10-19 DIAGNOSIS — T826XXA Infection and inflammatory reaction due to cardiac valve prosthesis, initial encounter: Secondary | ICD-10-CM | POA: Diagnosis present

## 2019-10-19 DIAGNOSIS — R931 Abnormal findings on diagnostic imaging of heart and coronary circulation: Secondary | ICD-10-CM | POA: Insufficient documentation

## 2019-10-19 DIAGNOSIS — R001 Bradycardia, unspecified: Secondary | ICD-10-CM | POA: Diagnosis not present

## 2019-10-19 DIAGNOSIS — Z87891 Personal history of nicotine dependence: Secondary | ICD-10-CM | POA: Diagnosis not present

## 2019-10-19 DIAGNOSIS — I7121 Aneurysm of the ascending aorta, without rupture: Secondary | ICD-10-CM | POA: Diagnosis present

## 2019-10-19 DIAGNOSIS — G4730 Sleep apnea, unspecified: Secondary | ICD-10-CM | POA: Diagnosis present

## 2019-10-19 DIAGNOSIS — I350 Nonrheumatic aortic (valve) stenosis: Secondary | ICD-10-CM | POA: Insufficient documentation

## 2019-10-19 DIAGNOSIS — Z952 Presence of prosthetic heart valve: Secondary | ICD-10-CM | POA: Diagnosis not present

## 2019-10-19 DIAGNOSIS — E785 Hyperlipidemia, unspecified: Secondary | ICD-10-CM | POA: Insufficient documentation

## 2019-10-19 DIAGNOSIS — I10 Essential (primary) hypertension: Secondary | ICD-10-CM | POA: Insufficient documentation

## 2019-10-19 DIAGNOSIS — R42 Dizziness and giddiness: Secondary | ICD-10-CM | POA: Diagnosis not present

## 2019-10-19 DIAGNOSIS — I330 Acute and subacute infective endocarditis: Secondary | ICD-10-CM | POA: Diagnosis present

## 2019-10-19 DIAGNOSIS — Z953 Presence of xenogenic heart valve: Secondary | ICD-10-CM

## 2019-10-19 DIAGNOSIS — I38 Endocarditis, valve unspecified: Secondary | ICD-10-CM | POA: Diagnosis present

## 2019-10-19 DIAGNOSIS — R9439 Abnormal result of other cardiovascular function study: Secondary | ICD-10-CM

## 2019-10-19 DIAGNOSIS — I712 Thoracic aortic aneurysm, without rupture: Secondary | ICD-10-CM | POA: Diagnosis present

## 2019-10-19 HISTORY — PX: CORONARY ANGIOGRAPHY: CATH118303

## 2019-10-19 SURGERY — CORONARY ANGIOGRAPHY (CATH LAB)
Anesthesia: LOCAL

## 2019-10-19 MED ORDER — SODIUM CHLORIDE 0.9 % IV SOLN: 250.0000 mL | INTRAVENOUS | Status: DC | PRN

## 2019-10-19 MED ORDER — LIDOCAINE HCL (PF) 1 % IJ SOLN
INTRAMUSCULAR | Status: DC | PRN
  Administered 2019-10-19: 2 mL via INTRADERMAL

## 2019-10-19 MED ORDER — SODIUM CHLORIDE 0.9 % WEIGHT BASED INFUSION: 1.0000 mL/kg/h | INTRAVENOUS | Status: DC

## 2019-10-19 MED ORDER — OXYCODONE HCL 5 MG PO TABS: 5.0000 mg | ORAL_TABLET | ORAL | Status: DC | PRN

## 2019-10-19 MED ORDER — LIDOCAINE HCL (PF) 1 % IJ SOLN
INTRAMUSCULAR | Status: AC
  Filled 2019-10-19: qty 30

## 2019-10-19 MED ORDER — HEPARIN (PORCINE) IN NACL 1000-0.9 UT/500ML-% IV SOLN
INTRAVENOUS | Status: DC | PRN
  Administered 2019-10-19 (×2): 500 mL

## 2019-10-19 MED ORDER — SODIUM CHLORIDE 0.9% FLUSH: 3.0000 mL | INTRAVENOUS | Status: DC | PRN

## 2019-10-19 MED ORDER — HEPARIN SODIUM (PORCINE) 1000 UNIT/ML IJ SOLN
INTRAMUSCULAR | Status: DC | PRN
  Administered 2019-10-19: 4500 [IU] via INTRAVENOUS

## 2019-10-19 MED ORDER — HEPARIN SODIUM (PORCINE) 1000 UNIT/ML IJ SOLN
INTRAMUSCULAR | Status: AC
  Filled 2019-10-19: qty 1

## 2019-10-19 MED ORDER — FENTANYL CITRATE (PF) 100 MCG/2ML IJ SOLN
INTRAMUSCULAR | Status: AC
  Filled 2019-10-19: qty 2

## 2019-10-19 MED ORDER — IOHEXOL 350 MG/ML SOLN
INTRAVENOUS | Status: DC | PRN
  Administered 2019-10-19: 50 mL

## 2019-10-19 MED ORDER — VERAPAMIL HCL 2.5 MG/ML IV SOLN
INTRAVENOUS | Status: AC
  Filled 2019-10-19: qty 2

## 2019-10-19 MED ORDER — SODIUM CHLORIDE 0.9 % WEIGHT BASED INFUSION
3.0000 mL/kg/h | INTRAVENOUS | Status: AC
  Administered 2019-10-19: 3 mL/kg/h via INTRAVENOUS

## 2019-10-19 MED ORDER — HEPARIN (PORCINE) IN NACL 1000-0.9 UT/500ML-% IV SOLN
INTRAVENOUS | Status: AC
  Filled 2019-10-19: qty 1000

## 2019-10-19 MED ORDER — ASPIRIN 81 MG PO CHEW: 81.0000 mg | CHEWABLE_TABLET | ORAL | Status: DC

## 2019-10-19 MED ORDER — MIDAZOLAM HCL 2 MG/2ML IJ SOLN
INTRAMUSCULAR | Status: AC
  Filled 2019-10-19: qty 2

## 2019-10-19 MED ORDER — ONDANSETRON HCL 4 MG/2ML IJ SOLN: 4.0000 mg | Freq: Four times a day (QID) | INTRAMUSCULAR | Status: DC | PRN

## 2019-10-19 MED ORDER — SODIUM CHLORIDE 0.9 % IV SOLN: INTRAVENOUS | Status: DC

## 2019-10-19 MED ORDER — LABETALOL HCL 5 MG/ML IV SOLN: 10.0000 mg | INTRAVENOUS | Status: DC | PRN

## 2019-10-19 MED ORDER — HYDRALAZINE HCL 20 MG/ML IJ SOLN: 10.0000 mg | INTRAMUSCULAR | Status: DC | PRN

## 2019-10-19 MED ORDER — ACETAMINOPHEN 325 MG PO TABS: 650.0000 mg | ORAL_TABLET | ORAL | Status: DC | PRN

## 2019-10-19 MED ORDER — FENTANYL CITRATE (PF) 100 MCG/2ML IJ SOLN
INTRAMUSCULAR | Status: DC | PRN
  Administered 2019-10-19: 25 ug via INTRAVENOUS

## 2019-10-19 MED ORDER — SODIUM CHLORIDE 0.9% FLUSH: 3.0000 mL | Freq: Two times a day (BID) | INTRAVENOUS | Status: DC

## 2019-10-19 MED ORDER — VERAPAMIL HCL 2.5 MG/ML IV SOLN
INTRAVENOUS | Status: DC | PRN
  Administered 2019-10-19: 09:00:00 10 mL via INTRA_ARTERIAL

## 2019-10-19 MED ORDER — MIDAZOLAM HCL 2 MG/2ML IJ SOLN
INTRAMUSCULAR | Status: DC | PRN
  Administered 2019-10-19: 1 mg via INTRAVENOUS

## 2019-10-19 SURGICAL SUPPLY — 10 items
CATH 5FR JL3.5 JR4 ANG PIG MP (CATHETERS) ×1 IMPLANT
DEVICE RAD COMP TR BAND LRG (VASCULAR PRODUCTS) ×1 IMPLANT
GLIDESHEATH SLEND A-KIT 6F 22G (SHEATH) ×1 IMPLANT
GUIDEWIRE INQWIRE 1.5J.035X260 (WIRE) IMPLANT
INQWIRE 1.5J .035X260CM (WIRE) ×2
KIT HEART LEFT (KITS) ×2 IMPLANT
PACK CARDIAC CATHETERIZATION (CUSTOM PROCEDURE TRAY) ×2 IMPLANT
SHEATH PROBE COVER 6X72 (BAG) ×1 IMPLANT
TRANSDUCER W/STOPCOCK (MISCELLANEOUS) ×2 IMPLANT
TUBING CIL FLEX 10 FLL-RA (TUBING) ×2 IMPLANT

## 2019-10-19 NOTE — Discharge Instructions (Signed)
Radial Site Care  This sheet gives you information about how to care for yourself after your procedure. Your health care provider may also give you more specific instructions. If you have problems or questions, contact your health care provider. What can I expect after the procedure? After the procedure, it is common to have:  Bruising and tenderness at the catheter insertion area. Follow these instructions at home: Medicines  Take over-the-counter and prescription medicines only as told by your health care provider. Insertion site care  Follow instructions from your health care provider about how to take care of your insertion site. Make sure you: ? Wash your hands with soap and water before you change your bandage (dressing). If soap and water are not available, use hand sanitizer. ? Change your dressing as told by your health care provider. ? Leave stitches (sutures), skin glue, or adhesive strips in place. These skin closures may need to stay in place for 2 weeks or longer. If adhesive strip edges start to loosen and curl up, you may trim the loose edges. Do not remove adhesive strips completely unless your health care provider tells you to do that.  Check your insertion site every day for signs of infection. Check for: ? Redness, swelling, or pain. ? Fluid or blood. ? Pus or a bad smell. ? Warmth.  Do not take baths, swim, or use a hot tub until your health care provider approves.  You may shower 24-48 hours after the procedure, or as directed by your health care provider. ? Remove the dressing and gently wash the site with plain soap and water. ? Pat the area dry with a clean towel. ? Do not rub the site. That could cause bleeding.  Do not apply powder or lotion to the site. Activity   For 24 hours after the procedure, or as directed by your health care provider: ? Do not flex or bend the affected arm. ? Do not push or pull heavy objects with the affected arm. ? Do not  drive yourself home from the hospital or clinic. You may drive 24 hours after the procedure unless your health care provider tells you not to. ? Do not operate machinery or power tools.  Do not lift anything that is heavier than 10 lb (4.5 kg), or the limit that you are told, until your health care provider says that it is safe.  Ask your health care provider when it is okay to: ? Return to work or school. ? Resume usual physical activities or sports. ? Resume sexual activity. General instructions  If the catheter site starts to bleed, raise your arm and put firm pressure on the site. If the bleeding does not stop, get help right away. This is a medical emergency.  If you went home on the same day as your procedure, a responsible adult should be with you for the first 24 hours after you arrive home.  Keep all follow-up visits as told by your health care provider. This is important. Contact a health care provider if:  You have a fever.  You have redness, swelling, or yellow drainage around your insertion site. Get help right away if:  You have unusual pain at the radial site.  The catheter insertion area swells very fast.  The insertion area is bleeding, and the bleeding does not stop when you hold steady pressure on the area.  Your arm or hand becomes pale, cool, tingly, or numb. These symptoms may represent a serious problem   that is an emergency. Do not wait to see if the symptoms will go away. Get medical help right away. Call your local emergency services (911 in the [place] Do not drive yourself to the hospital. Summary  After the procedure, it is common to have bruising and tenderness at the site.  Follow instructions from your health care provider about how to take care of your radial site wound. Check the wound every day for signs of infection.  Do not lift anything that is heavier than 10 lb (4.5 kg), or the limit that you are told, until your health care provider says  that it is safe. This information is not intended to replace advice given to you by your health care provider. Make sure you discuss any questions you have with your health care provider. Document Revised: 09/23/2017 Document Reviewed: 09/23/2017 Elsevier Patient Education  2020 [employer]  

## 2019-10-26 ENCOUNTER — Ambulatory Visit: Payer: Medicare Other | Admitting: Cardiovascular Disease

## 2019-11-02 NOTE — Telephone Encounter (Signed)
Patient scheduled for 3/10

## 2019-11-09 ENCOUNTER — Other Ambulatory Visit: Payer: Self-pay

## 2019-11-09 ENCOUNTER — Encounter: Payer: Self-pay | Admitting: Internal Medicine

## 2019-11-09 ENCOUNTER — Ambulatory Visit (INDEPENDENT_AMBULATORY_CARE_PROVIDER_SITE_OTHER): Payer: Medicare Other | Admitting: Internal Medicine

## 2019-11-09 VITALS — BP 130/82 | HR 82 | Ht 74.0 in | Wt 191.2 lb

## 2019-11-09 DIAGNOSIS — I959 Hypotension, unspecified: Secondary | ICD-10-CM | POA: Diagnosis not present

## 2019-11-09 DIAGNOSIS — I493 Ventricular premature depolarization: Secondary | ICD-10-CM

## 2019-11-09 NOTE — Patient Instructions (Addendum)
Medication Instructions:  Your physician recommends that you continue on your current medications as directed. Please refer to the Current Medication list given to you today.  Labwork: None ordered.  Testing/Procedures: None ordered.  Follow-Up: Your physician wants you to follow-up in: as needed with Dr. Taylor.      Any Other Special Instructions Will Be Listed Below (If Applicable).  If you need a refill on your cardiac medications before your next appointment, please call your pharmacy.   

## 2019-11-09 NOTE — Progress Notes (Signed)
HPI Nicholas Walls is referred today by Dr. Oval Linsey for evaluatio of PVC's. He has a h/o AS and is s/p SAVR. He has become sedentary over the past couple of years. His daughter who is with him notes that he sits in his chair most days. He does not have palpitations. He wore a cardiac monitor which demonstrated 13% PVC's. He has had some problems with hypotension and has been taking florinef. He was on lopressor but this was stopped due to his hypotension. He has no realization of his ventricular ectopy.   Allergies  Allergen Reactions  . Metoprolol     HR 47 on metoprolol 12.5 mg daily.  [name] Ultram [Tramadol Hcl]     "seeing things"     Current Outpatient Medications  Medication Sig Dispense Refill  . acetaminophen (TYLENOL) 325 MG tablet Take 650 mg by mouth every 6 (six) hours as needed for moderate pain.    [name] aspirin EC 81 MG tablet Take 81 mg by mouth daily.    [name] ezetimibe (ZETIA) 10 MG tablet Take 1 tablet (10 mg total) by mouth daily. 90 tablet 3  . FERROUS SULFATE-VITAMIN C PO Take 1 tablet by mouth daily.    . fludrocortisone (FLORINEF) 0.1 MG tablet Take 1/2 tablet by mouth daily 45 tablet 1  . hydroxypropyl methylcellulose / hypromellose (ISOPTO TEARS / GONIOVISC) 2.5 % ophthalmic solution Place 1 drop into both eyes 2 (two) times daily as needed for dry eyes.    . Omega-3 Fatty Acids (FISH OIL PO) Take 3 capsules by mouth daily.     No current facility-administered medications for this visit.     Past Medical History:  Diagnosis Date  . Abnormal nuclear stress test 08/17/2019  . Anemia   . Aortic stenosis, severe    s/p AVR with pericardial tissue valve Feb 2013 per Dr. Servando Snare  . Aortic valve defect   . Arthritis    "all over my body"  . Ascending aortic aneurysm ([place]) 08/11/2016   3.7cm on echo 12/2015  . Bradycardia by electrocardiogram   . 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 Full dentures   . Heart murmur   . History of anemia of  chronic disease   . History of hiatal hernia   . Hyperlipidemia   . Hypertension   . Hyponatremia    Mild hyponatremia, allowed to self-correct, asymptomatic  . OSA (obstructive sleep apnea)    "suppose to wear mask; can't" (09/28/2015)  . Osteoarthritis     End-stage osteoarthritis--left knee  . PVC (premature ventricular contraction) 08/17/2019  . Renal infarct ([place]) ~ 09/24/2015    ROS:   All systems reviewed and negative except as noted in the HPI.   Past Surgical History:  Procedure Laterality Date  . AORTIC VALVE REPLACEMENT  10/13/2011   Procedure: AORTIC VALVE REPLACEMENT (AVR);  Surgeon: Grace Isaac, MD;  Location: [place];  Service: Open Heart Surgery;  Laterality: N/A;  . APPENDECTOMY    . CARDIAC CATHETERIZATION  2013  . CARDIAC VALVE REPLACEMENT    . CARPAL TUNNEL RELEASE  06/01/2012   Procedure: CARPAL TUNNEL RELEASE;  Surgeon: Cammie Sickle., MD;  Location: [place];  Service: Orthopedics;  Laterality: Right;  . CHOLECYSTECTOMY OPEN    . CORONARY ANGIOGRAPHY N/A 10/19/2019   Procedure: CORONARY ANGIOGRAPHY;  Surgeon: Belva Crome, MD;  Location: [place] [facility];  Service: Cardiovascular;  Laterality: N/A;  .  JOINT REPLACEMENT    . POSTERIOR FUSION CERVICAL SPINE    . TEE WITHOUT CARDIOVERSION N/A 09/26/2015   Procedure: TRANSESOPHAGEAL ECHOCARDIOGRAM (TEE);  Surgeon: Chrystie Nose, MD;  Location[facility];  Service: Cardiovascular;  Laterality: N/A;  . TOTAL KNEE ARTHROPLASTY Bilateral   . ULNAR NERVE TRANSPOSITION  06/01/2012   Procedure: ULNAR NERVE DECOMPRESSION/TRANSPOSITION;  Surgeon: Wyn Forster., MD;  Location: [facility];  Service: Orthopedics;  Laterality: Right;  right ulnar nerve decompression     Family History  Problem Relation Age of Onset  . Throat cancer Mother   . Heart failure Father   . Lung cancer Father   . Hypertension Sister      Social History   Socioeconomic History  .  Marital status: Married    Spouse name: Not on file  . Number of children: Not on file  . Years of education: Not on file  . Highest education level: Not on file  Occupational History  . Not on file  Tobacco Use  . Smoking status: Former Smoker    Packs/day: 3.00    Years: 23.00    Pack years: 69.00    Types: Cigarettes    Quit date: 09/01/1981    Years since quitting: 38.2  . Smokeless tobacco: Never Used  Substance and Sexual Activity  . Alcohol use: No    Alcohol/week: 12.0 standard drinks    Types: 12 Cans of beer per week    Comment: "gave it up"  . Drug use: No  . Sexual activity: Not on file  Other Topics Concern  . Not on file  Social History Narrative  . Not on file   Social Determinants of Health   Financial Resource Strain:   . Difficulty of Paying Living Expenses: Not on file  Food Insecurity:   . Worried About Programme researcher, broadcasting/film/video in the Last Year: Not on file  . Ran Out of Food in the Last Year: Not on file  Transportation Needs:   . Lack of Transportation (Medical): Not on file  . Lack of Transportation (Non-Medical): Not on file  Physical Activity:   . Days of Exercise per Week: Not on file  . Minutes of Exercise per Session: Not on file  Stress:   . Feeling of Stress : Not on file  Social Connections:   . Frequency of Communication with Friends and Family: Not on file  . Frequency of Social Gatherings with Friends and Family: Not on file  . Attends Religious Services: Not on file  . Active Member of Clubs or Organizations: Not on file  . Attends Banker Meetings: Not on file  . Marital Status: Not on file  Intimate Partner Violence:   . Fear of Current or Ex-Partner: Not on file  . Emotionally Abused: Not on file  . Physically Abused: Not on file  . Sexually Abused: Not on file     BP 130/82   Pulse 82   Ht 6\' 2"  (1.88 m)   Wt 191 lb 3.2 oz (86.7 kg)   SpO2 95%   BMI 24.55 kg/m   Physical Exam:  Well appearing 84 yo man ,  NAD HEENT: Unremarkable Neck:  No JVD, no thyromegally Lymphatics:  No adenopathy Back:  No CVA tenderness Lungs:  Clear with no wheezes HEART:  Regular rate rhythm, no murmurs, no rubs, no clicks Abd:  soft, positive bowel sounds, no organomegally, no rebound, no guarding Ext:  2 plus  pulses, no edema, no cyanosis, no clubbing Skin:  No rashes no nodules Neuro:  CN II through XII intact, motor grossly intact  Assess/Plan: 1. PVC's - the patient is asymptomatic and his PVC burden is not particularly worrisome. His predilection for hypotension makes most all AA drugs contra-indicated. As he has been asymptomatic, I would not recommend any additional medication nor catheter ablation. 2. orthostasis - I agree with decision to try florinef. 3.  Dyslipidemia - he will continue fish oil and zetia.   Leonia Reeves.D.

## 2020-03-26 ENCOUNTER — Other Ambulatory Visit: Payer: Self-pay | Admitting: *Deleted

## 2020-03-26 MED ORDER — FLUDROCORTISONE ACETATE 0.1 MG PO TABS: ORAL_TABLET | ORAL | 3 refills | Status: DC

## 2020-03-26 NOTE — Telephone Encounter (Signed)
Refilled Florinef as requested

## 2020-10-02 ENCOUNTER — Other Ambulatory Visit: Payer: Self-pay | Admitting: *Deleted

## 2020-10-02 MED ORDER — EZETIMIBE 10 MG PO TABS: 10.0000 mg | ORAL_TABLET | Freq: Every day | ORAL | 3 refills | Status: DC

## 2020-10-02 NOTE — Telephone Encounter (Signed)
Refilled Zetia as requested.

## 2020-11-27 ENCOUNTER — Telehealth: Payer: Self-pay | Admitting: *Deleted

## 2020-11-27 NOTE — Telephone Encounter (Signed)
   [facility] Pre-operative Risk Assessment      Request for surgical clearance:  1. What type of surgery is being performed? Prostate biopsy  2. When is this surgery scheduled? 12/19/20  3. What type of clearance is required (medical clearance vs. Pharmacy clearance to hold med vs. Both)? medical  4. Are there any medications that need to be held prior to surgery and how long? Aspirin  5 days    5. Practice name and name of physician performing surgery?  [facility]; Dr Will Bonnet  6. What is the office phone number?  [phone]   7.   What is the office fax number?  [phone]  8.   Anesthesia type (None, local, MAC, general) ? unknown   Raiford Simmonds 11/27/2020, 12:13 PM  _________________________________________________________________   (provider comments below)

## 2020-11-28 ENCOUNTER — Telehealth: Payer: Self-pay | Admitting: *Deleted

## 2020-11-28 NOTE — Telephone Encounter (Signed)
   [facility] Pre-operative Risk Assessment     Request for surgical clearance:  1. What type of surgery is being performed? TURBT   2. When is this surgery scheduled? 12/04/2020   3. What type of clearance is required (medical clearance vs. Pharmacy clearance to hold med vs. Both)? BOTH  4. Are there any medications that need to be held prior to surgery and how long?ASA FOR 3 DAYS    5. Practice name and name of physician performing surgery? [facility]    6. What is the office phone number? [phone]   7.   What is the office fax number? [phone]  8.   Anesthesia type (None, local, MAC, general) ?

## 2020-11-28 NOTE — Telephone Encounter (Signed)
Nicholas Walls 85 year old male is requesting prostate biopsy.  He was last seen and evaluated by Dr. Ladona Ridgel for PVC burden.  No recommendations for medication made at that time.  He did not recommend catheter ablation.  His PMH includes bioprosthetic aortic valve replacement 2/13 with endocarditis which was treated medically, mild ascending aortic aneurysm, hyperlipidemia, and OSA.  Nuclear stress test 12/20 showed no ST segment deviation, medium defect, and was low risk study.  He underwent cardiac catheterization 10/19/2019 which showed mild mid LAD 10-15% plaque, normal large circumflex, small dominant RCA.  Felt that nuclear stress test was false positive.  No further evaluation was recommended.  May his aspirin be held prior to his procedure?  Thank you for your help.  Please direct response to CV DIV preop pool.  Thomasene Ripple. Latonja Bobeck NP-C    11/28/2020, 9:15 AM [facility] [address] Office [phone] Fax [phone]

## 2020-11-29 NOTE — Telephone Encounter (Signed)
OK to hold aspirin 5 days prior. ?

## 2020-12-03 NOTE — Telephone Encounter (Signed)
   Patient Name: Nicholas Walls  DOB: 03/31/1936  MRN: [id number]   Primary Cardiologist: Chilton Si, MD  Chart reviewed as part of pre-operative protocol coverage. Patient has TURBT planned for 12/04/2020 and prostate biopsy planned for 4/202022. Patient was recently seen by Dr. Ladona Ridgel on 11/08/2020 and was doing well from a cardiac standpoint at that time. Patient was contacted today for further pre-op evaluation and reported doing well since last visit. No chest pain, shortness of breath, significant palpitations, syncope, orthopnea, PND.  Given past medical history and time since last visit, based on ACC/AHA guidelines, [name] would be at acceptable risk for the planned procedure without further cardiovascular testing.   Per Dr. Duke Salvia, OK to hold Aspirin for 5 days prior to procedures. This should be restarted as soon as able following procedures.  I will route this recommendation to the requesting party via Epic fax function and remove from pre-op pool.  Please call with questions.  Corrin Parker, PA-C 12/03/2020, 9:43 AM

## 2020-12-03 NOTE — Telephone Encounter (Signed)
   Patient Name: Nicholas Walls  DOB: 1936-01-28  MRN: [id number]   Primary Cardiologist: Chilton Si, MD  Chart reviewed as part of pre-operative protocol coverage. Patient has TURBT planned for 12/04/2020 and prostate biopsy planned for 4/202022. Patient was recently seen by Dr. Ladona Ridgel on 11/08/2020 and was doing well from a cardiac standpoint at that time. Patient was contacted today for further pre-op evaluation and reported doing well since last visit. No chest pain, shortness of breath, significant palpitations, syncope, orthopnea, PND.  Given past medical history and time since last visit, based on ACC/AHA guidelines, [name] would be at acceptable risk for the planned procedure without further cardiovascular testing.   Per Dr. Duke Salvia, OK to hold Aspirin for 5 days prior to procedures. This should be restarted as soon as able following procedures.  I will route this recommendation to the requesting party via Epic fax function and remove from pre-op pool.  Please call with questions.  Corrin Parker, PA-C 12/03/2020 9:50 AM

## 2021-02-12 ENCOUNTER — Telehealth: Payer: Self-pay | Admitting: *Deleted

## 2021-02-12 DIAGNOSIS — Z953 Presence of xenogenic heart valve: Secondary | ICD-10-CM

## 2021-02-12 NOTE — Telephone Encounter (Signed)
Spoke with patient regarding repeat echo and follow up  Patient is not having any cardiac issues  Per Dr Duke Salvia will get Echo in December, follow up afterwards

## 2021-03-15 ENCOUNTER — Other Ambulatory Visit (HOSPITAL_BASED_OUTPATIENT_CLINIC_OR_DEPARTMENT_OTHER): Payer: Self-pay | Admitting: *Deleted

## 2021-03-15 MED ORDER — FLUDROCORTISONE ACETATE 0.1 MG PO TABS: ORAL_TABLET | ORAL | 3 refills | Status: DC

## 2021-03-15 NOTE — Telephone Encounter (Signed)
Spoke with patient and he requested refill of Fludrocortisone, sent to [facility]

## 2021-08-01 ENCOUNTER — Other Ambulatory Visit (HOSPITAL_COMMUNITY): Payer: Medicare Other

## 2021-08-08 ENCOUNTER — Ambulatory Visit (HOSPITAL_BASED_OUTPATIENT_CLINIC_OR_DEPARTMENT_OTHER): Payer: Medicare Other | Admitting: Cardiovascular Disease

## 2021-08-20 ENCOUNTER — Encounter (HOSPITAL_BASED_OUTPATIENT_CLINIC_OR_DEPARTMENT_OTHER): Payer: Self-pay | Admitting: Family

## 2021-08-20 ENCOUNTER — Ambulatory Visit (INDEPENDENT_AMBULATORY_CARE_PROVIDER_SITE_OTHER): Payer: Medicare Other | Admitting: Family

## 2021-08-20 ENCOUNTER — Ambulatory Visit (INDEPENDENT_AMBULATORY_CARE_PROVIDER_SITE_OTHER): Payer: Medicare Other

## 2021-08-20 ENCOUNTER — Other Ambulatory Visit: Payer: Self-pay

## 2021-08-20 VITALS — BP 122/70 | HR 90 | Ht 74.0 in | Wt 209.0 lb

## 2021-08-20 DIAGNOSIS — E782 Mixed hyperlipidemia: Secondary | ICD-10-CM | POA: Diagnosis not present

## 2021-08-20 DIAGNOSIS — I951 Orthostatic hypotension: Secondary | ICD-10-CM | POA: Diagnosis not present

## 2021-08-20 DIAGNOSIS — Z952 Presence of prosthetic heart valve: Secondary | ICD-10-CM | POA: Diagnosis not present

## 2021-08-20 DIAGNOSIS — Z953 Presence of xenogenic heart valve: Secondary | ICD-10-CM | POA: Diagnosis not present

## 2021-08-20 DIAGNOSIS — I493 Ventricular premature depolarization: Secondary | ICD-10-CM

## 2021-08-20 DIAGNOSIS — R0609 Other forms of dyspnea: Secondary | ICD-10-CM

## 2021-08-20 LAB — ECHOCARDIOGRAM COMPLETE
AR max vel: 1.7 cm2
AV Area VTI: 1.72 cm2
AV Area mean vel: 1.68 cm2
AV Mean grad: 10.7 mmHg
AV Peak grad: 19 mmHg
Ao pk vel: 2.18 m/s
Area-P 1/2: 3.81 cm2
Calc EF: 68.9 %
Height: 74 in
S' Lateral: 3.09 cm
Single Plane A2C EF: 79.3 %
Single Plane A4C EF: 57.1 %
Weight: 3344 oz

## 2021-08-20 MED ORDER — EZETIMIBE 10 MG PO TABS: 10.0000 mg | ORAL_TABLET | Freq: Every day | ORAL | 3 refills | Status: DC

## 2021-08-20 NOTE — Patient Instructions (Signed)
Medication Instructions:  Continue your current medications.   *If you need a refill on your cardiac medications before your next appointment, please call your pharmacy*  Lab Work: None ordered today.   Testing/Procedures: Your EKG today showed sinus rhythm with a rate of 90 bpm.    Follow-Up: At [facility], you and your health needs are our priority.  As part of our continuing mission to provide you with exceptional heart care, we have created designated Provider Care Teams.  These Care Teams include your primary Cardiologist (physician) and Advanced Practice Providers (APPs -  Physician Assistants and Nurse Practitioners) who all work together to provide you with the care you need, when you need it.  We recommend signing up for the patient portal called "MyChart".  Sign up information is provided on this After Visit Summary.  MyChart is used to connect with patients for Virtual Visits (Telemedicine).  Patients are able to view lab/test results, encounter notes, upcoming appointments, etc.  Non-urgent messages can be sent to your provider as well.   To learn more about what you can do with MyChart, go to [email or web address].    Your next appointment:   1 year(s)  The format for your next appointment:   In Person  Provider:   Chilton Si, MD or Alver Sorrow, NP     Other Instructions  Recommend drinking at least 64 ounces of fluid per day.   Please avoid things with caffeine.   It would be recommended to drink things like: Water (with a flavor packet is fine!) Caffeine-free soda Juice Milk

## 2021-08-20 NOTE — Progress Notes (Signed)
Office Visit    Patient Name: Nicholas Walls Date of Encounter: 08/20/2021  PCP:  Emelda Fear, [place]  Cardiologist:  Skeet Latch, MD  Advanced Practice Provider:  No care team member to display Electrophysiologist:  None      Chief Complaint    Nicholas Walls is a 85 y.o. male with a hx of aortic stenosis s/p AVR, PVC, hypotension on Florinef, dyslipidemia presents today for follow up of aortic stenosis and PVC   Past Medical History    Past Medical History:  Diagnosis Date   Abnormal nuclear stress test 08/17/2019   Anemia    Aortic stenosis, severe    s/p AVR with pericardial tissue valve Feb 2013 per Dr. 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   Full dentures    Heart murmur    History of anemia of chronic disease    History of hiatal hernia    Hyperlipidemia    Hypertension    Hyponatremia    Mild hyponatremia, allowed to self-correct, asymptomatic   OSA (obstructive sleep apnea)    "suppose to wear mask; can't" (09/28/2015)   Osteoarthritis     End-stage osteoarthritis--left knee   PVC (premature ventricular contraction) 08/17/2019   Renal infarct ([place]) ~ 09/24/2015   Past Surgical History:  Procedure Laterality Date   AORTIC VALVE REPLACEMENT  10/13/2011   Procedure: AORTIC VALVE REPLACEMENT (AVR);  Surgeon: Grace Isaac, MD;  Location: [place];  Service: Open Heart Surgery;  Laterality: N/A;   APPENDECTOMY     CARDIAC CATHETERIZATION  2013   CARDIAC VALVE REPLACEMENT     CARPAL TUNNEL RELEASE  06/01/2012   Procedure: CARPAL TUNNEL RELEASE;  Surgeon: Cammie Sickle., MD;  Location: [place];  Service: Orthopedics;  Laterality: Right;   CHOLECYSTECTOMY OPEN     CORONARY ANGIOGRAPHY N/A 10/19/2019    Procedure: CORONARY ANGIOGRAPHY;  Surgeon: Belva Crome, MD;  Location: [place] [facility];  Service: Cardiovascular;  Laterality: N/A;   JOINT REPLACEMENT     POSTERIOR FUSION CERVICAL SPINE     TEE WITHOUT CARDIOVERSION N/A 09/26/2015   Procedure: TRANSESOPHAGEAL ECHOCARDIOGRAM (TEE);  Surgeon: Pixie Casino, MD;  [facility];  Service: Cardiovascular;  Laterality: N/A;   TOTAL KNEE ARTHROPLASTY Bilateral    ULNAR NERVE TRANSPOSITION  06/01/2012   Procedure: ULNAR NERVE DECOMPRESSION/TRANSPOSITION;  Surgeon: Cammie Sickle., MD;  Location: [place];  Service: Orthopedics;  Laterality: Right;  right ulnar nerve decompression    Allergies  Allergies  Allergen Reactions   Metoprolol     HR 47 on metoprolol 12.5 mg daily.   Ultram [Tramadol Hcl]     "seeing things"    History of Present Illness    Nicholas Walls is a 85 y.o. male with a hx of aortic stenosis s/p bioprosthetic AVR (10/2011) with endocarditis treated medically, mild ascending aortic aneurysm, OSA, PVC, hypotension on Florinef, dyslipidemia  last seen by Dr. Lovena Le 10/2019.  He developed a vegetation of prosthetic valve 09/2015 with blood cultures positive for MRSA. He was treated with 6 weeks of vancomycin, rifampin, and 2 weeks of gentamicin. Follow up echo 12/2015 with no recurrent endocarditis, grade 1 diastolic  dysfunction. He has been maintained on Zetia due to worsened memory deficit on statin. When seen 06/2021 he noted orthostatic changes. Carotid doppler 40/9811 with mild LICA stenosis and non on the right. Monitor 06/2019 with 11% PVC burden. Echo 06/2019 LVEF 60-65%, modearte MAC, aortic valve functioning properly. Myoview 08/2019 concerning for infarct in basal to mid inferior region. Florinef improved symptoms though had to be reduced to half tablet due to hypernatremia. Cardiac cath 10/2019 with normal LM, patent LAD with mild systolic compression up to 10-15%, normal Cx, patent RCA  and myoview thought to be false positive.   He was evaluated 10/2019 by Dr. Lovena Le due to 11% PVC burden by monitor. He was unaware of PVC. Metoprolol had been discontinued previously due to hypotension. As he was asymptomatic, no further treatment was recommended.  He presents today for follow up with his two daughters who assist in history taking. He is mostly sedentary watching TV throughout the day. Did have an episode within the last month where he was at [facility] and had to sit down due to feeling fatigued. Does not recall specific symptoms of shortness of breath but notes if he over exerts he may feel dyspneic. Reports no shortness of breath at rest. Reports no chest pain, pressure, or tightness. No edema, orthopnea, PND. Reports no palpitations.  Notes occasional lightheadedness with quick position changes but this is overall improved with Florinef and symptoms are very rare. He reports no near syncope nor syncope. He has echocardiogram after our appointment today for monitoring of AVR.   EKGs/Labs/Other Studies Reviewed:   The following studies were reviewed today:   EKG:  EKG is ordered today.  The ekg ordered today demonstrates NSR 90 bpm with first degree AV block PR 230. No acute St/T wave changes.   Recent Labs: No results found for requested labs within last 8760 hours.  Recent Lipid Panel    Component Value Date/Time   CHOL 144 03/10/2018 0925   TRIG 78 03/10/2018 0925   HDL 40 03/10/2018 0925   CHOLHDL 3.6 03/10/2018 0925   CHOLHDL 5.4 (H) 12/25/2015 1011   VLDL 19 12/25/2015 1011   LDLCALC 88 03/10/2018 0925   Home Medications   Current Meds  Medication Sig   acetaminophen (TYLENOL) 325 MG tablet Take 650 mg by mouth every 6 (six) hours as needed for moderate pain.   aspirin EC 81 MG tablet Take 81 mg by mouth daily.   FERROUS SULFATE-VITAMIN C PO Take 1 tablet by mouth daily.   fludrocortisone (FLORINEF) 0.1 MG tablet Take 1/2 tablet by mouth daily   hydroxypropyl  methylcellulose / hypromellose (ISOPTO TEARS / GONIOVISC) 2.5 % ophthalmic solution Place 1 drop into both eyes 2 (two) times daily as needed for dry eyes.   Multiple Vitamin (MULTIVITAMIN ADULT PO) Take by mouth daily.   Omega-3 Fatty Acids (FISH OIL PO) Take 3 capsules by mouth daily.   [DISCONTINUED] ezetimibe (ZETIA) 10 MG tablet Take 1 tablet (10 mg total) by mouth daily.     Review of Systems      All other systems reviewed and are otherwise negative except as noted above.  Physical Exam    VS:  BP 122/70    Pulse 90    Ht _0  (1.88 m)    Wt 209 lb (94.8 kg)    BMI 26.83 kg/m  , BMI Body mass index is 26.83 kg/m.  Wt Readings from Last 3 Encounters:  08/20/21 209 lb (94.8 kg)  11/09/19  191 lb 3.2 oz (86.7 kg)  10/19/19 190 lb (86.2 kg)     GEN: Well nourished, well developed, in no acute distress. HEENT: normal. Neck: Supple, no JVD, carotid bruits, or masses. Cardiac: RRR, no murmurs, rubs, or gallops. No clubbing, cyanosis, edema.  Radials/PT 2+ and equal bilaterally.  Respiratory:  Respirations regular and unlabored, clear to auscultation bilaterally. GI: Soft, nontender, nondistended. MS: No deformity or atrophy. Skin: Warm and dry, no rash. Neuro:  Strength and sensation are intact. Psych: Normal affect.  Assessment & Plan    AS s/p AVR  - Echo later this afternoon for monitoring. Previous endocarditis 2017 treated with IV abx. Continue optimal BP control.   DOE - Update echo as above to rule out heart failure or significant valvular abnormality. Encouraged him to increase activity as deconditioning likely contributory.   PVC -  11% burden by previous monitor though asymptomatic. No BB or CCB due to hypotension. Previously evaluated by Dr. Lovena Le. To avoid PVC, recommend staying adequately hydrated with at least 64 oz of fluid per day and avoiding caffeine.   HLD - previous statin intolerance with memory impairment. Continue Zetia 9m QD. LDL goal <100. Labs  followed by primary care.   Orthostasis - Well controlled on Florinef 0.05 QD. Previous hypernatremia with higher doses.   Disposition: Follow up in 1 year(s) with TSkeet Latch MD or APP.  Signed, CLoel Dubonnet NP 08/20/2021, 3:16 PM [place]

## 2021-10-01 ENCOUNTER — Other Ambulatory Visit (HOSPITAL_BASED_OUTPATIENT_CLINIC_OR_DEPARTMENT_OTHER): Payer: Self-pay | Admitting: *Deleted

## 2021-10-01 DIAGNOSIS — E782 Mixed hyperlipidemia: Secondary | ICD-10-CM

## 2021-10-01 MED ORDER — EZETIMIBE 10 MG PO TABS: 10.0000 mg | ORAL_TABLET | Freq: Every day | ORAL | 3 refills | Status: DC

## 2021-10-01 NOTE — Telephone Encounter (Signed)
Refilled patients Zetia, has changed pharmacies

## 2022-03-25 ENCOUNTER — Other Ambulatory Visit: Payer: Self-pay | Admitting: Cardiovascular Disease

## 2022-03-26 NOTE — Telephone Encounter (Signed)
Rx request sent to pharmacy.  

## 2022-05-08 ENCOUNTER — Encounter (HOSPITAL_BASED_OUTPATIENT_CLINIC_OR_DEPARTMENT_OTHER): Payer: Self-pay

## 2022-10-03 ENCOUNTER — Other Ambulatory Visit (HOSPITAL_BASED_OUTPATIENT_CLINIC_OR_DEPARTMENT_OTHER): Payer: Self-pay

## 2022-10-03 DIAGNOSIS — E782 Mixed hyperlipidemia: Secondary | ICD-10-CM

## 2022-10-03 MED ORDER — EZETIMIBE 10 MG PO TABS: 10.0000 mg | ORAL_TABLET | Freq: Every day | ORAL | 3 refills | Status: DC

## 2022-10-03 NOTE — Telephone Encounter (Signed)
Pt. Requested refill of Zetia 10mg - 90 day supply, he knows to schedule an appointment for further refills.

## 2022-12-26 ENCOUNTER — Other Ambulatory Visit: Payer: Self-pay | Admitting: Cardiovascular Disease

## 2023-07-27 ENCOUNTER — Other Ambulatory Visit (HOSPITAL_BASED_OUTPATIENT_CLINIC_OR_DEPARTMENT_OTHER): Payer: Self-pay | Admitting: Cardiovascular Disease

## 2023-07-27 ENCOUNTER — Other Ambulatory Visit: Payer: Self-pay | Admitting: Cardiovascular Disease

## 2023-07-27 DIAGNOSIS — E782 Mixed hyperlipidemia: Secondary | ICD-10-CM

## 2023-09-25 ENCOUNTER — Telehealth (HOSPITAL_BASED_OUTPATIENT_CLINIC_OR_DEPARTMENT_OTHER): Payer: Self-pay | Admitting: *Deleted

## 2023-09-25 DIAGNOSIS — Z953 Presence of xenogenic heart valve: Secondary | ICD-10-CM

## 2023-09-25 DIAGNOSIS — E782 Mixed hyperlipidemia: Secondary | ICD-10-CM

## 2023-09-25 MED ORDER — EZETIMIBE 10 MG PO TABS
10.0000 mg | ORAL_TABLET | Freq: Every day | ORAL | 1 refills | Status: DC
Start: 2023-09-25 — End: 2023-09-25
  Filled 2023-09-25: qty 90, 90d supply, fill #0

## 2023-09-25 MED ORDER — FLUDROCORTISONE ACETATE 0.1 MG PO TABS: ORAL_TABLET | ORAL | 1 refills | Status: DC

## 2023-09-25 MED ORDER — FLUDROCORTISONE ACETATE 0.1 MG PO TABS
ORAL_TABLET | ORAL | 1 refills | Status: DC
  Filled 2023-09-25: qty 45, fill #0

## 2023-09-25 MED ORDER — EZETIMIBE 10 MG PO TABS
10.0000 mg | ORAL_TABLET | Freq: Every day | ORAL | 1 refills | Status: DC
Start: 2023-09-25 — End: 2023-12-25

## 2023-09-25 NOTE — Telephone Encounter (Signed)
Refilled meds and scheduled follow up

## 2023-09-26 ENCOUNTER — Other Ambulatory Visit (HOSPITAL_BASED_OUTPATIENT_CLINIC_OR_DEPARTMENT_OTHER): Payer: Self-pay

## 2023-09-28 ENCOUNTER — Other Ambulatory Visit (HOSPITAL_BASED_OUTPATIENT_CLINIC_OR_DEPARTMENT_OTHER): Payer: Self-pay

## 2023-11-06 NOTE — Addendum Note (Signed)
 Addended by: Regis Bill B on: 11/06/2023 02:57 PM   Modules accepted: Orders

## 2023-12-25 ENCOUNTER — Encounter (HOSPITAL_BASED_OUTPATIENT_CLINIC_OR_DEPARTMENT_OTHER): Payer: Self-pay | Admitting: Family

## 2023-12-25 ENCOUNTER — Ambulatory Visit (INDEPENDENT_AMBULATORY_CARE_PROVIDER_SITE_OTHER): Payer: Medicare Other

## 2023-12-25 ENCOUNTER — Ambulatory Visit (INDEPENDENT_AMBULATORY_CARE_PROVIDER_SITE_OTHER): Payer: Medicare Other | Admitting: Family

## 2023-12-25 VITALS — BP 110/60 | HR 68 | Ht 73.0 in | Wt 205.0 lb

## 2023-12-25 DIAGNOSIS — I959 Hypotension, unspecified: Secondary | ICD-10-CM

## 2023-12-25 DIAGNOSIS — E782 Mixed hyperlipidemia: Secondary | ICD-10-CM

## 2023-12-25 DIAGNOSIS — Z953 Presence of xenogenic heart valve: Secondary | ICD-10-CM

## 2023-12-25 DIAGNOSIS — I493 Ventricular premature depolarization: Secondary | ICD-10-CM | POA: Diagnosis not present

## 2023-12-25 LAB — ECHOCARDIOGRAM COMPLETE
Area-P 1/2: 2.91 cm2
MV M vel: 5.09 m/s
MV Peak grad: 103.6 mmHg
S' Lateral: 2.68 cm

## 2023-12-25 MED ORDER — FLUDROCORTISONE ACETATE 0.1 MG PO TABS: ORAL_TABLET | ORAL | 3 refills | Status: DC

## 2023-12-25 MED ORDER — EZETIMIBE 10 MG PO TABS: 10.0000 mg | ORAL_TABLET | Freq: Every day | ORAL | 3 refills | Status: DC

## 2023-12-25 NOTE — Progress Notes (Signed)
 Cardiology Office Note:  .   Date:  12/25/2023  ID:  Nicholas Walls, DOB [birth year], MRN [id number] PCP: Dorrine Gaudy, DO  [facility] Providers Cardiologist:  Maudine Sos, MD Cardiology APP:  Clearnce Curia, NP    History of Present Illness: .   Nicholas Walls is a 88 y.o. male with a hx of aortic stenosis s/p bioprosthetic AVR (10/2011) with endocarditis treated medically, mild ascending aortic aneurysm, OSA, PVC, hypotension on Florinef , dyslipidemia.  He developed a vegetation of prosthetic valve 09/2015 with blood cultures positive for MRSA. He was treated with 6 weeks of vancomycin , rifampin , and 2 weeks of gentamicin . Follow up echo 12/2015 with no recurrent endocarditis, grade 1 diastolic dysfunction. He has been maintained on Zetia  due to worsened memory deficit on statin. When seen 06/2021 he noted orthostatic changes. Carotid doppler 07/2019 with mild LICA stenosis and non on the right. Monitor 06/2019 with 11% PVC burden. Echo 06/2019 LVEF 60-65%, modearte MAC, aortic valve functioning properly. Myoview  08/2019 concerning for infarct in basal to mid inferior region. Florinef  improved symptoms though had to be reduced to half tablet due to hypernatremia. Cardiac cath 10/2019 with normal LM, patent LAD with mild systolic compression up to 10-15%, normal Cx, patent RCA and myoview  thought to be false positive.    He was evaluated 10/2019 by Dr. Carolynne Citron due to 11% PVC burden by monitor. He was unaware of PVC. Metoprolol  had been discontinued previously due to hypotension. As he was asymptomatic, no further treatment was recommended.  Last seen 08/2021 doing overall well from a cardiac perspective.  Echo 08/20/2021 normal LVEF 60 to 65%, mild LVH, gr1dd, mild MR, mild MS, aortic valve prosthesis functioning appropriately.   He presents today for follow up with his daughters.  Pleasant gentleman who lives at home with his wife.  He had echocardiogram earlier this morning,  results not yet available. He is mostly sedentary watching TV throughout the day. Notes occasional lightheadedness with quick position changes but this is overall infrequent.  Eating 2 meals per day and drinking 2 bottles of water, encouraged to aim for 4 bottles of water per day. Reports no shortness of breath nor dyspnea on exertion. Reports no chest pain, pressure, or tightness. No  orthopnea, PND. Mild edema in L ankle by end of day which improves by morning and is not bothersome. Reports no palpitations.    ROS: Please see the history of present illness.    All other systems reviewed and are negative.   Studies Reviewed: Aaron Aas   EKG Interpretation Date/Time:  Friday December 25 2023 10:51:59 EDT Ventricular Rate:  66 PR Interval:  214 QRS Duration:  92 QT Interval:  412 QTC Calculation: 431 R Axis:   -6  Text Interpretation: Sinus rhythm with 1st degree A-V block with Premature atrial complexes No acute ST/T wave changes. Confirmed by Neomi Banks ([id number]) on 12/25/2023 1:32:25 PM    Cardiac Studies & Procedures   ______________________________________________________________________________________________ CARDIAC CATHETERIZATION  CARDIAC CATHETERIZATION 10/19/2019  Conclusion  Normal left main  Large dominant widely patent LAD with segmental mid mild systolic compression up to 10 to 15%.  Normal large circumflex.  Small codominant right coronary.  Widely patent.  RECOMMENDATIONS:   The nuclear stress test could be false positive, related to microvascular dysfunction, or evidence of prior embolic infarction.  In absence of ischemic symptoms, no further evaluation seems prudent.  Findings Coronary Findings Diagnostic  Dominance: Co-dominant  Left Anterior Descending Mid LAD lesion is 10%  stenosed.  Intervention  No interventions have been documented.   STRESS TESTS  MYOCARDIAL PERFUSION IMAGING 08/04/2019  Narrative  There was no ST segment deviation noted  during stress.  Defect 1: There is a medium defect of mild severity present in the basal inferior, mid inferior and apex location.  This is a low risk study.  Low risk stress nuclear study with small fixed inferior and apical defects possibly secondary to thinning.  There is no ischemia.  The study was not gated due to frequent PVCs.  Suggest echocardiogram to quantify LV function.   ECHOCARDIOGRAM  ECHOCARDIOGRAM COMPLETE 12/25/2023  Narrative ECHOCARDIOGRAM REPORT    Patient Name:   Nicholas Walls Date of Exam: 12/25/2023 Medical Rec #:  [id number]         Height:       74.0 in Accession #:    [id number]        Weight:       209.0 lb Date of Birth:  15-Oct-1935         BSA:          2.215 m Patient Age:    87 years          BP:           110/70 mmHg Patient Gender: M                 HR:           75 bpm. Exam Location:  Outpatient  Procedure: 2D Echo, 3D Echo, Cardiac Doppler, Color Doppler and Strain Analysis (Both Spectral and Color Flow Doppler were utilized during procedure).  Indications:    Aortic Valve Replacement  History:        Patient has prior history of Echocardiogram examinations, most recent 08/20/2021. Endocarditis, Arrythmias:PVC and Bradycardia; Risk Factors:Former Smoker, Hypertension and Dyslipidemia. 2020 s/p AVR with pericardial tissue valve Feb 2013 per Dr. Nicanor Barge; Ascending aortic aneurysm 3.7cm on echo 12/2015.  Aortic Valve: 25 mm Edwards bioprosthetic valve is present in the aortic position.  Sonographer:    Gelene Kelly RDCS Referring Phys: [id number] TIFFANY [place]  IMPRESSIONS   1. S/P AVR with no AI and mean gradient 13 mmHg (normal function); mild MS (mean gradient 5.6 mmHg). 2. Left ventricular ejection fraction, by estimation, is 60 to 65%. The left ventricle has normal function. The left ventricle has no regional wall motion abnormalities. There is mild left ventricular hypertrophy. Left ventricular diastolic parameters are  indeterminate. Elevated left atrial pressure. The average left ventricular global longitudinal strain is -22.3 %. The global longitudinal strain is normal. 3. Right ventricular systolic function is normal. The right ventricular size is normal. 4. Left atrial size was mildly dilated. 5. The mitral valve is normal in structure. Mild mitral valve regurgitation. Mild mitral stenosis. Moderate mitral annular calcification. 6. The aortic valve has been repaired/replaced. Aortic valve regurgitation is not visualized. No aortic stenosis is present. There is a 25 mm Edwards bioprosthetic valve present in the aortic position. 7. The inferior vena cava is normal in size with greater than 50% respiratory variability, suggesting right atrial pressure of 3 mmHg.  FINDINGS Left Ventricle: Left ventricular ejection fraction, by estimation, is 60 to 65%. The left ventricle has normal function. The left ventricle has no regional wall motion abnormalities. The average left ventricular global longitudinal strain is -22.3 %. Strain was performed and the global longitudinal strain is normal. The left ventricular internal cavity size was normal in size. There is mild left  ventricular hypertrophy. Left ventricular diastolic parameters are indeterminate. Elevated left atrial pressure.  Right Ventricle: The right ventricular size is normal. Right ventricular systolic function is normal.  Left Atrium: Left atrial size was mildly dilated.  Right Atrium: Right atrial size was normal in size.  Pericardium: There is no evidence of pericardial effusion.  Mitral Valve: The mitral valve is normal in structure. Moderate mitral annular calcification. Mild mitral valve regurgitation. Mild mitral valve stenosis.  Tricuspid Valve: The tricuspid valve is normal in structure. Tricuspid valve regurgitation is mild . No evidence of tricuspid stenosis.  Aortic Valve: The aortic valve has been repaired/replaced. Aortic valve  regurgitation is not visualized. No aortic stenosis is present. There is a 25 mm Edwards bioprosthetic valve present in the aortic position.  Pulmonic Valve: The pulmonic valve was normal in structure. Pulmonic valve regurgitation is trivial. No evidence of pulmonic stenosis.  Aorta: The aortic root is normal in size and structure.  Venous: The inferior vena cava is normal in size with greater than 50% respiratory variability, suggesting right atrial pressure of 3 mmHg.  IAS/Shunts: No atrial level shunt detected by color flow Doppler.  Additional Comments: S/P AVR with no AI and mean gradient 13 mmHg (normal function); mild MS (mean gradient 5.6 mmHg). 3D was performed not requiring image post processing on an independent workstation and was normal.   LEFT VENTRICLE PLAX 2D LVIDd:         4.68 cm   Diastology LVIDs:         2.68 cm   LV e' medial:    6.31 cm/s LV PW:         1.20 cm   LV E/e' medial:  21.7 LV IVS:        0.96 cm   LV e' lateral:   8.70 cm/s LVOT diam:     2.00 cm   LV E/e' lateral: 15.7 LV SV:         65 LV SV Index:   30        2D Longitudinal Strain LVOT Area:     3.14 cm  2D Strain GLS (A4C):   -20.6 % 2D Strain GLS (A3C):   -25.2 % 2D Strain GLS (A2C):   -21.2 % 2D Strain GLS Avg:     -22.3 %  3D Volume EF: 3D EF:        65 % LV EDV:       135 ml LV ESV:       48 ml LV SV:        87 ml  RIGHT VENTRICLE RV Basal diam:  4.94 cm RV Mid diam:    3.53 cm RV S prime:     13.10 cm/s TAPSE (M-mode): 1.7 cm  LEFT ATRIUM             Index        RIGHT ATRIUM           Index LA diam:        3.80 cm 1.72 cm/m   RA Area:     22.60 cm LA Vol (A2C):   77.4 ml 34.95 ml/m  RA Volume:   65.70 ml  29.67 ml/m LA Vol (A4C):   82.5 ml 37.25 ml/m LA Biplane Vol: 82.1 ml 37.07 ml/m AORTIC VALVE LVOT Vmax:   91.50 cm/s LVOT Vmean:  60.500 cm/s LVOT VTI:    0.208 m  AORTA Ao Root diam: 3.40 cm  MITRAL VALVE  TRICUSPID VALVE MV Area (PHT): 2.91 cm      TR Peak grad:   28.9 mmHg MV Decel Time: 261 msec     TR Vmax:        269.00 cm/s MR Peak grad: 103.6 mmHg MR Vmax:      509.00 cm/s   SHUNTS MR Vmean:     386.0 cm/s    Systemic VTI:  0.21 m MV E velocity: 137.00 cm/s  Systemic Diam: 2.00 cm MV A velocity: 144.00 cm/s MV E/A ratio:  0.95  Alexandria Angel MD Electronically signed by Alexandria Angel MD Signature Date/Time: 12/25/2023/11:41:39 AM    Final   TEE  ECHO TEE 09/26/2015  Narrative *[place]* *[facility]* [address] [place], [place] [address] [phone]  ------------------------------------------------------------------- Transesophageal Echocardiography  (Report amended )  Patient:    Kyshawn, Teal MR #:       [id number] Study Date: 09/26/2015 Gender:     M Age:        15 Height:     188 cm Weight:     90.5 kg BSA:        2.18 m^2 Pt. Status: Room:       3W09C  PERFORMING   Dinah Franco MD SONOGRAPHER  Jolynn Needy ATTENDING    Christoper Crafts, M.D. ORDERING     Christoper Crafts, M.D. REFERRING    Christoper Crafts, M.D. ADMITTING    Abbott Abbot, Elin.Echevaria T  cc:  ------------------------------------------------------------------- LV EF: 55% -   60%  ------------------------------------------------------------------- Indications:      Fever 780.6.  ------------------------------------------------------------------- History:   PMH:  Sleep apnea. S/P AVR with a bioprosthetic valve. Pure hypercholesterolemia.  ------------------------------------------------------------------- Study Conclusions  - Left ventricle: The cavity size was normal. There was mild concentric hypertrophy. Systolic function was normal. The estimated ejection fraction was in the range of 55% to 60%. Wall motion was normal; there were no regional wall motion abnormalities. - Aortic valve: Bioprosthetic aortic valve. There is a 2.5 cm x 0.5 cm filmantous, mobile vegetation attached to the valve  and extending into the aortic root. There was trivial regurgitation. - Aorta: Mild atheromatous disease. - Mitral valve: There was mild regurgitation. - Left atrium: The atrium was dilated. No evidence of thrombus in the atrial cavity or appendage. - Pulmonary veins: No anomaly. - Right atrium: No evidence of thrombus in the atrial cavity or appendage. - Atrial septum: No defect or patent foramen ovale was identified. - Tricuspid valve: No evidence of vegetation. - Pulmonic valve: No evidence of vegetation.  Impressions:  - 1. 2.5 cm x 0.5 cm filamentous, mobile vegetation attached to the bioprosthetic aortic valve. Trivial AI. 2. Mild miltral regurgitation. 3. No LAA thrombus 4. Normal LV function.  Recommendations:  1. Antibiotic therapy for at least 6 weeks for infectious endocarditis. Likely to need a CT surgery evaluation at some point as he may need repeat AVR.             Diagnostic transesophageal echocardiography.  2D and color Doppler. Birthdate:  Patient birthdate: [birth year].  Age:  Patient is 88 yr old.  Sex:  Gender: male.    BMI: 25.6 kg/m^2.  Blood pressure: 115/67  Patient status:  Inpatient.  Study date:  Study date: 09/26/2015. Study time: 02:55 PM.  Location:  Endoscopy.  -------------------------------------------------------------------  ------------------------------------------------------------------- Left ventricle:  The cavity size was normal. There was mild concentric hypertrophy. Systolic function was normal. The estimated ejection fraction was in the range of 55% to  60%. Wall motion was normal; there were no regional wall motion abnormalities.  ------------------------------------------------------------------- Aortic valve:  Bioprosthetic aortic valve. There is a 2.5 cm x 0.5 cm filmantous, mobile vegetation attached to the valve and extending into the aortic root.  Doppler:  There was  trivial regurgitation.  ------------------------------------------------------------------- Aorta:  Mild atheromatous disease.  ------------------------------------------------------------------- Mitral valve:   Mildly thickened leaflets .  Doppler:  There was mild regurgitation.  ------------------------------------------------------------------- Left atrium:  The atrium was dilated.  No evidence of thrombus in the atrial cavity or appendage.  ------------------------------------------------------------------- Atrial septum:  No defect or patent foramen ovale was identified.  ------------------------------------------------------------------- Pulmonary veins:  No anomaly.  ------------------------------------------------------------------- Right ventricle:  The cavity size was normal. Wall thickness was normal. Systolic function was normal.  ------------------------------------------------------------------- Pulmonic valve:    Structurally normal valve.   Cusp separation was normal.  No evidence of vegetation.  ------------------------------------------------------------------- Tricuspid valve:   Structurally normal valve.   Leaflet separation was normal.  No evidence of vegetation.  Doppler:  There was trivial regurgitation.  ------------------------------------------------------------------- Pulmonary artery:   The main pulmonary artery was normal-sized.  ------------------------------------------------------------------- Right atrium:  The atrium was normal in size.  No evidence of thrombus in the atrial cavity or appendage.  ------------------------------------------------------------------- Pericardium:  There was no pericardial effusion.  ------------------------------------------------------------------- Post procedure conclusions Ascending Aorta:  - Mild atheromatous  disease.  ------------------------------------------------------------------- Yasmin Helling MD 2017-01-25T19:30:22  MONITORS  LONG TERM MONITOR (3-14 DAYS) 08/30/2019  Narrative 3 Day Event Monitor  Quality: Fair.  Baseline artifact. Predominant rhythm: sinus rhytm Average heart rate: 71 bpm Max heart rate: 112 bpm Min heart rate: 45 bpm Pauses >2.5 seconds: non  Frequent PVCs (13%) 5 beats of NSVT   Tiffany C. Theodis Fiscal, MD, [facility] 09/23/2019 5:09 PM       ______________________________________________________________________________________________      Risk Assessment/Calculations:             Physical Exam:   VS:  BP 110/60 (BP Location: Left Arm, Patient Position: Sitting, Cuff Size: Normal)   Pulse 68   Ht 6\' 1"  (1.854 m)   Wt 205 lb (93 kg)   BMI 27.05 kg/m    Wt Readings from Last 3 Encounters:  12/25/23 205 lb (93 kg)  08/20/21 209 lb (94.8 kg)  11/09/19 191 lb 3.2 oz (86.7 kg)    GEN: Well nourished, well developed in no acute distress NECK: No JVD; No carotid bruits CARDIAC: RRR, no murmurs, rubs, gallops RESPIRATORY:  Clear to auscultation without rales, wheezing or rhonchi  ABDOMEN: Soft, non-tender, non-distended EXTREMITIES:  No edema; No deformity   ASSESSMENT AND PLAN: .    AS s/p AVR  - Echo performed earlier today for monitoring, results not yet available. Previous endocarditis 2017 treated with IV abx. Continue optimal BP control. PVC -  11% burden by previous monitor though asymptomatic. No BB or CCB due to hypotension. Previously evaluated by Dr. Carolynne Citron. To avoid PVC, recommend staying adequately hydrated with at least 64 oz of fluid per day and avoiding caffeine.  Occasional PAC noted by EKG today which was asymptomatic. HLD - previous statin intolerance with memory impairment. Continue Zetia  10mg  QD. LDL goal <100. Labs followed by primary care.  Will request updated labs from their office.   Orthostasis - Well controlled on  Florinef  0.05 QD. Previous hypernatremia with higher doses. Continue Florinef  0.05mg  every day, refill provided. Encouraged to increase from 2 to 4 bottles of water. Additionally discussed seated exercises to increase physical  activity without prompting orthostasis.        Dispo: follow up in 1 year  Signed, Clearnce Curia, NP

## 2023-12-25 NOTE — Patient Instructions (Addendum)
 Medication Instructions:  Continue your current medications.   Lab Work: We will request your lab work from primary care provider   Testing/Procedures: Your EKG today looked good!  Your next appointment:   1 year(s)  Provider:   Maudine Sos, MD, Slater Duncan, NP, or Neomi Banks, NP    Other Instructions   Aim for 4 bottles of water per day.  Okay to use electrolyte packet.   Keep your feet up during the day to help with swelling.       Exercises to do While Sitting Warm-up Before starting other exercises: Sit up as straight as you can. Have your knees bent at 90 degrees, which is the shape of the capital letter "L." Keep your feet flat on the floor. Sit at the front edge of your chair, if you can. Pull in (tighten) the muscles in your abdomen and stretch your spine and neck as straight as you can. Hold this position for a few minutes. Breathe in and out evenly. Try to concentrate on your breathing, and relax your mind.  Stretching Exercise A: Arm stretch Hold your arms out straight in front of your body. Bend your hands at the wrist with your fingers pointing up, as if signaling someone to stop. Notice the slight tension in your forearms as you hold the position. Keeping your arms out and your hands bent, rotate your hands outward as far as you can and hold this stretch. Aim to have your thumbs pointing up and your pinkie fingers pointing down. Slowly repeat arm stretches for one minute as tolerated. Exercise B: Leg stretch If you can move your legs, try to "draw" letters on the floor with the toes of your foot. Write your name with one foot. Write your name with the toes of your other foot. Slowly repeat the movements for one minute as tolerated. Exercise C: Reach for the sky Reach your hands as far over your head as you can to stretch your spine. Move your hands and arms as if you are climbing a rope. Slowly repeat the movements for one minute as  tolerated.  Range of motion exercises Exercise A: Shoulder roll Let your arms hang loosely at your sides. Lift just your shoulders up toward your ears, then let them relax back down. When your shoulders feel loose, rotate your shoulders in backward and forward circles. Do shoulder rolls slowly for one minute as tolerated. Exercise B: March in place As if you are marching, pump your arms and lift your legs up and down. Lift your knees as high as you can. If you are unable to lift your knees, just pump your arms and move your ankles and feet up and down. March in place for one minute as tolerated. Exercise C: Seated jumping jacks Let your arms hang down straight. Keeping your arms straight, lift them up over your head. Aim to point your fingers to the ceiling. While you lift your arms, straighten your legs and slide your heels along the floor to your sides, as wide as you can. As you bring your arms back down to your sides, slide your legs back together. If you are unable to use your legs, just move your arms. Slowly repeat seated jumping jacks for one minute as tolerated.  Strengthening exercises Exercise A: Shoulder squeeze Hold your arms straight out from your body to your sides, with your elbows bent and your fists pointed at the ceiling. Keeping your arms in the bent position, move them forward  so your elbows and forearms meet in front of your face. Open your arms back out as wide as you can with your elbows still bent, until you feel your shoulder blades squeezing together. Hold for 5 seconds. Slowly repeat the movements forward and backward for one minute as tolerated.

## 2023-12-31 ENCOUNTER — Encounter (HOSPITAL_BASED_OUTPATIENT_CLINIC_OR_DEPARTMENT_OTHER): Payer: Self-pay

## 2024-03-21 ENCOUNTER — Other Ambulatory Visit (HOSPITAL_BASED_OUTPATIENT_CLINIC_OR_DEPARTMENT_OTHER): Payer: Self-pay | Admitting: *Deleted

## 2024-03-21 ENCOUNTER — Other Ambulatory Visit (HOSPITAL_BASED_OUTPATIENT_CLINIC_OR_DEPARTMENT_OTHER): Payer: Self-pay | Admitting: Family

## 2024-03-21 DIAGNOSIS — E782 Mixed hyperlipidemia: Secondary | ICD-10-CM

## 2024-03-21 MED ORDER — FLUDROCORTISONE ACETATE 0.1 MG PO TABS: ORAL_TABLET | ORAL | 3 refills | Status: AC

## 2024-03-21 MED ORDER — EZETIMIBE 10 MG PO TABS: 10.0000 mg | ORAL_TABLET | Freq: Every day | ORAL | 3 refills | Status: AC

## 2024-03-21 MED ORDER — EZETIMIBE 10 MG PO TABS: 10.0000 mg | ORAL_TABLET | Freq: Every day | ORAL | 3 refills | Status: DC

## 2024-03-21 NOTE — Telephone Encounter (Signed)
Refilled Zetia as requested.

## 2024-03-21 NOTE — Addendum Note (Signed)
 Addended by: FREDIRICK BEAU B on: 03/21/2024 05:19 PM   Modules accepted: Orders
